# Patient Record
Sex: Male | Born: 1955
Health system: Southern US, Community
[De-identification: ages and names within clinical notes are randomized; demographics above are authoritative.]

## PROBLEM LIST (undated history)

## (undated) DIAGNOSIS — F319 Bipolar disorder, unspecified: Secondary | ICD-10-CM

## (undated) DIAGNOSIS — R0600 Dyspnea, unspecified: Secondary | ICD-10-CM

## (undated) DIAGNOSIS — G20A1 Parkinson's disease without dyskinesia, without mention of fluctuations: Secondary | ICD-10-CM

## (undated) DIAGNOSIS — K759 Inflammatory liver disease, unspecified: Secondary | ICD-10-CM

## (undated) HISTORY — PX: CLAVICLE SURGERY: SHX598

## (undated) HISTORY — DX: Bipolar disorder, unspecified: F31.9

## (undated) HISTORY — PX: NECK SURGERY: SHX720

## (undated) HISTORY — PX: BACK SURGERY: SHX140

---

## 2000-07-14 ENCOUNTER — Encounter: Payer: Self-pay | Admitting: *Deleted

## 2000-07-14 ENCOUNTER — Ambulatory Visit (HOSPITAL_COMMUNITY): Admission: RE | Admit: 2000-07-14 | Discharge: 2000-07-14 | Payer: Self-pay | Admitting: *Deleted

## 2001-04-14 ENCOUNTER — Encounter: Payer: Self-pay | Admitting: Internal Medicine

## 2001-04-14 ENCOUNTER — Ambulatory Visit (HOSPITAL_COMMUNITY): Admission: RE | Admit: 2001-04-14 | Discharge: 2001-04-14 | Payer: Self-pay | Admitting: Internal Medicine

## 2001-05-03 ENCOUNTER — Encounter: Payer: Self-pay | Admitting: Emergency Medicine

## 2001-05-04 ENCOUNTER — Encounter: Payer: Self-pay | Admitting: Internal Medicine

## 2001-05-04 ENCOUNTER — Observation Stay (HOSPITAL_COMMUNITY): Admission: EM | Admit: 2001-05-04 | Discharge: 2001-05-04 | Payer: Self-pay | Admitting: Internal Medicine

## 2002-07-21 ENCOUNTER — Encounter: Payer: Self-pay | Admitting: Emergency Medicine

## 2002-07-21 ENCOUNTER — Emergency Department (HOSPITAL_COMMUNITY): Admission: AC | Admit: 2002-07-21 | Discharge: 2002-07-21 | Payer: Self-pay

## 2002-08-08 ENCOUNTER — Emergency Department (HOSPITAL_COMMUNITY): Admission: EM | Admit: 2002-08-08 | Discharge: 2002-08-08 | Payer: Self-pay | Admitting: Emergency Medicine

## 2002-09-14 ENCOUNTER — Emergency Department (HOSPITAL_COMMUNITY): Admission: EM | Admit: 2002-09-14 | Discharge: 2002-09-15 | Payer: Self-pay | Admitting: Emergency Medicine

## 2003-12-03 ENCOUNTER — Ambulatory Visit (HOSPITAL_COMMUNITY): Admission: RE | Admit: 2003-12-03 | Discharge: 2003-12-03 | Payer: Self-pay | Admitting: Family Medicine

## 2004-01-01 ENCOUNTER — Ambulatory Visit (HOSPITAL_COMMUNITY): Admission: RE | Admit: 2004-01-01 | Discharge: 2004-01-01 | Payer: Self-pay | Admitting: Unknown Physician Specialty

## 2004-01-28 ENCOUNTER — Encounter (HOSPITAL_COMMUNITY): Admission: RE | Admit: 2004-01-28 | Discharge: 2004-02-27 | Payer: Self-pay | Admitting: Unknown Physician Specialty

## 2005-10-14 ENCOUNTER — Ambulatory Visit (HOSPITAL_COMMUNITY): Admission: RE | Admit: 2005-10-14 | Discharge: 2005-10-15 | Payer: Self-pay | Admitting: Neurosurgery

## 2005-11-17 ENCOUNTER — Encounter (HOSPITAL_COMMUNITY): Admission: RE | Admit: 2005-11-17 | Discharge: 2005-11-20 | Payer: Self-pay | Admitting: Neurosurgery

## 2005-11-22 ENCOUNTER — Encounter (HOSPITAL_COMMUNITY): Admission: RE | Admit: 2005-11-22 | Discharge: 2005-12-22 | Payer: Self-pay | Admitting: Neurosurgery

## 2006-06-28 ENCOUNTER — Ambulatory Visit (HOSPITAL_COMMUNITY): Admission: RE | Admit: 2006-06-28 | Discharge: 2006-06-28 | Payer: Self-pay | Admitting: Neurosurgery

## 2009-08-04 ENCOUNTER — Ambulatory Visit (HOSPITAL_COMMUNITY): Admission: RE | Admit: 2009-08-04 | Discharge: 2009-08-04 | Payer: Self-pay | Admitting: Family Medicine

## 2009-08-28 ENCOUNTER — Ambulatory Visit (HOSPITAL_COMMUNITY): Admission: RE | Admit: 2009-08-28 | Discharge: 2009-08-28 | Payer: Self-pay | Admitting: Internal Medicine

## 2010-03-15 ENCOUNTER — Encounter: Payer: Self-pay | Admitting: Unknown Physician Specialty

## 2010-07-10 NOTE — Op Note (Signed)
NAME:  [name], [name]              ACCOUNT NO.:  [id number]   MEDICAL RECORD NO.:  [id number]          PATIENT TYPE:  AMB   LOCATION:  SDS                          FACILITY:  [facility]   PHYSICIAN:  Reinaldo Meeker, M.D. DATE OF BIRTH:  09-Dec-1955   DATE OF PROCEDURE:  06/28/2006  DATE OF DISCHARGE:                               OPERATIVE REPORT   PREOPERATIVE DIAGNOSIS:  Herniated disk L5-S1 right, recurrent.   POSTOPERATIVE DIAGNOSIS:  Herniated disk L5-S1 right, recurrent.   PROCEDURE:  Right L5-S1 redo microdiskectomy.   SURGEON:  Reinaldo Meeker, M.D.   ASSISTANT:  Dr. Julio Sicks.   PROCEDURE IN DETAIL:  After being placed in the prone position, the  patient's back was prepped and draped in the usual sterile fashion.  Previous lumbar incision was reopened, extended slightly and carried  down the spinous processes.  Subperiosteal dissection was then carried  out on the right-side of spinous process and lamina facet joint, self-  retaining tract was placed for exposure.  X-ray showed approach of the  appropriate level.  Edges of the previous laminotomy were identified and  scar tissue scraped away from the lamina with a straight curette.  The  laminotomy was then enlarged by removing additional segments of the L5  lamina, medial facet joint and the superior one half of the S1 lamina.  The S1 nerve root could be identified going around the pedicle at its  foramen.  Additional scar tissue was then removed.  At this time, the  microscope was draped, brought into the field and used for the remainder  of the case.  Using microsection technique, the pedicle of S1 was  identified and dissection was carried out further where the disk space  could be entered.  A thin straight pituitary was used to remove some of  the initial disk material and then hook and larger pituitaries were  used.  A very large fragment of disk material was then removed and gave  excellent decompression of the  thecal sac and S1 nerve root.  Additional  disk space clean-out was carried out as well as inspection inferior for  any residual fragment, none could be identified.  The disk space was now  thoroughly cleaned out and there was no evidence of residual material  and no evidence of residual compression.  The wound was then irrigated  copiously with antibiotic irrigation.  Any bleeding was controlled with  bipolar coagulation and Gelfoam.  The wound was then closed in multiple  layers of Vicryl in the muscle fascia, subcutaneous and subcuticular  tissues and staples were placed on the skin.  Sterile dressing was then  applied.  The patient was extubated and taken to the recovery room in  stable condition.           ______________________________  Reinaldo Meeker, M.D.     ROK/MEDQ  D:  06/28/2006  T:  06/28/2006  Job:  [id number]

## 2010-07-10 NOTE — Op Note (Signed)
NAME:  Bobby York, Bobby York NO.:  [id number]   MEDICAL RECORD NO.:  [id number]          PATIENT TYPE:  OIB   LOCATION:  3030                         FACILITY:  [facility]   PHYSICIAN:  Reinaldo Meeker, M.D. DATE OF BIRTH:  03-Aug-1955   DATE OF PROCEDURE:  DATE OF DISCHARGE:                                 OPERATIVE REPORT   PREOPERATIVE DIAGNOSIS:  Herniated disk, L5-S1, right with inferior  fragment.   POSTOPERATIVE DIAGNOSIS:  Herniated disk, L5-S1, right with inferior  fragment.   PROCEDURE:  Right L5-S1 interlaminar laminotomy for excision of herniated  disk with operative microscope.   SURGEON:  Reinaldo Meeker, M.D.   ASSISTANT:  Dr. Joycelyn Schmid   PROCEDURE IN DETAIL:  After being placed in the prone position, the  patient's back was prepped and draped in usual sterile fashion.  Localizing  x-rays were taken prior to incision to identify the appropriate level.  A  midline incision was made by the spinous process of L5 and S1.  Using the  Bovie cautery and a curette, the incision was carried down to the spinous  processes.  Subperiosteal dissection was then carried out on the right side  of the spinous processes and laminae, and a self-retaining retractor was  placed for exposure.  X-rays showed approach at the appropriate level.  Using the high-speed drill, the inferior one-third of the L5 lamina and  medial third of the facet joint were removed.  The drill was then used to  remove the superior one-half of the S1 lamina.  Residual bone and ligamentum  flavum were removed in a piecemeal fashion.  The microscope was draped,  brought into the field and used for the remainder of the case.  Using  microdissection technique, the lateral aspect of the thecal sac and S1 nerve  were identified.  Prior to incising the disk, however, it was elected to  explore the axilla of the S1 nerve and large fragments of disk material were  identified, freed up and removed in a  piecemeal fashion.  A very thorough  disk space __________ was then carried out by incising the annulus and  thoroughly cleaning out with pituitary rongeurs and curettes.  Dissection  was then carried out in the axilla once more and any residual disk material  was removed to complete the decompression.  By this time the thecal sac and  the S1 nerve root were completely decompressed, there was no evidence of  residual disk material.  Large amounts of irrigation were carried out at  this time and any bleeding controlled with bipolar coagulation.  The wound  was then closed in multiple layers of Vicryl in the muscle, fascia,  subcutaneous and subcuticular tissues, and staples on the skin.  A sterile  dressing was then applied, the patient was extubated, taken to recovery room  in stable condition.           ______________________________  Reinaldo Meeker, M.D.     ROK/MEDQ  D:  10/14/2005  T:  10/15/2005  Job:  [id number]

## 2010-07-10 NOTE — H&P (Signed)
[place]  Patient:    Bobby York, Bobby York Visit Number: [id number] MRN: [id number]          Service Type: EMS Location: ED Attending Physician:  Herbert Seta Dictated by:   Nathen May, M.D., [facility] Admit Date:  05/03/2001 Discharge Date: 05/03/2001   CC:         Elfredia Nevins, M.D.   History and Physical  HISTORY OF PRESENT ILLNESS:  Kengo Sturges is a 55 year old African-American gentleman referred for further evaluation of chest pain.  Mr. Saye has had a URI syndrome for the last 7-10 days manifested by rhinorrhea, intermittent cough.  He saw his primary physician earlier this week for chest discomfort that had been ongoing on and off for the last week, typically characterized by brief three to five second episodes of pain with full resolution between.  It was felt to be an inflammation of the chest wall (? costochondritis) and was given a sinus medication.  However, his symptoms persisted.  He was seen in the office this morning because of this and was started on Levaquin.  It was noted that his blood pressure was up this week and it had been last week, and he was started on a blood pressure pill which is not known, and he went to work.  However, this evening about 8 p.m. a similar type of pain came and persisted.  It was described as relatively sharp, aggravated by breathing, without radiation.  There is some shortness of breath and diaphoresis.  He then, because of persistence, presented himself to the emergency room.  He was noted to be normotensive.  His electrocardiogram showed a T wave inversion inferiorly and he was referred for evaluation.  He was given nitroglycerin and Lovenox and en route received MS which was the most helpful for his symptoms.  His risk factors are notable for his questionable history of hypertension. There is a strong family history involving his maternal aunts and uncles and grandmother -  although his mother has no known coronary disease.  He smokes cigarettes.  His lipid status is not known.  He also notes that he has had exercise intolerance that is some shortness of breath with the stairs at home over the last couple of months.  He is an admitted ______ so does not have significant exertional complaints.  He denies orthopnea, nocturnal dyspnea, or peripheral edema.  He does have a history of occasional tachypalpitations, episodes lasting about a minute, unassociated with shortness of breath, chest pain, or lightheadedness.  He has no syncope.  PAST MEDICAL HISTORY:  Notable for hepatitis C for which he underwent a liver biopsy this fall.  He is under the care of Dr. Kendell Bane for this.  There is anticipation of some therapy, question Interferon, for this.  There are no other surgeries.  REVIEW OF SYSTEMS:  Is otherwise noncontributory.  SOCIAL HISTORY:  He is married.  He has a 33 year old son.  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  He does not use alcohol or recreational drugs.  He does smoke.  PHYSICAL EXAMINATION:  GENERAL:  He is a middle-aged African-American male, appearing somewhat anxious but in no acute distress.  VITAL SIGNS:  Blood pressure 117/53 and pulse 74, respirations were 16 and unlabored.  HEENT:  Demonstrated no scleral icterus or xanthomata.  The neck veins were flat and the carotids were brisk and full bilaterally without bruits.  There is no lymphadenopathy.  BACK:  Without kyphosis or scoliosis.  LUNGS:  Clear.  CHEST:  The heart sounds were regular without murmurs or gallops.  There was tenderness along the costochondral junction, though this was a different discomfort from his presenting complaint.  ABDOMEN:  Soft with active bowel sounds without hepatomegaly or midline pulsation.  EXTREMITIES:  Femoral pulses were 2+.  Distal pulses were intact.  There was no clubbing, cyanosis, or  edema.  NEUROLOGIC:  Grossly normal.  LABORATORY DATA:  Electrocardiograms obtained at [facility] and here are similar, demonstrating sinus rhythm at 69 with intervals of 0.14/0.08/0.3 with an axis of 75 degrees.  There is a negative to flattened T wave in III which was more prominently negative on the first electrocardiogram, associated with some modest ST segment depression.  IMPRESSION: 1. Atypical chest pain. 2. Abnormal electrocardiogram with transient T wave inversion in lead III and    mild ST segment depression of 0.5 mm in III and F, which improved. 3. Cardiac risk factors notable for question of hypertension, positive family    history, positive cigarettes, question lipids. 4. Upper respiratory infection for the last week or two with sinus drainage    and cough and chest wall tenderness. 5. Mild dyspnea on exertion x1-2 months. 6. Hepatitis C status post liver biopsy with impending therapy.  DISCUSSION:  Mr. Huntsberry has atypical chest pain with a modestly abnormal electrocardiogram in the setting of multiple cardiac risk factors.  However, while this occurs in the setting of a URI/sinus infection, this both makes the overall syndrome less likely to be cardiac on the one hand; however, inflammatory state may be provocative for an acute coronary syndrome.  Given the aforementioned differential, I think an ongoing broad-based approach is necessary, both diagnostically and potentially therapeutically.  We will continue him on his antibiotics.  We will continue him on anti-inflammatory drugs with a modest dose aspirin and continue him on his heparin.  We will obtain serial enzymes and make a disposition regarding further evaluation of his coronary disease based on the results of those.  We will also obtain fasting lipids. Dictated by:   Nathen May, M.D., [facility] Attending Physician:  Herbert Seta DD:  05/04/01 TD:  05/04/01 Job: [id number]

## 2010-12-09 ENCOUNTER — Other Ambulatory Visit (HOSPITAL_COMMUNITY): Payer: Self-pay | Admitting: Physician Assistant

## 2010-12-09 DIAGNOSIS — M25519 Pain in unspecified shoulder: Secondary | ICD-10-CM

## 2010-12-09 DIAGNOSIS — M5030 Other cervical disc degeneration, unspecified cervical region: Secondary | ICD-10-CM

## 2010-12-11 ENCOUNTER — Ambulatory Visit (HOSPITAL_COMMUNITY): Payer: BC Managed Care – PPO

## 2010-12-15 ENCOUNTER — Ambulatory Visit (HOSPITAL_COMMUNITY)
Admission: RE | Admit: 2010-12-15 | Discharge: 2010-12-15 | Disposition: A | Discharge status: Home / Self Care | Payer: BC Managed Care – PPO | Source: Ambulatory Visit | Attending: Physician Assistant | Admitting: Physician Assistant

## 2010-12-15 DIAGNOSIS — M5380 Other specified dorsopathies, site unspecified: Secondary | ICD-10-CM | POA: Insufficient documentation

## 2010-12-15 DIAGNOSIS — M25519 Pain in unspecified shoulder: Secondary | ICD-10-CM

## 2010-12-15 DIAGNOSIS — M542 Cervicalgia: Secondary | ICD-10-CM | POA: Insufficient documentation

## 2010-12-15 DIAGNOSIS — M5030 Other cervical disc degeneration, unspecified cervical region: Secondary | ICD-10-CM | POA: Insufficient documentation

## 2014-04-17 ENCOUNTER — Ambulatory Visit (HOSPITAL_COMMUNITY)
Admission: RE | Admit: 2014-04-17 | Discharge: 2014-04-17 | Disposition: A | Discharge status: Home / Self Care | Payer: BLUE CROSS/BLUE SHIELD | Source: Ambulatory Visit | Attending: Family Medicine | Admitting: Family Medicine

## 2014-04-17 ENCOUNTER — Other Ambulatory Visit (HOSPITAL_COMMUNITY): Payer: Self-pay | Admitting: Family Medicine

## 2014-04-17 DIAGNOSIS — R0789 Other chest pain: Secondary | ICD-10-CM | POA: Insufficient documentation

## 2014-04-17 DIAGNOSIS — R634 Abnormal weight loss: Secondary | ICD-10-CM

## 2014-04-17 DIAGNOSIS — F17200 Nicotine dependence, unspecified, uncomplicated: Secondary | ICD-10-CM

## 2014-05-28 ENCOUNTER — Encounter: Payer: Self-pay | Admitting: Cardiovascular Disease

## 2014-05-28 ENCOUNTER — Ambulatory Visit (INDEPENDENT_AMBULATORY_CARE_PROVIDER_SITE_OTHER): Payer: BLUE CROSS/BLUE SHIELD | Admitting: Cardiovascular Disease

## 2014-05-28 VITALS — BP 126/80 | HR 66 | Ht 71.0 in | Wt 171.0 lb

## 2014-05-28 DIAGNOSIS — Z716 Tobacco abuse counseling: Secondary | ICD-10-CM | POA: Diagnosis not present

## 2014-05-28 DIAGNOSIS — R9431 Abnormal electrocardiogram [ECG] [EKG]: Secondary | ICD-10-CM | POA: Diagnosis not present

## 2014-05-28 NOTE — Progress Notes (Signed)
Patient ID: Bobby York, male   DOB: [birth year], 59 y.o.   MRN: [id number]       CARDIOLOGY CONSULT NOTE  Patient ID: Bobby York MRN: [id number] DOB/AGE: 02-15-1956 59 y.o.  Admit date: (Not on file) Primary Physician Glo Herring., MD  Reason for Consultation: abnormal ECG  HPI: The patient is a 59 year old male with no significant cardiac history who is referred for the evaluation of an ECG which was reportedly abnormal. ECG demonstrates sinus tachycardia with a nonspecific T wave abnormality. He smokes about 3 or 4 cigarettes daily and has done so since the age of 70. He drinks 2 beers every day. He denies exertional chest pain and exertional dyspnea. He also denies palpitations, orthopnea, leg swelling, paroxysmal nocturnal dyspnea, and syncope. His mother passed away at the age of 21 and received a pacemaker in her mid to late 27s. He denies abdominal pain. He occasionally has dysphagia for tablets.    No Known Allergies  Current Outpatient Prescriptions  Medication Sig Dispense Refill  . ALPRAZolam (XANAX) 1 MG tablet Take 1 mg by mouth at bedtime.    . ARIPiprazole (ABILIFY) 2 MG tablet Take 2 mg by mouth daily.    Marland Kitchen buPROPion (WELLBUTRIN XL) 300 MG 24 hr tablet Take 300 mg by mouth daily.    . naltrexone (DEPADE) 50 MG tablet Take 50 mg by mouth daily.     No current facility-administered medications for this visit.    No past medical history on file.  No past surgical history on file.  History   Social History  . Marital Status: Married    Spouse Name: N/A  . Number of Children: N/A  . Years of Education: N/A   Occupational History  . Not on file.   Social History Main Topics  . Smoking status: Current Every Day Smoker -- 0.02 packs/day    Types: Cigarettes    Start date: 05/28/1970  . Smokeless tobacco: Never Used  . Alcohol Use: Not on file  . Drug Use: Not on file  . Sexual Activity: Not on file   Other Topics Concern  . Not on file     Social History Narrative  . No narrative on file     No family history of premature CAD in 1st degree relatives.  Prior to Admission medications   Medication Sig Start Date End Date Taking? Authorizing Provider  ALPRAZolam Duanne Moron) 1 MG tablet Take 1 mg by mouth at bedtime.   Yes Historical Provider, MD  ARIPiprazole (ABILIFY) 2 MG tablet Take 2 mg by mouth daily.   Yes Historical Provider, MD  buPROPion (WELLBUTRIN XL) 300 MG 24 hr tablet Take 300 mg by mouth daily.   Yes Historical Provider, MD  naltrexone (DEPADE) 50 MG tablet Take 50 mg by mouth daily.   Yes Historical Provider, MD     Review of systems complete and found to be negative unless listed above in HPI     Physical exam Blood pressure 126/80, pulse 66, height 5\' 11"  (1.803 m), weight 171 lb (77.565 kg), SpO2 96 %. General: NAD Neck: No JVD, no thyromegaly or thyroid nodule.  Lungs: Clear to auscultation bilaterally with normal respiratory effort. CV: Nondisplaced PMI. Regular rate and rhythm, normal S1/S2, no S3/S4, no murmur.  No peripheral edema.  No carotid bruit.  Normal pedal pulses.  Abdomen: Soft, nontender, no hepatosplenomegaly, no distention.  Skin: Intact without lesions or rashes.  Neurologic: Alert and oriented x 3.  Psych: Normal  affect. Extremities: No clubbing or cyanosis.  HEENT: Normal.   ECG: Most recent ECG reviewed.  Labs:  No results found for: WBC, HGB, HCT, MCV, PLT No results for input(s): NA, K, CL, CO2, BUN, CREATININE, CALCIUM, PROT, BILITOT, ALKPHOS, ALT, AST, GLUCOSE in the last 168 hours.  Invalid input(s): LABALBU No results found for: CKTOTAL, CKMB, CKMBINDEX, TROPONINI No results found for: CHOL No results found for: HDL No results found for: LDLCALC No results found for: TRIG No results found for: CHOLHDL No results found for: LDLDIRECT       Studies: No results found.  ASSESSMENT AND PLAN:  1. Abnormal ECG: Today he is in a regular rhythm with a normal heart  rate. As mentioned previously, the ECG demonstrated sinus tachycardia with a nonspecific T wave abnormality. Given the absence of exertional chest pain and exertional dyspnea with no specific cardiovascular symptoms per se, I do not see any indication to proceed with noninvasive cardiac evaluation. Should he begin to experience symptoms which are concerning for ischemic heart disease, this can be reinvestigated.  2. Tobacco abuse: Cessation counseling provided. His wife agrees.   Signed: Kate Sable, M.D., F.A.C.C.  05/28/2014, 1:27 PM

## 2014-05-28 NOTE — Patient Instructions (Signed)
Your physician recommends that you schedule a follow-up appointment in: only if needed    Thank you for choosing [facility] !

## 2014-06-17 ENCOUNTER — Encounter: Payer: Self-pay | Admitting: Internal Medicine

## 2014-06-19 ENCOUNTER — Emergency Department (HOSPITAL_COMMUNITY)
Admission: EM | Admit: 2014-06-19 | Discharge: 2014-06-19 | Disposition: A | Discharge status: Home / Self Care | Payer: BLUE CROSS/BLUE SHIELD | Attending: Emergency Medicine | Admitting: Emergency Medicine

## 2014-06-19 ENCOUNTER — Emergency Department (HOSPITAL_COMMUNITY): Payer: BLUE CROSS/BLUE SHIELD

## 2014-06-19 ENCOUNTER — Encounter (HOSPITAL_COMMUNITY): Payer: Self-pay | Admitting: Emergency Medicine

## 2014-06-19 DIAGNOSIS — F410 Panic disorder [episodic paroxysmal anxiety] without agoraphobia: Secondary | ICD-10-CM | POA: Diagnosis present

## 2014-06-19 DIAGNOSIS — R079 Chest pain, unspecified: Secondary | ICD-10-CM | POA: Insufficient documentation

## 2014-06-19 DIAGNOSIS — R110 Nausea: Secondary | ICD-10-CM | POA: Insufficient documentation

## 2014-06-19 DIAGNOSIS — Z79899 Other long term (current) drug therapy: Secondary | ICD-10-CM | POA: Insufficient documentation

## 2014-06-19 DIAGNOSIS — Z720 Tobacco use: Secondary | ICD-10-CM | POA: Diagnosis not present

## 2014-06-19 DIAGNOSIS — R0602 Shortness of breath: Secondary | ICD-10-CM | POA: Diagnosis not present

## 2014-06-19 DIAGNOSIS — R42 Dizziness and giddiness: Secondary | ICD-10-CM | POA: Insufficient documentation

## 2014-06-19 LAB — I-STAT CHEM 8, ED
BUN: 13 mg/dL (ref 6–23)
CHLORIDE: 105 mmol/L (ref 96–112)
Calcium, Ion: 1.07 mmol/L — ABNORMAL LOW (ref 1.12–1.23)
Creatinine, Ser: 1.3 mg/dL (ref 0.50–1.35)
Glucose, Bld: 103 mg/dL — ABNORMAL HIGH (ref 70–99)
HEMATOCRIT: 47 % (ref 39.0–52.0)
Hemoglobin: 16 g/dL (ref 13.0–17.0)
POTASSIUM: 4.1 mmol/L (ref 3.5–5.1)
SODIUM: 139 mmol/L (ref 135–145)
TCO2: 21 mmol/L (ref 0–100)

## 2014-06-19 LAB — I-STAT TROPONIN, ED
Troponin i, poc: 0 ng/mL (ref 0.00–0.08)
Troponin i, poc: 0 ng/mL (ref 0.00–0.08)

## 2014-06-19 MED ORDER — ASPIRIN 81 MG PO CHEW
324.0000 mg | CHEWABLE_TABLET | Freq: Once | ORAL | Status: AC
  Administered 2014-06-19: 324 mg via ORAL
  Filled 2014-06-19: qty 4

## 2014-06-19 MED ORDER — LORAZEPAM 1 MG PO TABS
2.0000 mg | ORAL_TABLET | Freq: Once | ORAL | Status: AC
Start: 2014-06-19 — End: 2014-06-19
  Administered 2014-06-19: 2 mg via ORAL
  Filled 2014-06-19: qty 2

## 2014-06-19 NOTE — ED Provider Notes (Signed)
CSN: [id number]     Arrival date & time 06/19/14  1118 History  This chart was scribed for Ripley Fraise, MD by Mercy Moore, ED scribe.  This patient was seen in room APA18/APA18 and the patient's care was started at 12:30 PM.   Chief Complaint  Patient presents with  . Panic Attack   Patient is a 59 y.o. male presenting with anxiety. The history is provided by the patient. No language interpreter was used.  Anxiety Associated symptoms include chest pain and shortness of breath. Pertinent negatives include no abdominal pain.   HPI Comments: Bobby York is a 59 y.o. male who presents to the Emergency Department complaining of shortness of breath since yesterday and reports intermittent, fleeting sharp chest pain since yesterday. Patient reports associated lightheadedness, nausea, and increased anxiety. Patient denies history of similar symptoms. Patient reports history of anxiety and depression; wife reports that patient has been without his medication for two days now.    Patient is an admitted smoker. Patient denies history of PE/DVTs, Diabetes. Patient's wife reports fluctuating blood pressure, but the patient has not been prescribed medication.    PMH - none History  Substance Use Topics  . Smoking status: Current Every Day Smoker -- 0.02 packs/day    Types: Cigarettes    Start date: 05/28/1970  . Smokeless tobacco: Never Used  . Alcohol Use: 0.0 oz/week    0 Standard drinks or equivalent per week    Review of Systems  Constitutional: Negative for fever.  Respiratory: Positive for shortness of breath.   Cardiovascular: Positive for chest pain.  Gastrointestinal: Positive for nausea. Negative for vomiting and abdominal pain.  Neurological: Positive for light-headedness. Negative for syncope.  Psychiatric/Behavioral: The patient is nervous/anxious.   All other systems reviewed and are negative.     Allergies  Review of patient's allergies indicates no known  allergies.  Home Medications   Prior to Admission medications   Medication Sig Start Date End Date Taking? Authorizing Provider  ALPRAZolam Duanne Moron) 1 MG tablet Take 1 mg by mouth at bedtime.   Yes Historical Provider, MD  buPROPion (WELLBUTRIN XL) 300 MG 24 hr tablet Take 300 mg by mouth daily.   Yes Historical Provider, MD  naltrexone (DEPADE) 50 MG tablet Take 50 mg by mouth daily.   Yes Historical Provider, MD   Triage Vitals: BP 152/101 mmHg  Pulse 80  Temp(Src) 98 F (36.7 C) (Oral)  Resp 18  Ht 5\' 11"  (1.803 m)  Wt 172 lb (78.019 kg)  BMI 24.00 kg/m2  SpO2 100% Physical Exam  Nursing note and vitals reviewed. CONSTITUTIONAL: Well developed/well nourished HEAD: Normocephalic/atraumatic EYES: EOMI/PERRL ENMT: Mucous membranes moist NECK: supple no meningeal signs SPINE/BACK:entire spine nontender CV: S1/S2 noted, no murmurs/rubs/gallops noted LUNGS: Lungs are clear to auscultation bilaterally, hyperventilating ABDOMEN: soft, nontender, no rebound or guarding, bowel sounds noted throughout abdomen GU:no cva tenderness NEURO: Pt is awake/alert/appropriate, moves all extremitiesx4.  No facial droop.   EXTREMITIES: pulses normal/equal, full ROM; no calf tenderness or edema SKIN: warm, color normal PSYCH: anxious and tearful   ED Course  Procedures  Medications  aspirin chewable tablet 324 mg (324 mg Oral Given 06/19/14 1243)  LORazepam (ATIVAN) tablet 2 mg (2 mg Oral Given 06/19/14 1243)      COORDINATION OF CARE: 12:35 PM- Discussed treatment plan with patient at bedside and patient agreed to plan.  HEART SCORE - 3 HE IS WELL APPEARING  HE IS IMPROVED INITIALLY SEEMED VERY ANXIOUS - HYPERVENTILATING  CP WAS VERY ATYPICAL EKG UNCHANGED FROM PRIOR REPEAT TROPONIN NEGATIVE HE WOULD LIKE TO GO HOME HE HAS ALREADY SEEN CARDIOLOGY EARLIER THIS MONTH WE DISCUSSED STRICT RETURN PRECAUTIONS  Labs Review Labs Reviewed  I-STAT CHEM 8, ED - Abnormal; Notable for the  following:    Glucose, Bld 103 (*)    Calcium, Ion 1.07 (*)    All other components within normal limits  I-STAT TROPOININ, ED  Randolm Idol, ED    Imaging Review Dg Chest 2 View  06/19/2014   CLINICAL DATA:  Anxiety and difficulty breathing for 1 day. Chest pain.  EXAM: CHEST  2 VIEW  COMPARISON:  April 17, 2014  FINDINGS: Lungs are clear. Heart size and pulmonary vascularity are normal. No adenopathy. No pneumothorax. No bone lesions.  IMPRESSION: No edema or consolidation.   Electronically Signed   By: Lowella Grip III M.D.   On: 06/19/2014 12:21     EKG Interpretation None      MDM   Final diagnoses:  Acute chest pain    Nursing notes including past medical history and social history reviewed and considered in documentation xrays/imaging reviewed by myself and considered during evaluation Labs/vital reviewed myself and considered during evaluation    I personally performed the services described in this documentation, which was scribed in my presence. The recorded information has been reviewed and is accurate.      Ripley Fraise, MD 06/19/14 [phone]

## 2014-06-19 NOTE — Discharge Instructions (Signed)

## 2014-06-19 NOTE — ED Notes (Signed)
Pt reports sob,anixtey since yesterday. Pt denies any pain. Moderate hyperventilating noted in triage.

## 2014-06-19 NOTE — ED Notes (Signed)
Patient transported to X-ray 

## 2014-07-09 ENCOUNTER — Other Ambulatory Visit: Payer: Self-pay

## 2014-07-09 ENCOUNTER — Encounter: Payer: Self-pay | Admitting: Gastroenterology

## 2014-07-09 ENCOUNTER — Encounter (INDEPENDENT_AMBULATORY_CARE_PROVIDER_SITE_OTHER): Payer: Self-pay

## 2014-07-09 ENCOUNTER — Ambulatory Visit (INDEPENDENT_AMBULATORY_CARE_PROVIDER_SITE_OTHER): Payer: BLUE CROSS/BLUE SHIELD | Admitting: Gastroenterology

## 2014-07-09 VITALS — BP 127/93 | HR 75 | Temp 96.2°F | Ht 71.0 in | Wt 167.6 lb

## 2014-07-09 DIAGNOSIS — F1010 Alcohol abuse, uncomplicated: Secondary | ICD-10-CM | POA: Diagnosis not present

## 2014-07-09 DIAGNOSIS — F1011 Alcohol abuse, in remission: Secondary | ICD-10-CM

## 2014-07-09 DIAGNOSIS — R634 Abnormal weight loss: Secondary | ICD-10-CM

## 2014-07-09 DIAGNOSIS — R1314 Dysphagia, pharyngoesophageal phase: Secondary | ICD-10-CM

## 2014-07-09 DIAGNOSIS — R1319 Other dysphagia: Secondary | ICD-10-CM

## 2014-07-09 DIAGNOSIS — B182 Chronic viral hepatitis C: Secondary | ICD-10-CM | POA: Diagnosis not present

## 2014-07-09 DIAGNOSIS — R1310 Dysphagia, unspecified: Secondary | ICD-10-CM

## 2014-07-09 MED ORDER — PEG 3350-KCL-NA BICARB-NACL 420 G PO SOLR: 4000.0000 mL | Freq: Once | ORAL | Status: DC

## 2014-07-09 NOTE — Patient Instructions (Signed)
Colonoscopy and upper endoscopy as scheduled. See separate instructions.   Consume soft foods only until your procedure.   I will review your hepatitis labs once available and we will start process for treatment approval. This can take several weeks.  Call with any medication changes whether prescription or over the counter!
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
## 2014-07-12 NOTE — Assessment & Plan Note (Addendum)
Recent onset dysphagia/odynophagia to pills, food. Unable to swallow most of his pills at this time, which her psychiatric medications. He has placed a phone call to his psychiatrist for further recommendations/alternatives so that he can be medicated. Recommend EGD with dilation with deep sedation in the OR given polypharmacy.  I have discussed the risks, alternatives, benefits with regards to but not limited to the risk of reaction to medication, bleeding, infection, perforation and the patient is agreeable to proceed. Written consent to be obtained.

## 2014-07-12 NOTE — Assessment & Plan Note (Signed)
Weight loss, and 15 pounds over the past one year. No prior colonoscopy. Recommend colonoscopy family is a screening maneuver in the near future. He will also be getting an upper endoscopy for dysphagia. Weight loss also to be evaluated at time of ultrasound the liver to rule out cirrhosis/hepatoma. If no explanation for weight loss as determined with the studies, he may need a CT of the abdomen and pelvis to complete workup from a GI standpoint. He is already had a chest x-ray with no indication of explanation for weight loss.    Colonoscopy with deep sedation due to polypharmacy.  I have discussed the risks, alternatives, benefits with regards to but not limited to the risk of reaction to medication, bleeding, infection, perforation and the patient is agreeable to proceed. Written consent to be obtained.

## 2014-07-12 NOTE — Assessment & Plan Note (Signed)
History of chronic hepatitis C, genotype unknown to me at this point, records from previous workup and treatment unavailable. Received treatment over a decade ago, states he was unable to complete therapy due to side effects. Status of the extent of liver disease is unknown at this time. His LFTs are good.  Discussed with patient. We first need to see his HCV RNA level to confirm that it is positive. If so he will need to have further evaluation prior to getting him approved for HCV treatment. This will include ultrasound with elastography, genotype, PT/INR. Discussed at length with patient and his wife today. Insurance company will not approve people who are actively using illicit drugs and to cannot demonstrate compliance. Recommend he continue to avoid cocaine, he has been abstinent for 2 months now. Continue to encourage cutting back on alcohol consumption, he has a history of previous abuse but has been able to cut back to 2-3 drinks a day. Further recommendations to follow.

## 2014-07-18 NOTE — Progress Notes (Signed)
CC'ED TO PCP 

## 2014-07-19 ENCOUNTER — Encounter (HOSPITAL_COMMUNITY): Payer: Self-pay

## 2014-07-19 ENCOUNTER — Encounter (HOSPITAL_COMMUNITY)
Admission: RE | Admit: 2014-07-19 | Discharge: 2014-07-19 | Disposition: A | Discharge status: Home / Self Care | Payer: BLUE CROSS/BLUE SHIELD | Source: Ambulatory Visit | Attending: Internal Medicine | Admitting: Internal Medicine

## 2014-07-19 DIAGNOSIS — Z01818 Encounter for other preprocedural examination: Secondary | ICD-10-CM | POA: Insufficient documentation

## 2014-07-19 DIAGNOSIS — R1310 Dysphagia, unspecified: Secondary | ICD-10-CM | POA: Diagnosis not present

## 2014-07-19 DIAGNOSIS — R634 Abnormal weight loss: Secondary | ICD-10-CM | POA: Diagnosis not present

## 2014-07-19 HISTORY — DX: Inflammatory liver disease, unspecified: K75.9

## 2014-07-19 LAB — CBC WITH DIFFERENTIAL/PLATELET
BASOS ABS: 0 10*3/uL (ref 0.0–0.1)
Basophils Relative: 0 % (ref 0–1)
EOS PCT: 3 % (ref 0–5)
Eosinophils Absolute: 0.2 10*3/uL (ref 0.0–0.7)
HEMATOCRIT: 44.2 % (ref 39.0–52.0)
Hemoglobin: 14.8 g/dL (ref 13.0–17.0)
Lymphocytes Relative: 41 % (ref 12–46)
Lymphs Abs: 2.3 10*3/uL (ref 0.7–4.0)
MCH: 32 pg (ref 26.0–34.0)
MCHC: 33.5 g/dL (ref 30.0–36.0)
MCV: 95.5 fL (ref 78.0–100.0)
Monocytes Absolute: 0.4 10*3/uL (ref 0.1–1.0)
Monocytes Relative: 7 % (ref 3–12)
NEUTROS PCT: 49 % (ref 43–77)
Neutro Abs: 2.7 10*3/uL (ref 1.7–7.7)
Platelets: 297 10*3/uL (ref 150–400)
RBC: 4.63 MIL/uL (ref 4.22–5.81)
RDW: 12.6 % (ref 11.5–15.5)
WBC: 5.6 10*3/uL (ref 4.0–10.5)

## 2014-07-19 LAB — BASIC METABOLIC PANEL
Anion gap: 10 (ref 5–15)
BUN: 15 mg/dL (ref 6–20)
CALCIUM: 10 mg/dL (ref 8.9–10.3)
CHLORIDE: 100 mmol/L — AB (ref 101–111)
CO2: 28 mmol/L (ref 22–32)
CREATININE: 1.34 mg/dL — AB (ref 0.61–1.24)
GFR calc Af Amer: >60 mL/min (ref >60)
GFR calc non Af Amer: 56 mL/min — ABNORMAL LOW (ref >60)
Glucose, Bld: 93 mg/dL (ref 65–99)
Potassium: 4.6 mmol/L (ref 3.5–5.1)
Sodium: 138 mmol/L (ref 135–145)

## 2014-07-19 NOTE — Pre-Procedure Instructions (Signed)
Patient given information to sign up for my chart at home. 

## 2014-07-19 NOTE — Patient Instructions (Signed)
Bobby York  07/19/2014     @PREFPERIOPPHARMACY @   Your procedure is scheduled on  07/25/2014  Report to [facility] at  1215  A.M.  Call this number if you have problems the morning of surgery:  [phone]   Remember:  Do not eat food or drink liquids after midnight.  Take these medicines the morning of surgery with A SIP OF WATER  Wellbutrin,vistaril, zofran, naltrexone   Do not wear jewelry, make-up or nail polish.  Do not wear lotions, powders, or perfumes.   Do not shave 48 hours prior to surgery.  Men may shave face and neck.  Do not bring valuables to the hospital.  [facility] is not responsible for any belongings or valuables.  Contacts, dentures or bridgework may not be worn into surgery.  Leave your suitcase in the car.  After surgery it may be brought to your room.  For patients admitted to the hospital, discharge time will be determined by your treatment team.  Patients discharged the day of surgery will not be allowed to drive home.   Name and phone number of your driver:   family Special instructions:  Follow instructions from Dr Roseanne Kaufman office.  Please read over the following fact sheets that you were given. Pain Booklet, Coughing and Deep Breathing, Surgical Site Infection Prevention, Anesthesia Post-op Instructions and Care and Recovery After Surgery      Esophageal Dilatation The esophagus is the long, narrow tube which carries food and liquid from the mouth to the stomach. Esophageal dilatation is the technique used to stretch a blocked or narrowed portion of the esophagus. This procedure is used when a part of the esophagus has become so narrow that it becomes difficult, painful or even impossible to swallow. This is generally an uncomplicated form of treatment. When this is not successful, chest surgery may be required. This is a much more extensive form of treatment with a longer recovery time. CAUSES  Some of the more common causes  of blockage or strictures of the esophagus are:  Narrowing from longstanding inflammation (soreness and redness) of the lower esophagus. This comes from the constant exposure of the lower esophagus to the acid which bubbles up from the stomach. Over time this causes scarring and narrowing of the lower esophagus.  Hiatal hernia in which a small part of the stomach bulges (herniates) up through the diaphragm. This can cause a gradual narrowing of the end of the esophagus.  Schatzki ring is a narrow ring of benign (non-cancerous) fibrous tissue which constricts the lower esophagus. The reason for this is not known.  Scleroderma is a connective tissue disorder that affects the esophagus and makes swallowing difficult.  Achalasia is an absence of nerves to the lower esophagus and to the esophageal sphincter. This is the circular muscle between the stomach and esophagus that relaxes to allow food into the stomach. After swallowing, it contracts to keep food in the stomach. This absence of nerves may be congenital (present since birth). This can cause irregular spasms of the lower esophageal muscle. This spasm does not open up to allow food and fluid through. The result is a persistent blockage with subsequent slow trickling of the esophageal contents into the stomach.  Strictures may develop from swallowing materials which damage the esophagus. Some examples are strong acids or alkalis such as lye.  Growths such as benign (non-cancerous) and malignant (cancerous) tumors can block the esophagus.  Hereditary (present since birth) causes. DIAGNOSIS  Your caregiver often suspects this problem by taking a medical history. They will also do a physical exam. They can then prove their suspicions using X-rays and endoscopy. Endoscopy is an exam in which a tube like a small, flexible telescope is used to look at your esophagus.  TREATMENT There are different stretching (dilating) techniques that can be used.  Simple bougie dilatation may be done in the office. This usually takes only a couple minutes. A numbing (anesthetic) spray of the throat is used. Endoscopy, when done, is done in an endoscopy suite under mild sedation. When fluoroscopy is used, the procedure is performed in X-ray. Other techniques require a little longer time. Recovery is usually quick. There is no waiting time to begin eating and drinking to test success of the treatment. Following are some of the methods used. Narrowing of the esophagus is treated by making it bigger. Commonly this is a mechanical problem which can be treated with stretching. This can be done in different ways. Your caregiver will discuss these with you. Some of the means used are:  A series of graduated (increasing thickness) flexible dilators can be used. These are weighted tubes passed through the esophagus into the stomach. The tubes used become progressively larger until the desired stretched size is reached. Graduated dilators are a simple and quick way of opening the esophagus. No visualization is required.  Another method is the use of endoscopy to place a flexible wire across the stricture. The endoscope is removed and the wire left in place. A dilator with a hole through it from end to end is guided down the esophagus and across the stricture. One or more of these dilators are passed over the wire. At the end of the exam, the wire is removed. This type of treatment may be performed in the X-ray department under fluoroscopy. An advantage of this procedure is the examiner is visualizing the end opening in the esophagus.  Stretching of the esophagus may be done using balloons. Deflated balloons are placed through the endoscope and across the stricture. This type of balloon dilatation is often done at the time of endoscopy or fluoroscopy. Flexible endoscopy allows the examiner to directly view the stricture. A balloon is inserted in the deflated form into the area of  narrowing. It is then inflated with air to a certain pressure that is preset for a given circumference. When inflated, it becomes sausage shaped, stretched, and makes the stricture larger.  Achalasia requires a longer, larger balloon-type dilator. This is frequently done under X-ray control. In this situation, the spastic muscle fibers in the lower esophagus are stretched. All of the above procedures make the passage of food and water into the stomach easier. They also make it easier for stomach contents to reflux back into the esophagus. Special medications may be used following the procedure to help prevent further stricturing. Proton-pump inhibitor medications are good at decreasing the amount of acid in the stomach juice. When stomach juice refluxes into the esophagus, the juice is no longer as acidic and is less likely to burn or scar the esophagus. RISKS AND COMPLICATIONS Esophageal dilatation is usually performed effectively and without problems. Some complications that can occur are:  A small amount of bleeding almost always happens where the stretching takes place. If this is too excessive it may require more aggressive treatment.  An uncommon complication is perforation (making a hole) of the esophagus. The esophagus is thin. It is easy  to make a hole in it. If this happens, an operation may be necessary to repair this.  A small, undetected perforation could lead to an infection in the chest. This can be very serious. HOME CARE INSTRUCTIONS   If you received sedation for your procedure, do not drive, make important decisions, or perform any activities requiring your full coordination. Do not drink alcohol, take sedatives, or use any mind altering chemicals unless instructed by your caregiver.  You may use throat lozenges or warm salt water gargles if you have throat discomfort.  You can begin eating and drinking normally on return home unless instructed otherwise. Do not purposely try to  force large chunks of food down to test the benefits of your procedure.  Mild discomfort can be eased with sips of ice water.  Medications for discomfort may or may not be needed. SEEK IMMEDIATE MEDICAL CARE IF:   You begin vomiting up blood.  You develop black, tarry stools.  You develop chills or an unexplained temperature of over 101F (38.3C)  You develop chest or abdominal pain.  You develop shortness of breath, or feel light-headed or faint.  Your swallowing is becoming more painful, difficult, or you are unable to swallow. MAKE SURE YOU:   Understand these instructions.  Will watch your condition.  Will get help right away if you are not doing well or get worse. Document Released: 04/01/2005 Document Revised: 06/25/2013 Document Reviewed: 05/19/2005 [facility] Patient Information 2015 [place], [place]. This information is not intended to replace advice given to you by your health care provider. Make sure you discuss any questions you have with your health care provider. Esophagogastroduodenoscopy Esophagogastroduodenoscopy (EGD) is a procedure to examine the lining of the esophagus, stomach, and first part of the small intestine (duodenum). A long, flexible, lighted tube with a camera attached (endoscope) is inserted down the throat to view these organs. This procedure is done to detect problems or abnormalities, such as inflammation, bleeding, ulcers, or growths, in order to treat them. The procedure lasts about 5-20 minutes. It is usually an outpatient procedure, but it may need to be performed in emergency cases in the hospital. LET YOUR CAREGIVER KNOW ABOUT:   Allergies to food or medicine.  All medicines you are taking, including vitamins, herbs, eyedrops, and over-the-counter medicines and creams.  Use of steroids (by mouth or creams).  Previous problems you or members of your family have had with the use of anesthetics.  Any blood disorders you have.  Previous  surgeries you have had.  Other health problems you have.  Possibility of pregnancy, if this applies. RISKS AND COMPLICATIONS  Generally, EGD is a safe procedure. However, as with any procedure, complications can occur. Possible complications include:  Infection.  Bleeding.  Tearing (perforation) of the esophagus, stomach, or duodenum.  Difficulty breathing or not being able to breath.  Excessive sweating.  Spasms of the larynx.  Slowed heartbeat.  Low blood pressure. BEFORE THE PROCEDURE  Do not eat or drink anything for 6-8 hours before the procedure or as directed by your caregiver.  Ask your caregiver about changing or stopping your regular medicines.  If you wear dentures, be prepared to remove them before the procedure.  Arrange for someone to drive you home after the procedure. PROCEDURE   A vein will be accessed to give medicines and fluids. A medicine to relax you (sedative) and a pain reliever will be given through that access into the vein.  A numbing medicine (local anesthetic) may  be sprayed on your throat for comfort and to stop you from gagging or coughing.  A mouth guard may be placed in your mouth to protect your teeth and to keep you from biting on the endoscope.  You will be asked to lie on your left side.  The endoscope is inserted down your throat and into the esophagus, stomach, and duodenum.  Air is put through the endoscope to allow your caregiver to view the lining of your esophagus clearly.  The esophagus, stomach, and duodenum is then examined. During the exam, your caregiver may:  Remove tissue to be examined under a microscope (biopsy) for inflammation, infection, or other medical problems.  Remove growths.  Remove objects (foreign bodies) that are stuck.  Treat any bleeding with medicines or other devices that stop tissues from bleeding (hot cautery, clipping devices).  Widen (dilate) or stretch narrowed areas of the esophagus and  stomach.  The endoscope will then be withdrawn. AFTER THE PROCEDURE  You will be taken to a recovery area to be monitored. You will be able to go home once you are stable and alert.  Do not eat or drink anything until the local anesthetic and numbing medicines have worn off. You may choke.  It is normal to feel bloated, have pain with swallowing, or have a sore throat for a short time. This will wear off.  Your caregiver should be able to discuss his or her findings with you. It will take longer to discuss the test results if any biopsies were taken. Document Released: 06/11/2004 Document Revised: 06/25/2013 Document Reviewed: 01/12/2012 [facility] Patient Information 2015 [place], [place]. This information is not intended to replace advice given to you by your health care provider. Make sure you discuss any questions you have with your health care provider. Colonoscopy A colonoscopy is an exam to look at the entire large intestine (colon). This exam can help find problems such as tumors, polyps, inflammation, and areas of bleeding. The exam takes about 1 hour.  LET [facility] CARE PROVIDER KNOW ABOUT:   Any allergies you have.  All medicines you are taking, including vitamins, herbs, eye drops, creams, and over-the-counter medicines.  Previous problems you or members of your family have had with the use of anesthetics.  Any blood disorders you have.  Previous surgeries you have had.  Medical conditions you have. RISKS AND COMPLICATIONS  Generally, this is a safe procedure. However, as with any procedure, complications can occur. Possible complications include:  Bleeding.  Tearing or rupture of the colon wall.  Reaction to medicines given during the exam.  Infection (rare). BEFORE THE PROCEDURE   Ask your health care provider about changing or stopping your regular medicines.  You may be prescribed an oral bowel prep. This involves drinking a large amount of medicated liquid,  starting the day before your procedure. The liquid will cause you to have multiple loose stools until your stool is almost clear or light green. This cleans out your colon in preparation for the procedure.  Do not eat or drink anything else once you have started the bowel prep, unless your health care provider tells you it is safe to do so.  Arrange for someone to drive you home after the procedure. PROCEDURE   You will be given medicine to help you relax (sedative).  You will lie on your side with your knees bent.  A long, flexible tube with a light and camera on the end (colonoscope) will be inserted through the  rectum and into the colon. The camera sends video back to a computer screen as it moves through the colon. The colonoscope also releases carbon dioxide gas to inflate the colon. This helps your health care provider see the area better.  During the exam, your health care provider may take a small tissue sample (biopsy) to be examined under a microscope if any abnormalities are found.  The exam is finished when the entire colon has been viewed. AFTER THE PROCEDURE   Do not drive for 24 hours after the exam.  You may have a small amount of blood in your stool.  You may pass moderate amounts of gas and have mild abdominal cramping or bloating. This is caused by the gas used to inflate your colon during the exam.  Ask when your test results will be ready and how you will get your results. Make sure you get your test results. Document Released: 02/06/2000 Document Revised: 11/29/2012 Document Reviewed: 10/16/2012 [facility] Patient Information 2015 [place], [place]. This information is not intended to replace advice given to you by your health care provider. Make sure you discuss any questions you have with your health care provider. PATIENT INSTRUCTIONS POST-ANESTHESIA  IMMEDIATELY FOLLOWING SURGERY:  Do not drive or operate machinery for the first twenty four hours after surgery.  Do  not make any important decisions for twenty four hours after surgery or while taking narcotic pain medications or sedatives.  If you develop intractable nausea and vomiting or a severe headache please notify your doctor immediately.  FOLLOW-UP:  Please make an appointment with your surgeon as instructed. You do not need to follow up with anesthesia unless specifically instructed to do so.  WOUND CARE INSTRUCTIONS (if applicable):  Keep a dry clean dressing on the anesthesia/puncture wound site if there is drainage.  Once the wound has quit draining you may leave it open to air.  Generally you should leave the bandage intact for twenty four hours unless there is drainage.  If the epidural site drains for more than 36-48 hours please call the anesthesia department.  QUESTIONS?:  Please feel free to call your physician or the hospital operator if you have any questions, and they will be happy to assist you.

## 2014-07-25 ENCOUNTER — Ambulatory Visit (HOSPITAL_COMMUNITY)
Admission: RE | Admit: 2014-07-25 | Discharge: 2014-07-25 | Disposition: A | Discharge status: Home / Self Care | Payer: BLUE CROSS/BLUE SHIELD | Source: Ambulatory Visit | Attending: Internal Medicine | Admitting: Internal Medicine

## 2014-07-25 ENCOUNTER — Encounter (HOSPITAL_COMMUNITY): Payer: Self-pay | Admitting: *Deleted

## 2014-07-25 ENCOUNTER — Ambulatory Visit (HOSPITAL_COMMUNITY): Payer: BLUE CROSS/BLUE SHIELD | Admitting: Anesthesiology

## 2014-07-25 ENCOUNTER — Other Ambulatory Visit: Payer: Self-pay

## 2014-07-25 ENCOUNTER — Telehealth: Payer: Self-pay | Admitting: Internal Medicine

## 2014-07-25 ENCOUNTER — Encounter (HOSPITAL_COMMUNITY)
Admission: RE | Disposition: A | Discharge status: Home / Self Care | Payer: Self-pay | Source: Ambulatory Visit | Attending: Internal Medicine

## 2014-07-25 DIAGNOSIS — F319 Bipolar disorder, unspecified: Secondary | ICD-10-CM | POA: Insufficient documentation

## 2014-07-25 DIAGNOSIS — K449 Diaphragmatic hernia without obstruction or gangrene: Secondary | ICD-10-CM | POA: Diagnosis not present

## 2014-07-25 DIAGNOSIS — K620 Anal polyp: Secondary | ICD-10-CM | POA: Diagnosis not present

## 2014-07-25 DIAGNOSIS — K2100 Gastro-esophageal reflux disease with esophagitis, without bleeding: Secondary | ICD-10-CM | POA: Insufficient documentation

## 2014-07-25 DIAGNOSIS — K3189 Other diseases of stomach and duodenum: Secondary | ICD-10-CM | POA: Diagnosis not present

## 2014-07-25 DIAGNOSIS — Z1211 Encounter for screening for malignant neoplasm of colon: Secondary | ICD-10-CM | POA: Insufficient documentation

## 2014-07-25 DIAGNOSIS — R634 Abnormal weight loss: Secondary | ICD-10-CM | POA: Diagnosis present

## 2014-07-25 DIAGNOSIS — K2950 Unspecified chronic gastritis without bleeding: Secondary | ICD-10-CM | POA: Diagnosis not present

## 2014-07-25 DIAGNOSIS — Z86010 Personal history of colonic polyps: Secondary | ICD-10-CM | POA: Insufficient documentation

## 2014-07-25 DIAGNOSIS — K621 Rectal polyp: Secondary | ICD-10-CM | POA: Insufficient documentation

## 2014-07-25 DIAGNOSIS — F1490 Cocaine use, unspecified, uncomplicated: Secondary | ICD-10-CM | POA: Diagnosis not present

## 2014-07-25 DIAGNOSIS — F17210 Nicotine dependence, cigarettes, uncomplicated: Secondary | ICD-10-CM | POA: Insufficient documentation

## 2014-07-25 DIAGNOSIS — R1314 Dysphagia, pharyngoesophageal phase: Secondary | ICD-10-CM

## 2014-07-25 DIAGNOSIS — K7460 Unspecified cirrhosis of liver: Secondary | ICD-10-CM

## 2014-07-25 DIAGNOSIS — R1310 Dysphagia, unspecified: Secondary | ICD-10-CM | POA: Diagnosis present

## 2014-07-25 HISTORY — PX: COLONOSCOPY WITH PROPOFOL: SHX5780

## 2014-07-25 HISTORY — PX: ESOPHAGOGASTRODUODENOSCOPY (EGD) WITH PROPOFOL: SHX5813

## 2014-07-25 HISTORY — PX: ESOPHAGEAL DILATION: SHX303

## 2014-07-25 LAB — RAPID URINE DRUG SCREEN, HOSP PERFORMED
Amphetamines: NOT DETECTED
BARBITURATES: NOT DETECTED
Benzodiazepines: POSITIVE — AB
Cocaine: NOT DETECTED
Opiates: NOT DETECTED
Tetrahydrocannabinol: POSITIVE — AB

## 2014-07-25 SURGERY — COLONOSCOPY WITH PROPOFOL
Anesthesia: Monitor Anesthesia Care | Site: Esophagus

## 2014-07-25 MED ORDER — MIDAZOLAM HCL 2 MG/2ML IJ SOLN
INTRAMUSCULAR | Status: AC
  Filled 2014-07-25: qty 2

## 2014-07-25 MED ORDER — PROPOFOL 10 MG/ML IV BOLUS
INTRAVENOUS | Status: DC | PRN
  Administered 2014-07-25: 20 mg via INTRAVENOUS

## 2014-07-25 MED ORDER — LIDOCAINE VISCOUS 2 % MT SOLN
3.0000 mL | Freq: Once | OROMUCOSAL | Status: AC
  Administered 2014-07-25: 3 mL via OROMUCOSAL

## 2014-07-25 MED ORDER — LACTATED RINGERS IV SOLN
INTRAVENOUS | Status: DC
  Administered 2014-07-25: 12:00:00 via INTRAVENOUS

## 2014-07-25 MED ORDER — LIDOCAINE VISCOUS 2 % MT SOLN
OROMUCOSAL | Status: AC
  Filled 2014-07-25: qty 15

## 2014-07-25 MED ORDER — FENTANYL CITRATE (PF) 100 MCG/2ML IJ SOLN
25.0000 ug | INTRAMUSCULAR | Status: AC
  Administered 2014-07-25 (×2): 25 ug via INTRAVENOUS

## 2014-07-25 MED ORDER — ONDANSETRON HCL 4 MG/2ML IJ SOLN
4.0000 mg | Freq: Once | INTRAMUSCULAR | Status: AC
  Administered 2014-07-25: 4 mg via INTRAVENOUS

## 2014-07-25 MED ORDER — FENTANYL CITRATE (PF) 100 MCG/2ML IJ SOLN: 25.0000 ug | INTRAMUSCULAR | Status: DC | PRN

## 2014-07-25 MED ORDER — MIDAZOLAM HCL 5 MG/5ML IJ SOLN
INTRAMUSCULAR | Status: DC | PRN
  Administered 2014-07-25 (×2): 1 mg via INTRAVENOUS

## 2014-07-25 MED ORDER — PROPOFOL INFUSION 10 MG/ML OPTIME
INTRAVENOUS | Status: DC | PRN
  Administered 2014-07-25: 125 ug/kg/min via INTRAVENOUS
  Administered 2014-07-25: 75 ug/kg/min via INTRAVENOUS

## 2014-07-25 MED ORDER — ONDANSETRON HCL 4 MG/2ML IJ SOLN: 4.0000 mg | Freq: Once | INTRAMUSCULAR | Status: DC | PRN

## 2014-07-25 MED ORDER — FENTANYL CITRATE (PF) 100 MCG/2ML IJ SOLN
INTRAMUSCULAR | Status: AC
  Filled 2014-07-25: qty 2

## 2014-07-25 MED ORDER — ONDANSETRON HCL 4 MG/2ML IJ SOLN
INTRAMUSCULAR | Status: AC
  Filled 2014-07-25: qty 2

## 2014-07-25 MED ORDER — STERILE WATER FOR IRRIGATION IR SOLN
Status: DC | PRN
  Administered 2014-07-25: 1000 mL

## 2014-07-25 MED ORDER — WATER FOR IRRIGATION, STERILE IR SOLN
Status: DC | PRN
  Administered 2014-07-25: 1000 mL via SURGICAL_CAVITY

## 2014-07-25 MED ORDER — MIDAZOLAM HCL 2 MG/2ML IJ SOLN
1.0000 mg | INTRAMUSCULAR | Status: DC | PRN
Start: 2014-07-25 — End: 2014-07-25
  Administered 2014-07-25 (×2): 2 mg via INTRAVENOUS

## 2014-07-25 SURGICAL SUPPLY — 34 items
BALLN CRE LF 10-12 240X5.5 (BALLOONS)
BALLN DILATOR CRE 12-15 240 (BALLOONS)
BALLN DILATOR CRE 15-18 240 (BALLOONS) IMPLANT
BALLN DILATOR CRE 18-20 240 (BALLOONS) IMPLANT
BALLN DILATOR CRE WIREGUIDE (BALLOONS)
BALLOON CRE LF 10-12 240X5.5 (BALLOONS) IMPLANT
BALLOON DILATOR CRE 12-15 240 (BALLOONS) IMPLANT
BALLOON DILATOR CRE WIREGUIDE (BALLOONS) IMPLANT
BLOCK BITE 60FR ADLT L/F BLUE (MISCELLANEOUS) ×1 IMPLANT
DEVICE CLIP HEMOSTAT 235CM (CLIP) IMPLANT
ELECT REM PT RETURN 9FT ADLT (ELECTROSURGICAL) ×3
ELECTRODE REM PT RTRN 9FT ADLT (ELECTROSURGICAL) IMPLANT
FCP BXJMBJMB 240X2.8X (CUTTING FORCEPS)
FLOOR PAD 36X40 (MISCELLANEOUS) ×3
FORCEPS BIOP RAD 4 LRG CAP 4 (CUTTING FORCEPS) ×1 IMPLANT
FORCEPS BIOP RJ4 240 W/NDL (CUTTING FORCEPS)
FORCEPS BXJMBJMB 240X2.8X (CUTTING FORCEPS) IMPLANT
FORMALIN 10 PREFIL 20ML (MISCELLANEOUS) ×2 IMPLANT
INJECTOR/SNARE I SNARE (MISCELLANEOUS) IMPLANT
KIT ENDO PROCEDURE PEN (KITS) ×3 IMPLANT
MANIFOLD NEPTUNE II (INSTRUMENTS) ×1 IMPLANT
NDL SCLEROTHERAPY 25GX240 (NEEDLE) IMPLANT
NEEDLE SCLEROTHERAPY 25GX240 (NEEDLE) IMPLANT
PAD FLOOR 36X40 (MISCELLANEOUS) IMPLANT
PROBE APC STR FIRE (PROBE) IMPLANT
PROBE INJECTION GOLD (MISCELLANEOUS)
PROBE INJECTION GOLD 7FR (MISCELLANEOUS) IMPLANT
SNARE ROTATE MED OVAL 20MM (MISCELLANEOUS) IMPLANT
SNARE SHORT THROW 13M SML OVAL (MISCELLANEOUS) ×3 IMPLANT
SYR INFLATE BILIARY GAUGE (MISCELLANEOUS) IMPLANT
SYR INFLATION 60ML (SYRINGE) ×2 IMPLANT
TRAP SPECIMEN MUCOUS 40CC (MISCELLANEOUS) IMPLANT
TUBING IRRIGATION ENDOGATOR (MISCELLANEOUS) ×1 IMPLANT
WATER STERILE IRR 1000ML POUR (IV SOLUTION) ×1 IMPLANT

## 2014-07-25 NOTE — OR Nursing (Signed)
Dr. Patsey Berthold notified of DBP being 104-108 consistently after sedation. No orders received and said to proceed with procedure.

## 2014-07-25 NOTE — Anesthesia Postprocedure Evaluation (Signed)
  Anesthesia Post-op Note  Patient: Bobby York  Procedure(s) Performed: Procedure(s) with comments: COLONOSCOPY WITH PROPOFOL (N/A) - In cecum @ 1300, out @ 1309 ESOPHAGOGASTRODUODENOSCOPY (EGD) WITH PROPOFOL (N/A) ESOPHAGEAL DILATION (N/A) - Malony dilators # 56,  Patient Location: PACU  Anesthesia Type:MAC  Level of Consciousness: awake, alert  and oriented  Airway and Oxygen Therapy: Patient Spontanous Breathing  Post-op Pain: none  Post-op Assessment: Post-op Vital signs reviewed, Patient's Cardiovascular Status Stable, Respiratory Function Stable, Patent Airway and No signs of Nausea or vomiting  Post-op Vital Signs: Reviewed and stable  Last Vitals:  Filed Vitals:   07/25/14 1342  BP: 138/85  Pulse: 73  Temp:   Resp: 22    Complications: No apparent anesthesia complications
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
## 2014-07-25 NOTE — Telephone Encounter (Signed)
Called pt insurance for approval for CT abd/pelvis per RMR recommendation.   Case was sent to review and they are requesting a peer to peer.   The number to call is [phone]  Case # is [id number]  Routing to Neil Crouch PA since she seen patient at last office visit.

## 2014-07-25 NOTE — Transfer of Care (Signed)
Immediate Anesthesia Transfer of Care Note  Patient: Bobby York  Procedure(s) Performed: Procedure(s) with comments: COLONOSCOPY WITH PROPOFOL (N/A) - In cecum @ 1300, out @ 1309 ESOPHAGOGASTRODUODENOSCOPY (EGD) WITH PROPOFOL (N/A) ESOPHAGEAL DILATION (N/A) - Malony dilators # 56,  Patient Location: PACU  Anesthesia Type:MAC  Level of Consciousness: awake, alert  and oriented  Airway & Oxygen Therapy: Patient Spontanous Breathing  Post-op Assessment: Report given to RN  Post vital signs: Reviewed and stable  Last Vitals:  Filed Vitals:   07/25/14 1215  BP: 149/107  Pulse:   Resp: 26    Complications: No apparent anesthesia complications

## 2014-07-25 NOTE — Anesthesia Preprocedure Evaluation (Signed)
Anesthesia Evaluation  Patient identified by MRN, date of birth, ID band Patient awake    Reviewed: Allergy & Precautions, NPO status , Patient's Chart, lab work & pertinent test results  Airway Mallampati: I  TM Distance: >3 FB     Dental  (+) Edentulous Lower   Pulmonary Current Smoker,  breath sounds clear to auscultation        Cardiovascular negative cardio ROS  Rhythm:Regular Rate:Normal     Neuro/Psych PSYCHIATRIC DISORDERS Bipolar Disorder    GI/Hepatic (+)     substance abuse  alcohol use and cocaine use, Hepatitis -, C  Endo/Other    Renal/GU      Musculoskeletal   Abdominal   Peds  Hematology   Anesthesia Other Findings   Reproductive/Obstetrics                             Anesthesia Physical Anesthesia Plan  ASA: III  Anesthesia Plan: MAC   Post-op Pain Management:    Induction: Intravenous  Airway Management Planned: Simple Face Mask  Additional Equipment:   Intra-op Plan:   Post-operative Plan:   Informed Consent: I have reviewed the patients History and Physical, chart, labs and discussed the procedure including the risks, benefits and alternatives for the proposed anesthesia with the patient or authorized representative who has indicated his/her understanding and acceptance.     Plan Discussed with:   Anesthesia Plan Comments:         Anesthesia Quick Evaluation

## 2014-07-25 NOTE — Telephone Encounter (Signed)
Hospital called to schedule FU OV in 4-6 weeks and is aware of OV on 6/30 at 930 with EG and also said patient needed to be scheduled for a CT abd/pelvis to further evaluate wt loss with chronic Hep C

## 2014-07-25 NOTE — Op Note (Signed)
[facility] [address] [place], [address]   COLONOSCOPY PROCEDURE REPORT  PATIENT: Bobby York, Bobby York  MR#: [id number] BIRTHDATE: 11-16-1955 , 35  yrs. old GENDER: male ENDOSCOPIST: R.  Garfield Cornea, MD FACP [facility] REFERRED YB:[facility] Gerarda Fraction, M.D. PROCEDURE DATE:  2014/08/13 PROCEDURE:   Colonoscopy with biopsy INDICATIONS:First ever average risk colorectal cancer screening examination. MEDICATIONS: Deep sedation per Dr.  Patsey Berthold and associates. ASA CLASS:       Class III  CONSENT: The risks, benefits, alternatives and imponderables including but not limited to bleeding, perforation as well as the possibility of a missed lesion have been reviewed.  The potential for biopsy, lesion removal, etc. have also been discussed. Questions have been answered.  All parties agreeable.  Please see the history and physical in the medical record for more information.  DESCRIPTION OF PROCEDURE:   After the risks benefits and alternatives of the procedure were thoroughly explained, informed consent was obtained.  The digital rectal exam revealed no abnormalities of the rectum.   The     endoscope was introduced through the anus and advanced to the cecum, which was identified by both the appendix and ileocecal valve. No adverse events experienced.   The quality of the prep was adequate  The instrument was then slowly withdrawn as the colon was fully examined.      COLON FINDINGS: (1) diminutive polyp at 10 cm from the anal verge; otherwise, the remainder of the colonic mucosa appeared normal. The colonic mucosa appeared normal.  The above polyp was cold biopsy/removed.  Retroflexion was performed. .  Withdrawal time=10 minutes 0 seconds.  The scope was withdrawn and the procedure completed. COMPLICATIONS: There were no immediate complications.  ENDOSCOPIC IMPRESSION: Single rectal polyp?"removed as described above. Otherwise  negative colonoscopy  RECOMMENDATIONS: Follow-up on pathology. See EGD report. Proceed with abdominal pelvic CT with contrast to further evaluate weight loss in a patient with chronic hepatitis C infection.  eSigned:  R. Garfield Cornea, MD Rosalita Chessman [facility] August 13, 2014 1:25 PM   cc:  CPT CODES: ICD CODES:  The ICD and CPT codes recommended by this software are interpretations from the data that the clinical staff has captured with the software.  The verification of the translation of this report to the ICD and CPT codes and modifiers is the sole responsibility of the health care institution and practicing physician where this report was generated.  [employer] will not be held responsible for the validity of the ICD and CPT codes included on this report.  AMA assumes no liability for data contained or not contained herein. CPT is a Designer, television/film set of the [employer].

## 2014-07-25 NOTE — Telephone Encounter (Signed)
What diagnosis did you use for reason for CT?

## 2014-07-25 NOTE — Discharge Instructions (Signed)
Gastroesophageal Reflux Disease, Adult Gastroesophageal reflux disease (GERD) happens when acid from your stomach goes into your food pipe (esophagus). The acid can cause a burning feeling in your chest. Over time, the acid can make small holes (ulcers) in your food pipe.  HOME CARE  Ask your doctor for advice about:  Losing weight.  Quitting smoking.  Alcohol use.  Avoid foods and drinks that make your problems worse. You may want to avoid:  Caffeine and alcohol.  Chocolate.  Mints.  Garlic and onions.  Spicy foods.  Citrus fruits, such as oranges, lemons, or limes.  Foods that contain tomato, such as sauce, chili, salsa, and pizza.  Fried and fatty foods.  Avoid lying down for 3 hours before you go to bed or before you take a nap.  Eat small meals often, instead of large meals.  Wear loose-fitting clothing. Do not wear anything tight around your waist.  Raise (elevate) the head of your bed 6 to 8 inches with wood blocks. Using extra pillows does not help.  Only take medicines as told by your doctor.  Do not take aspirin or ibuprofen. GET HELP RIGHT AWAY IF:   You have pain in your arms, neck, jaw, teeth, or back.  Your pain gets worse or changes.  You feel sick to your stomach (nauseous), throw up (vomit), or sweat (diaphoresis).  You feel short of breath, or you pass out (faint).  Your throw up is green, yellow, black, or looks like coffee grounds or blood.  Your poop (stool) is red, bloody, or black. MAKE SURE YOU:   Understand these instructions.  Will watch your condition.  Will get help right away if you are not doing well or get worse. Document Released: 07/28/2007 Document Revised: 05/03/2011 Document Reviewed: 08/28/2010 [facility] Patient Information 2015 [place], [place]. This information is not intended to replace advice given to you by your health care provider. Make sure you discuss any questions you have with your health care provider. Food  Choices for Gastroesophageal Reflux Disease When you have gastroesophageal reflux disease (GERD), the foods you eat and your eating habits are very important. Choosing the right foods can help ease the discomfort of GERD. WHAT GENERAL GUIDELINES DO I NEED TO FOLLOW?  Choose fruits, vegetables, whole grains, low-fat dairy products, and low-fat meat, fish, and poultry.  Limit fats such as oils, salad dressings, butter, nuts, and avocado.  Keep a food diary to identify foods that cause symptoms.  Avoid foods that cause reflux. These may be different for different people.  Eat frequent small meals instead of three large meals each day.  Eat your meals slowly, in a relaxed setting.  Limit fried foods.  Cook foods using methods other than frying.  Avoid drinking alcohol.  Avoid drinking large amounts of liquids with your meals.  Avoid bending over or lying down until 2-3 hours after eating. WHAT FOODS ARE NOT RECOMMENDED? The following are some foods and drinks that may worsen your symptoms: Vegetables Tomatoes. Tomato juice. Tomato and spaghetti sauce. Chili peppers. Onion and garlic. Horseradish. Fruits Oranges, grapefruit, and lemon (fruit and juice). Meats High-fat meats, fish, and poultry. This includes hot dogs, ribs, ham, sausage, salami, and bacon. Dairy Whole milk and chocolate milk. Sour cream. Cream. Butter. Ice cream. Cream cheese.  Beverages Coffee and tea, with or without caffeine. Carbonated beverages or energy drinks. Condiments Hot sauce. Barbecue sauce.  Sweets/Desserts Chocolate and cocoa. Donuts. Peppermint and spearmint. Fats and Oils High-fat foods, including [place] fries and  potato chips. Other Vinegar. Strong spices, such as black pepper, white pepper, red pepper, cayenne, curry powder, cloves, ginger, and chili powder. The items listed above may not be a complete list of foods and beverages to avoid. Contact your dietitian for more  information. Document Released: 02/08/2005 Document Revised: 02/13/2013 Document Reviewed: 12/13/2012 [facility] Patient Information 2015 [place], [place]. This information is not intended to replace advice given to you by your health care provider. Make sure you discuss any questions you have with your health care provider. Colon Polyps Polyps are lumps of extra tissue growing inside the body. Polyps can grow in the large intestine (colon). Most colon polyps are noncancerous (benign). However, some colon polyps can become cancerous over time. Polyps that are larger than a pea may be harmful. To be safe, caregivers remove and test all polyps. CAUSES  Polyps form when mutations in the genes cause your cells to grow and divide even though no more tissue is needed. RISK FACTORS There are a number of risk factors that can increase your chances of getting colon polyps. They include:  Being older than 50 years.  Family history of colon polyps or colon cancer.  Long-term colon diseases, such as colitis or Crohn disease.  Being overweight.  Smoking.  Being inactive.  Drinking too much alcohol. SYMPTOMS  Most small polyps do not cause symptoms. If symptoms are present, they may include:  Blood in the stool. The stool may look dark red or black.  Constipation or diarrhea that lasts longer than 1 week. DIAGNOSIS People often do not know they have polyps until their caregiver finds them during a regular checkup. Your caregiver can use 4 tests to check for polyps:  Digital rectal exam. The caregiver wears gloves and feels inside the rectum. This test would find polyps only in the rectum.  Barium enema. The caregiver puts a liquid called barium into your rectum before taking X-rays of your colon. Barium makes your colon look white. Polyps are dark, so they are easy to see in the X-ray pictures.  Sigmoidoscopy. A thin, flexible tube (sigmoidoscope) is placed into your rectum. The sigmoidoscope has  a light and tiny camera in it. The caregiver uses the sigmoidoscope to look at the last third of your colon.  Colonoscopy. This test is like sigmoidoscopy, but the caregiver looks at the entire colon. This is the most common method for finding and removing polyps. TREATMENT  Any polyps will be removed during a sigmoidoscopy or colonoscopy. The polyps are then tested for cancer. PREVENTION  To help lower your risk of getting more colon polyps:  Eat plenty of fruits and vegetables. Avoid eating fatty foods.  Do not smoke.  Avoid drinking alcohol.  Exercise every day.  Lose weight if recommended by your caregiver.  Eat plenty of calcium and folate. Foods that are rich in calcium include milk, cheese, and broccoli. Foods that are rich in folate include chickpeas, kidney beans, and spinach. HOME CARE INSTRUCTIONS Keep all follow-up appointments as directed by your caregiver. You may need periodic exams to check for polyps. SEEK MEDICAL CARE IF: You notice bleeding during a bowel movement. Document Released: 11/05/2003 Document Revised: 05/03/2011 Document Reviewed: 04/20/2011 [facility] Patient Information 2015 [place], [place]. This information is not intended to replace advice given to you by your health care provider. Make sure you discuss any questions you have with your health care provider.  Colonoscopy Discharge Instructions  Read the instructions outlined below and refer to this sheet in the next  few weeks. These discharge instructions provide you with general information on caring for yourself after you leave the hospital. Your doctor may also give you specific instructions. While your treatment has been planned according to the most current medical practices available, unavoidable complications occasionally occur. If you have any problems or questions after discharge, call Dr. Gala Romney at [phone]. ACTIVITY  You may resume your regular activity, but move at a slower pace for the next  24 hours.   Take frequent rest periods for the next 24 hours.   Walking will help get rid of the air and reduce the bloated feeling in your belly (abdomen).   No driving for 24 hours (because of the medicine (anesthesia) used during the test).    Do not sign any important legal documents or operate any machinery for 24 hours (because of the anesthesia used during the test).  NUTRITION  Drink plenty of fluids.   You may resume your normal diet as instructed by your doctor.   Begin with a light meal and progress to your normal diet. Heavy or fried foods are harder to digest and may make you feel sick to your stomach (nauseated).   Avoid alcoholic beverages for 24 hours or as instructed.  MEDICATIONS  You may resume your normal medications unless your doctor tells you otherwise.  WHAT YOU CAN EXPECT TODAY  Some feelings of bloating in the abdomen.   Passage of more gas than usual.   Spotting of blood in your stool or on the toilet paper.  IF YOU HAD POLYPS REMOVED DURING THE COLONOSCOPY:  No aspirin products for 7 days or as instructed.   No alcohol for 7 days or as instructed.   Eat a soft diet for the next 24 hours.  FINDING OUT THE RESULTS OF YOUR TEST Not all test results are available during your visit. If your test results are not back during the visit, make an appointment with your caregiver to find out the results. Do not assume everything is normal if you have not heard from your caregiver or the medical facility. It is important for you to follow up on all of your test results.  SEEK IMMEDIATE MEDICAL ATTENTION IF:  You have more than a spotting of blood in your stool.   Your belly is swollen (abdominal distention).   You are nauseated or vomiting.   You have a temperature over 101.  You have abdominal pain or discomfort that is severe or gets worse throughout the day. EGD Discharge instructions Please read the instructions outlined below and refer to this sheet  in the next few weeks. These discharge instructions provide you with general information on caring for yourself after you leave the hospital. Your doctor may also give you specific instructions. While your treatment has been planned according to the most current medical practices available, unavoidable complications occasionally occur. If you have any problems or questions after discharge, please call your doctor. ACTIVITY You may resume your regular activity but move at a slower pace for the next 24 hours.  Take frequent rest periods for the next 24 hours.  Walking will help expel (get rid of) the air and reduce the bloated feeling in your abdomen.  No driving for 24 hours (because of the anesthesia (medicine) used during the test).  You may shower.  Do not sign any important legal documents or operate any machinery for 24 hours (because of the anesthesia used during the test).  NUTRITION Drink plenty of fluids.  You may resume your normal diet.  Begin with a light meal and progress to your normal diet.  Avoid alcoholic beverages for 24 hours or as instructed by your caregiver.  MEDICATIONS You may resume your normal medications unless your caregiver tells you otherwise.  WHAT YOU CAN EXPECT TODAY You may experience abdominal discomfort such as a feeling of fullness or gas pains.  FOLLOW-UP Your doctor will discuss the results of your test with you.  SEEK IMMEDIATE MEDICAL ATTENTION IF ANY OF THE FOLLOWING OCCUR: Excessive nausea (feeling sick to your stomach) and/or vomiting.  Severe abdominal pain and distention (swelling).  Trouble swallowing.  Temperature over 101 F (37.8 C).  Rectal bleeding or vomiting of blood.     GERD information provided  Begin Protonix 40 mg daily  Polyp information provided  Contrast CT of the abdomen and pelvis to further evaluate weight loss in a patient with chronic hepatitis C infection  Further recommendations to follow pending review of  pathology report  Office visit with [place] in 4-6 weeks

## 2014-07-25 NOTE — Op Note (Signed)
[facility] [address] [place], [address]   ENDOSCOPY PROCEDURE REPORT  PATIENT: Bobby York, Bobby York  MR#: [id number] BIRTHDATE: [birth year] , 48  yrs. old GENDER: male ENDOSCOPIST: R.  Garfield Cornea, MD FACP [facility] REFERRED BY:  Kerin Perna, M.D. PROCEDURE DATE:  August 07, 2014 PROCEDURE:  EGD w/ biopsy and Maloney dilation INDICATIONS:  Esophageal dysphagia.; Weight loss MEDICATIONS: Deep sedation per Dr.  [facility]sey [facility] ASA CLASS:      Class III  CONSENT: The risks, benefits, limitations, alternatives and imponderables have been discussed.  The potential for biopsy, esophogeal dilation, etc. have also been reviewed.  Questions have been answered.  All parties agreeable.  Please see the history and physical in the medical record for more information.  DESCRIPTION OF PROCEDURE: After the risks benefits and alternatives of the procedure were thoroughly explained, informed consent was obtained.  The    endoscope was introduced through the mouth and advanced to the second portion of the duodenum , limited by Without limitations. The instrument was slowly withdrawn as the mucosa was fully examined.    Circumferential distal esophageal erosions within 3 mm of the GE junction.  The tubular esophagus appeared patent throughout its course.  No tumor.  No Barrett's esophagus seen.  Stomach empty. Small hiatal hernia.  Multiple areas of erosion on the greater curvature and antral erosions.  No ulcer or infiltrating process. Pylorus patent.  Examination the first and second portion of the duodenum revealed no abnormalities.  A 56 [place] Venia Minks was passed to full insertion easily.  A look back revealed no apparent complication related to this maneuver. Subsequently, biopsies of the abnormal gastric mucosa taken for histologic study.  There was no evidence of esophageal varices or portal gastropathy. Retroflexed views revealed a hiatal hernia.     The scope was  then withdrawn from the patient and the procedure completed.  COMPLICATIONS: There were no immediate complications.  ENDOSCOPIC IMPRESSION: Mild erosive reflux esophagitis. Status post passage of a Maloney dilator. Hiatal hernia. Gastric erosions?"status post biopsy  RECOMMENDATIONS: Begin Protonix 40 mg daily. Follow-up pathology. See colonoscopy report.  REPEAT EXAM:  eSigned:  R. Garfield Cornea, MD Rosalita Chessman [facility] Aug 07, 2014 1:21 PM    CC:  CPT CODES: ICD CODES:  The ICD and CPT codes recommended by this software are interpretations from the data that the clinical staff has captured with the software.  The verification of the translation of this report to the ICD and CPT codes and modifiers is the sole responsibility of the health care institution and practicing physician where this report was generated.  [employer] will not be held responsible for the validity of the ICD and CPT codes included on this report.  AMA assumes no liability for data contained or not contained herein. CPT is a Designer, television/film set of the [employer].  PATIENT NAME:  Bobby York, Bobby York MR#: [id number]

## 2014-07-25 NOTE — Interval H&P Note (Signed)
History and Physical Interval Note:  07/25/2014 12:15 PM  Bobby York  has presented today for surgery, with the diagnosis of weight loss, dysphagia, HCV  The various methods of treatment have been discussed with the patient and family. After consideration of risks, benefits and other options for treatment, the patient has consented to  Procedure(s) with comments: COLONOSCOPY WITH PROPOFOL (N/A) - 1345 ESOPHAGOGASTRODUODENOSCOPY (EGD) WITH PROPOFOL (N/A) ESOPHAGEAL DILATION (N/A) as a surgical intervention .  The patient's history has been reviewed, patient examined, no change in status, stable for surgery.  I have reviewed the patient's chart and labs.  Questions were answered to the patient's satisfaction.     Karim Aiello  No change. EGD with esophageal dilation as feasible/appropriate and screening colonoscopy today per plan.  The risks, benefits, limitations, imponderables and alternatives regarding both EGD and colonoscopy have been reviewed with the patient. Questions have been answered. All parties agreeable.

## 2014-07-25 NOTE — Progress Notes (Signed)
Bobby York unable to void for urine drug screen.  Dr Patsey Berthold notified.  Ordered in and cath for urine collection. In and out cath at 11:30 am.  7ml dark colored urine.

## 2014-07-26 ENCOUNTER — Encounter (HOSPITAL_COMMUNITY): Payer: Self-pay | Admitting: Internal Medicine

## 2014-07-26 NOTE — Telephone Encounter (Signed)
Pt is aware of appointment 

## 2014-07-26 NOTE — Telephone Encounter (Signed)
Called Pt and LMOM regarding CT appt. CT is set for 07/31/2014.  Pt needs to be there at 0745am.   LMOM to pick up contrast

## 2014-07-26 NOTE — Telephone Encounter (Signed)
Weight loss and hep C

## 2014-07-26 NOTE — Telephone Encounter (Signed)
noted 

## 2014-07-26 NOTE — Telephone Encounter (Signed)
CT A/P with contrast Authorization code [id number]. Good for 30 days from 07/25/14.

## 2014-07-29 ENCOUNTER — Encounter: Payer: Self-pay | Admitting: Internal Medicine

## 2014-07-30 ENCOUNTER — Telehealth: Payer: Self-pay

## 2014-07-30 NOTE — Telephone Encounter (Signed)
Per RMR- Needs Prevpac 14 days; have patient decrease dose of Xanax to 1/2 mg at bedtime during therapy because Prevpac can increase the effect of Xanax. Likewise, decrease dose of Wellbutrin to 150 mg XR daily times the 2 weeks on Prevpac therapy. Okay to call in a prescription for Wellbutrin 150 mg daily for 2 weeks.of therapy. No refills. Resume usual medication dosings after Prevpac completed.

## 2014-07-31 ENCOUNTER — Ambulatory Visit (HOSPITAL_COMMUNITY)
Admission: RE | Admit: 2014-07-31 | Discharge: 2014-07-31 | Disposition: A | Discharge status: Home / Self Care | Payer: BLUE CROSS/BLUE SHIELD | Source: Ambulatory Visit | Attending: Internal Medicine | Admitting: Internal Medicine

## 2014-07-31 DIAGNOSIS — K5900 Constipation, unspecified: Secondary | ICD-10-CM | POA: Diagnosis present

## 2014-07-31 DIAGNOSIS — K7460 Unspecified cirrhosis of liver: Secondary | ICD-10-CM

## 2014-07-31 DIAGNOSIS — R634 Abnormal weight loss: Secondary | ICD-10-CM | POA: Diagnosis not present

## 2014-07-31 MED ORDER — IOHEXOL 300 MG/ML  SOLN
100.0000 mL | Freq: Once | INTRAMUSCULAR | Status: AC | PRN
  Administered 2014-07-31: 100 mL via INTRAVENOUS

## 2014-08-01 MED ORDER — AMOXICILL-CLARITHRO-LANSOPRAZ PO MISC: Freq: Two times a day (BID) | ORAL | Status: DC

## 2014-08-01 NOTE — Progress Notes (Signed)
Quick Note:  Save for further RMR review and recommendations.  Small right benign adrenal adenoma. Nothing to explain weight loss and constipation. ______

## 2014-08-01 NOTE — Telephone Encounter (Signed)
Pt is aware. We went over all his medications. He knows how to adjust his xanax and wellbutrin, he said he is able to cut his Wellbutrin 300mg  in half and will not need a new rx sent in. He will also stop his protonix while on the prevpac. I answered all his questions. prevpac rx has been sent to the pharmacy.

## 2014-08-08 ENCOUNTER — Telehealth: Payer: Self-pay | Admitting: Gastroenterology

## 2014-08-08 DIAGNOSIS — R7989 Other specified abnormal findings of blood chemistry: Secondary | ICD-10-CM

## 2014-08-08 DIAGNOSIS — R945 Abnormal results of liver function studies: Secondary | ICD-10-CM

## 2014-08-08 NOTE — Telephone Encounter (Signed)
Bobby York, I'm still waiting for the HCV RNA lab result from PCP. Can you request again? Thanks!

## 2014-08-09 NOTE — Telephone Encounter (Signed)
Labs received from PCP again say that the HCV was not done due to insufficient specimen. We have requested couple of times.  At this point will request patient to go for updated labs, orders placed.  HCV RNA with genotype CMET, PT/INR

## 2014-08-09 NOTE — Telephone Encounter (Signed)
I requested records again this morning.

## 2014-08-12 NOTE — Telephone Encounter (Signed)
Lab orders and letter mailed to the pt.

## 2014-08-15 LAB — PROTIME-INR
INR: 0.99 (ref <1.50)
Prothrombin Time: 13.1 seconds (ref 11.6–15.2)

## 2014-08-15 LAB — COMPREHENSIVE METABOLIC PANEL
ALT: 25 U/L (ref 0–53)
AST: 28 U/L (ref 0–37)
Albumin: 4.3 g/dL (ref 3.5–5.2)
Alkaline Phosphatase: 59 U/L (ref 39–117)
BILIRUBIN TOTAL: 0.7 mg/dL (ref 0.2–1.2)
BUN: 15 mg/dL (ref 6–23)
CO2: 26 mEq/L (ref 19–32)
CREATININE: 1.13 mg/dL (ref 0.50–1.35)
Calcium: 9.6 mg/dL (ref 8.4–10.5)
Chloride: 105 mEq/L (ref 96–112)
GLUCOSE: 93 mg/dL (ref 70–99)
Potassium: 4.2 mEq/L (ref 3.5–5.3)
Sodium: 141 mEq/L (ref 135–145)
Total Protein: 6.9 g/dL (ref 6.0–8.3)

## 2014-08-15 LAB — HCV RNA QUANT RFLX ULTRA OR GENOTYP
HCV QUANT: 626871 [IU]/mL — AB (ref <15)
HCV Quantitative Log: 5.8 {Log} — ABNORMAL HIGH (ref <1.18)

## 2014-08-21 LAB — HEPATITIS C GENOTYPE

## 2014-08-21 NOTE — Telephone Encounter (Signed)
Quick Note:  Please let patient know.  Patient's HCV RNA is positive and he is genotype 1a. Previously failed treatment in remote past. No evidence of cirrhosis on recent CT.  Keep appointment with Randall Hiss tomorrow. I am somewhat apprehensive in starting HCV treatment on him until he is able to demonstrate abstinence from illicit drugs and continue to decrease etoh use. He was having issues with swallowing pills and weight loss and this needs to be stable prior to initiating treatment. Consider possibility of referral to [facility] clinic for HCV treatment??? ______

## 2014-08-22 ENCOUNTER — Encounter: Payer: Self-pay | Admitting: Nurse Practitioner

## 2014-08-22 ENCOUNTER — Ambulatory Visit (INDEPENDENT_AMBULATORY_CARE_PROVIDER_SITE_OTHER): Payer: BLUE CROSS/BLUE SHIELD | Admitting: Nurse Practitioner

## 2014-08-22 VITALS — BP 125/88 | HR 87 | Temp 97.0°F | Ht 71.0 in | Wt 165.2 lb

## 2014-08-22 DIAGNOSIS — K2100 Gastro-esophageal reflux disease with esophagitis, without bleeding: Secondary | ICD-10-CM

## 2014-08-22 DIAGNOSIS — B9681 Helicobacter pylori [H. pylori] as the cause of diseases classified elsewhere: Secondary | ICD-10-CM | POA: Diagnosis not present

## 2014-08-22 DIAGNOSIS — R1319 Other dysphagia: Secondary | ICD-10-CM

## 2014-08-22 DIAGNOSIS — R1314 Dysphagia, pharyngoesophageal phase: Secondary | ICD-10-CM

## 2014-08-22 DIAGNOSIS — B182 Chronic viral hepatitis C: Secondary | ICD-10-CM

## 2014-08-22 DIAGNOSIS — A048 Other specified bacterial intestinal infections: Secondary | ICD-10-CM | POA: Insufficient documentation

## 2014-08-22 DIAGNOSIS — R1310 Dysphagia, unspecified: Secondary | ICD-10-CM

## 2014-08-22 NOTE — Patient Instructions (Addendum)
1. Start taking align probiotic. We are giving you samples to last for 1 week. You can obtain additional doses over-the-counter at the pharmacy. 2. Restart your normal doses of your medications now that you've completed your Prevpac. 3. Start taking Protonix as previously recommended now that you've completed Prevpac. 4. We will contact you regarding recommendations for hep C treatment once we've been able to review all of your follow-up labs.

## 2014-08-22 NOTE — Progress Notes (Signed)
Referring Provider: Redmond School, MD Primary Care Physician:  Glo Herring., MD Primary GI:  Dr. Gala Romney  Chief Complaint  Patient presents with  . Follow-up    HPI:   59 year old male presents for post procedure follow-up after colonoscopy and EGD which is completed for esophageal dysphagia and weight loss. Endoscopy found mild erosive reflux esophagitis, status post passage of a 56 French Maloney dilator, small hiatal hernia, gastric erosions status post biopsy. Biopsy showed active H. pylori. Recommendations continue Protonix 40 mg daily. He was treated with Prevpac 14 days. Colonoscopy found one diminutive polyp 10 cm in the anal verge which was removed and shown to be benign on pathology. CT was completed to further evaluate the weight loss in a patient with chronic Hep C infection. CT impression included small right benign adrenal adenoma, otherwise unremarkable study specifically no evidence to explain patient's weight loss and constipation.  Today he states he's doing well. His dysphagia symptoms have resolved. No GERD symptoms. States he still doesn't feel good. Is having some increased nausea, denies vomiting. Denies abdominal pain, hematochezia, and melena. Denies fever, chills, chest pain, dyspnea, syncope, near syncope. He states he's still interested in Hep C treatment. He completed requested additional labs for Hep C evaluation. Denies any other upper or lower GI symptoms.   Past Medical History  Diagnosis Date  . Bipolar 1 disorder     diagnosed 2004  . Hepatitis     Hep C    Past Surgical History  Procedure Laterality Date  . Back surgery      two-lumbar disc x2  . Colonoscopy with propofol N/A 07/25/2014    MOQ:HUTMLY rectal polyp otherwise normal   . Esophagogastroduodenoscopy (egd) with propofol N/A 07/25/2014    YTK:PTWS erosive reflux/s/p dilator/HH  . Esophageal dilation N/A 07/25/2014    Procedure: ESOPHAGEAL DILATION;  Surgeon: Daneil Dolin, MD;   Location: [facility];  Service: Endoscopy;  Laterality: N/A;  Malony dilators # 56,    Current Outpatient Prescriptions  Medication Sig Dispense Refill  . ALPRAZolam (XANAX) 1 MG tablet Take 1 mg by mouth at bedtime. 1/2 tablet    . buPROPion (WELLBUTRIN XL) 300 MG 24 hr tablet Take 300 mg by mouth daily. 1/2 tablet    . amoxicillin-clarithromycin-lansoprazole (PREVPAC) combo pack Take by mouth 2 (two) times daily. Follow package directions. (Patient not taking: Reported on 08/22/2014) 1 kit 0  . hydrOXYzine (VISTARIL) 50 MG capsule Take 50 mg by mouth 3 (three) times daily as needed for itching.    . naltrexone (DEPADE) 50 MG tablet Take 50 mg by mouth daily.     . ondansetron (ZOFRAN) 4 MG tablet Take 4 mg by mouth 3 (three) times daily as needed for nausea or vomiting.    . polyethylene glycol-electrolytes (NULYTELY/GOLYTELY) 420 G solution Take 4,000 mLs by mouth once. (Patient not taking: Reported on 08/22/2014) 4000 mL 0   No current facility-administered medications for this visit.    Allergies as of 08/22/2014  . (No Known Allergies)    Family History  Problem Relation Age of Onset  . Liver disease Neg Hx   . Colon cancer Neg Hx   . Heart disease Mother   . Prostate cancer Father   . Diabetes Mother     History   Social History  . Marital Status: Married    Spouse Name: N/A  . Number of Children: N/A  . Years of Education: N/A   Social History Main Topics  .  Smoking status: Current Every Day Smoker -- 0.02 packs/day for 40 years    Types: Cigarettes    Start date: 05/28/1970  . Smokeless tobacco: Never Used     Comment: 3 cigarettes daily  . Alcohol Use: 0.0 oz/week    0 Standard drinks or equivalent per week     Comment: 2-3 12 ounce beers daily. heavier in the past. DUI, 2010  . Drug Use: No     Comment: cocaine 2 months ago (04/2014).   Marland Kitchen Sexual Activity: Yes    Birth Control/ Protection: None   Other Topics Concern  . None   Social History Narrative     Review of Systems: General: Negative for anorexia, weight loss, fever, chills. Eyes: Negative for vision changes.  CV: Negative for chest pain, angina, palpitations, dyspnea on exertion, peripheral edema.  Respiratory: Negative for dyspnea at rest, dyspnea on exertion, cough, sputum, wheezing.  GI: See history of present illness. Heme: Negative for bruising or bleeding. Allergy: Negative for rash or hives.   Physical Exam: BP 125/88 mmHg  Pulse 87  Temp(Src) 97 F (36.1 C) (Oral)  Ht 5' 11"  (1.803 m)  Wt 165 lb 3.2 oz (74.934 kg)  BMI 23.05 kg/m2 General:   Alert and oriented. Pleasant and cooperative. Well-nourished and well-developed.  Head:  Normocephalic and atraumatic. Eyes:  Without icterus, sclera clear and conjunctiva pink.  Ears:  Normal auditory acuity. Cardiovascular:  S1, S2 present without murmurs appreciated. Normal pulses noted. Extremities without clubbing or edema. Respiratory:  Clear to auscultation bilaterally. No wheezes, rales, or rhonchi. No distress.  Gastrointestinal:  +BS, soft, non-tender and non-distended. No HSM noted. No guarding or rebound. No masses appreciated.  Rectal:  Deferred  Neurologic:  Alert and oriented x4;  grossly normal neurologically. Psych:  Alert and cooperative. Normal mood and affect. Heme/Lymph/Immune: No excessive bruising noted.    08/22/2014 10:11 AM

## 2014-08-30 ENCOUNTER — Telehealth: Payer: Self-pay | Admitting: Nurse Practitioner

## 2014-08-30 NOTE — Assessment & Plan Note (Signed)
Stated symptoms have since resolved since his EGD with 31 French Maloney dilation and treatment of H. pylori. Return as needed for GI symptoms.

## 2014-08-30 NOTE — Telephone Encounter (Signed)
It appears the patient's Hep C labs including genotype are back. Please schedule him a follow-up visit with Neil Crouch who previously began his evaluation of hep C treatment options to allow further recommendations now that his acute dysphagia and h. Pylori are resolved. Please notify the patient of the appointment day/time.

## 2014-08-30 NOTE — Assessment & Plan Note (Signed)
59 year old male with chronic hepatitis C and has been evaluated by another provider in this office for treatment. We'll refer the patient back for consideration of hep C treatment due to his desire to receive treatment.

## 2014-08-30 NOTE — Assessment & Plan Note (Signed)
GERD symptoms have resolved with Pylera treatment for H. pylori. It is okay to restart his normal medications including his Protonix as previously ordered. Return for follow-up as needed for GI symptoms. Return for follow-up on hepatitis C treatment options

## 2014-08-30 NOTE — Assessment & Plan Note (Signed)
Patient H pylori which was successfully treated with Pylera. GERD symptoms have since resolved. Still is not "feeling well." Does have some increased nausea. Can start a probiotic as he asked about this for healthy GI tract maintenance. Recommend align and one sample pack was given to last him a week to trial. Continue current meds including Protonix as previously ordered. Follow-up as needed for recurrent symptoms.

## 2014-09-02 NOTE — Telephone Encounter (Signed)
Called and spoke with pt. He is aware of recommendations. He is aware of his appointment with [facility]SL on 09/17/2014 @ 11:00am

## 2014-09-03 NOTE — Progress Notes (Signed)
CC'ED TO PCP 

## 2014-09-17 ENCOUNTER — Ambulatory Visit (INDEPENDENT_AMBULATORY_CARE_PROVIDER_SITE_OTHER): Payer: BLUE CROSS/BLUE SHIELD | Admitting: Gastroenterology

## 2014-09-17 ENCOUNTER — Encounter: Payer: Self-pay | Admitting: Gastroenterology

## 2014-09-17 VITALS — BP 138/95 | HR 88 | Temp 97.4°F | Ht 71.0 in | Wt 173.2 lb

## 2014-09-17 DIAGNOSIS — B182 Chronic viral hepatitis C: Secondary | ICD-10-CM

## 2014-09-17 DIAGNOSIS — K2100 Gastro-esophageal reflux disease with esophagitis, without bleeding: Secondary | ICD-10-CM

## 2014-09-17 NOTE — Patient Instructions (Signed)
1. Please have your ultrasound test to check how much fibrosis is in your liver.  2. We will start referral for Harvoni.

## 2014-09-17 NOTE — Assessment & Plan Note (Signed)
Improved. Recommended to patient that he sit upright at least 30 minutes after taking medications, at least 2-3 hours after eating. Patient has habit of taking morning medications, eating breakfast and going straight back to bed. According to his wife he typically complains more in the mornings with any type of GI symptoms but does find throughout the day when he sitting upright.

## 2014-09-17 NOTE — Progress Notes (Signed)
Primary Care Physician: Glo Herring., MD  Primary Gastroenterologist:  Garfield Cornea, MD   Chief Complaint  Patient presents with  . Follow-up    HPI: Bobby York is a 59 y.o. male here for follow-up. He was last seen June 2016 post procedure follow-up after colonoscopy and EGD which was done for dysphagia and weight loss, average risk colon cancer screening. Noted to have mild erosive reflux esophagitis, 56 French Maloney dilator used for dilation, gastric erosions, pathology showed active H. pylori. Patient completed Prevpac 14 days. Single benign rectal polyp. Next colonoscopy 10 years. Patient also had a CT scan to evaluate weight loss that showed small right benign adrenal adenoma but otherwise unremarkable.  History of chronic hepatitis C, remotely treated with interferon regimen but completed only 4 months of therapy. Recent HCV RNA was positive, genotype 1A. So far no evidence of cirrhosis based on recent imaging. Patient is interested in undergoing repeat treatment. He has really been working hard at cleaning up his act, per wife and patient. He used cocaine the last time was 5 months ago. History of prior alcohol abuse with DUIs, last one 2010. When I last saw him in May he was consuming 24-36 ounces of beer daily but was able to cut back with recommendations. Currently consumes 12-24 ounces of beer twice per week.  Patient reports he is 85-90% better. Intermittent nausea which improves if he eats. Notices if he delays eating lunch or supper he feels a little nauseated. Wife states he's eating everything in sight. He's gained 8 pounds since his last office visit. Denies heartburn. Bowel movements regular, at least every other day. No melena or rectal bleeding.   UDS 07/25/14: positive for benzos, tetrahydrocannabinol.  Current Outpatient Prescriptions  Medication Sig Dispense Refill  . ALPRAZolam (XANAX) 1 MG tablet Take 1 mg by mouth at bedtime. 1/2 tablet    .  buPROPion (WELLBUTRIN XL) 300 MG 24 hr tablet Take 300 mg by mouth daily. 1/2 tablet    . pantoprazole (PROTONIX) 40 MG tablet Take 40 mg by mouth daily.    . Probiotic Product (ALIGN) 4 MG CAPS Take 4 mg by mouth daily.     No current facility-administered medications for this visit.    Allergies as of 09/17/2014  . (No Known Allergies)    ROS:  General: Negative for anorexia, weight loss, fever, chills, fatigue, weakness. ENT: Negative for hoarseness, difficulty swallowing , nasal congestion. CV: Negative for chest pain, angina, palpitations, dyspnea on exertion, peripheral edema.  Respiratory: Negative for dyspnea at rest, dyspnea on exertion, cough, sputum, wheezing.  GI: See history of present illness. GU:  Negative for dysuria, hematuria, urinary incontinence, urinary frequency, nocturnal urination.  Endo: Negative for unusual weight change.    Physical Examination:   BP 138/95 mmHg  Pulse 88  Temp(Src) 97.4 F (36.3 C) (Oral)  Ht 5\' 11"  (1.803 m)  Wt 173 lb 3.2 oz (78.563 kg)  BMI 24.17 kg/m2  General: Well-nourished, well-developed in no acute distress.  Eyes: No icterus. Mouth: Oropharyngeal mucosa moist and pink , no lesions erythema or exudate. Lungs: Clear to auscultation bilaterally.  Heart: Regular rate and rhythm, no murmurs rubs or gallops.  Abdomen: Bowel sounds are normal, nontender, nondistended, no hepatosplenomegaly or masses, no abdominal bruits or hernia , no rebound or guarding.   Extremities: No lower extremity edema. No clubbing or deformities. Neuro: Alert and oriented x 4   Skin: Warm and dry, no jaundice.  Psych: Alert and cooperative, normal mood and affect.  Labs:  Lab Results  Component Value Date   INR 0.99 08/14/2014   Lab Results  Component Value Date   CREATININE 1.13 08/14/2014   BUN 15 08/14/2014   NA 141 08/14/2014   K 4.2 08/14/2014   CL 105 08/14/2014   CO2 26 08/14/2014   Lab Results  Component Value Date   ALT 25  08/14/2014   AST 28 08/14/2014   ALKPHOS 59 08/14/2014   BILITOT 0.7 08/14/2014   Lab Results  Component Value Date   WBC 5.6 07/19/2014   HGB 14.8 07/19/2014   HCT 44.2 07/19/2014   MCV 95.5 07/19/2014   PLT 297 07/19/2014   HCV RNA: 626,871 IU/mL, genotype 1a.  Imaging Studies: No results found.

## 2014-09-17 NOTE — Assessment & Plan Note (Signed)
Chronic hepatitis C, genotype Ia, recent RNA level 626,871 international units per mL. No evidence of cirrhosis on recent CT. Patient is 85-90% better. Appetite has returned. No problems swallowing. Bipolar disorder doing well at this time with adjustment of medications. Patient is ready to pursue hepatitis C treatment. We discussed need for compliance to medication regimen. We also discussed the need to notify our office if he has any changes in medications as some will interfere with Harvoni and decrease efficacy of the drug. We will start process of approval. Once treatment is started however we will have to make adjustments to PPI therapy in accordance to recommendations in the product information.   Will obtain elastography.

## 2014-09-19 ENCOUNTER — Other Ambulatory Visit: Payer: Self-pay | Admitting: Gastroenterology

## 2014-09-19 ENCOUNTER — Ambulatory Visit (HOSPITAL_COMMUNITY)
Admission: RE | Admit: 2014-09-19 | Discharge: 2014-09-19 | Disposition: A | Discharge status: Home / Self Care | Payer: BLUE CROSS/BLUE SHIELD | Source: Ambulatory Visit | Attending: Gastroenterology | Admitting: Gastroenterology

## 2014-09-19 DIAGNOSIS — B182 Chronic viral hepatitis C: Secondary | ICD-10-CM | POA: Diagnosis present

## 2014-09-19 NOTE — Progress Notes (Signed)
Quick Note:  Bobby York, let's start process for Harvoni approval.  Please let patient know he is moderate risk for fibrosis based on findings. ______

## 2014-09-24 NOTE — Progress Notes (Signed)
CC'ED TO PCP 

## 2014-09-30 ENCOUNTER — Other Ambulatory Visit: Payer: Self-pay | Admitting: Gastroenterology

## 2014-09-30 ENCOUNTER — Other Ambulatory Visit: Payer: Self-pay

## 2014-09-30 DIAGNOSIS — B182 Chronic viral hepatitis C: Secondary | ICD-10-CM

## 2014-09-30 NOTE — Addendum Note (Signed)
Addended by: Claudina Lick on: 09/30/2014 02:47 PM   Modules accepted: Orders

## 2014-09-30 NOTE — Progress Notes (Signed)
Quick Note:  Bobby York, based on recent information available, we also need to get a Fibrosure test. I want to make sure this guy doesn't have any indication of early cirrhosis since he was a previous treatment failure. ______

## 2014-10-04 ENCOUNTER — Other Ambulatory Visit: Payer: Self-pay | Admitting: Gastroenterology

## 2014-10-04 DIAGNOSIS — B182 Chronic viral hepatitis C: Secondary | ICD-10-CM | POA: Diagnosis not present

## 2014-10-07 LAB — PROMETHEUS-MAIL

## 2014-10-29 ENCOUNTER — Other Ambulatory Visit (HOSPITAL_COMMUNITY): Payer: Self-pay | Admitting: Family Medicine

## 2014-10-29 ENCOUNTER — Telehealth: Payer: Self-pay | Admitting: Internal Medicine

## 2014-10-29 ENCOUNTER — Ambulatory Visit (HOSPITAL_COMMUNITY)
Admission: RE | Admit: 2014-10-29 | Discharge: 2014-10-29 | Disposition: A | Discharge status: Home / Self Care | Payer: BLUE CROSS/BLUE SHIELD | Source: Ambulatory Visit | Attending: Family Medicine | Admitting: Family Medicine

## 2014-10-29 DIAGNOSIS — M25512 Pain in left shoulder: Secondary | ICD-10-CM

## 2014-10-29 DIAGNOSIS — Z6823 Body mass index (BMI) 23.0-23.9, adult: Secondary | ICD-10-CM | POA: Diagnosis not present

## 2014-10-29 DIAGNOSIS — Z1389 Encounter for screening for other disorder: Secondary | ICD-10-CM | POA: Diagnosis not present

## 2014-10-29 NOTE — Telephone Encounter (Signed)
[phone] OR [phone] PATIENT STATES HE IS STILL SICK AFTER TAKING ANTIBIOTICS.   PLEASE ADVISE ON HIS CELL

## 2014-10-29 NOTE — Telephone Encounter (Signed)
I spoke with the Bobby York- he said he has been feeling sick for a couple of months. Every time he eats it feels like the food gets stuck in this chest. He also said that he has to eat at certain times or he feels like he is going to pass out. He said he feels better when he doesn't take his wellbutrin and protonix. He is not sure if the medications are interacting with each other or not. He has not started any new medications and he is not eating anything out of the ordinary for him. This morning he had an egg and a piece of sausage, then the bottom of his chest started hurting and he had to go lay down. He eats when he gets up and at noon and at 7pm. He said if he doesn't eat at these times he feels like he will pass out. He said he didn't feel like it was a cardiac issue because it only happens when he eats. He is aware that it will be tomorrow before I will get back with him but should he get worse or pass out he should go to the ED.

## 2014-10-29 NOTE — Telephone Encounter (Signed)
Tried to call pt- NA- LMOM 

## 2014-10-30 NOTE — Telephone Encounter (Signed)
Reviewed chart including labs, CMET, CBC, TSH done within past couple of months. Nothing to explain his symptoms.   No noted drug interactions with Xanax, Wellbutrin, Protonix.   1. Would recommend BPE for dysphagia.  2. He needs a PPI (erosive esophagitis on recent EGD). If he wants to try a different one we can. 3. Recommend eating small snack in between meals. Do not go prolonged amount of time without eating. Let us know if symptoms don't improve.

## 2014-10-31 NOTE — Telephone Encounter (Signed)
Pt is aware. He said it was ok to set up BPE. Please schedule.  He doesn't want to change PPI right now. He will call me back and let me know if he does want to change.  Advised him to eat a small snack in between meals. He said he would try it.

## 2014-11-01 ENCOUNTER — Other Ambulatory Visit: Payer: Self-pay

## 2014-11-01 DIAGNOSIS — R1314 Dysphagia, pharyngoesophageal phase: Secondary | ICD-10-CM

## 2014-11-01 NOTE — Telephone Encounter (Signed)
Called pt and LMOM regarding BPE appt

## 2014-11-07 ENCOUNTER — Ambulatory Visit (HOSPITAL_COMMUNITY)
Admission: RE | Admit: 2014-11-07 | Discharge: 2014-11-07 | Disposition: A | Discharge status: Home / Self Care | Payer: BLUE CROSS/BLUE SHIELD | Source: Ambulatory Visit | Attending: Gastroenterology | Admitting: Gastroenterology

## 2014-11-07 DIAGNOSIS — R1310 Dysphagia, unspecified: Secondary | ICD-10-CM | POA: Diagnosis not present

## 2014-11-07 DIAGNOSIS — R1314 Dysphagia, pharyngoesophageal phase: Secondary | ICD-10-CM | POA: Insufficient documentation

## 2014-11-18 NOTE — Progress Notes (Signed)
Quick Note:  Please let patient know that his BPE was normal. No evidence of esophageal stricture.  Please have him make OV follow up with RMR only if he is still having problems. ______

## 2014-11-22 ENCOUNTER — Telehealth: Payer: Self-pay | Admitting: Gastroenterology

## 2014-11-22 NOTE — Telephone Encounter (Signed)
Bobby York, please process [employer] referral for Harvoni. Paper work on [employer], [employer]. I want the patient to come into the office before he starts medication but let's wait until we get approval before we make the appointment.

## 2014-11-25 NOTE — Telephone Encounter (Signed)
Paperwork has been faxed to [employer]

## 2014-11-26 DIAGNOSIS — S42032A Displaced fracture of lateral end of left clavicle, initial encounter for closed fracture: Secondary | ICD-10-CM | POA: Diagnosis not present

## 2014-12-06 DIAGNOSIS — K219 Gastro-esophageal reflux disease without esophagitis: Secondary | ICD-10-CM | POA: Diagnosis not present

## 2014-12-06 DIAGNOSIS — F17200 Nicotine dependence, unspecified, uncomplicated: Secondary | ICD-10-CM | POA: Diagnosis not present

## 2014-12-06 DIAGNOSIS — N529 Male erectile dysfunction, unspecified: Secondary | ICD-10-CM | POA: Diagnosis not present

## 2014-12-06 DIAGNOSIS — Z8249 Family history of ischemic heart disease and other diseases of the circulatory system: Secondary | ICD-10-CM | POA: Diagnosis not present

## 2014-12-06 DIAGNOSIS — S42032A Displaced fracture of lateral end of left clavicle, initial encounter for closed fracture: Secondary | ICD-10-CM | POA: Diagnosis not present

## 2014-12-06 DIAGNOSIS — Z833 Family history of diabetes mellitus: Secondary | ICD-10-CM | POA: Diagnosis not present

## 2014-12-06 DIAGNOSIS — Z79899 Other long term (current) drug therapy: Secondary | ICD-10-CM | POA: Diagnosis not present

## 2014-12-24 ENCOUNTER — Other Ambulatory Visit: Payer: Self-pay

## 2014-12-26 ENCOUNTER — Telehealth: Payer: Self-pay | Admitting: Internal Medicine

## 2014-12-26 MED ORDER — PANTOPRAZOLE SODIUM 40 MG PO TBEC: 40.0000 mg | DELAYED_RELEASE_TABLET | Freq: Every day | ORAL | Status: DC

## 2014-12-26 NOTE — Telephone Encounter (Signed)
Still waiting to hear from his insurance.

## 2014-12-26 NOTE — Telephone Encounter (Signed)
PATIENT CALLED CHECKING THE STATUS OF HIS HEP C TREATMENT

## 2014-12-30 ENCOUNTER — Other Ambulatory Visit: Payer: Self-pay

## 2014-12-30 MED ORDER — PANTOPRAZOLE SODIUM 40 MG PO TBEC: 40.0000 mg | DELAYED_RELEASE_TABLET | Freq: Every day | ORAL | Status: DC

## 2014-12-31 ENCOUNTER — Encounter: Payer: Self-pay | Admitting: Internal Medicine

## 2014-12-31 NOTE — Telephone Encounter (Signed)
Received approval today from [facility] . Harvoni has been approved. I have faxed this to [facility], which is the specialty pharmacy that the pt has to use. Waiting to hear from them about delivery to our office.

## 2014-12-31 NOTE — Telephone Encounter (Signed)
Yes. I think that would be good idea since we have not seen him since 08/2014.

## 2014-12-31 NOTE — Telephone Encounter (Signed)
Please schedule ov.  

## 2014-12-31 NOTE — Telephone Encounter (Signed)
Bobby Paganini, do you want pt to have an office visit now since this has been approved?

## 2014-12-31 NOTE — Telephone Encounter (Signed)
APPT MADE AND LETTER SENT  °

## 2015-01-13 ENCOUNTER — Telehealth (HOSPITAL_COMMUNITY): Payer: Self-pay

## 2015-01-13 NOTE — Telephone Encounter (Signed)
Opened in error

## 2015-01-20 ENCOUNTER — Ambulatory Visit: Payer: BLUE CROSS/BLUE SHIELD | Admitting: Gastroenterology

## 2015-01-22 NOTE — Telephone Encounter (Signed)
Open in error

## 2015-02-03 ENCOUNTER — Ambulatory Visit (INDEPENDENT_AMBULATORY_CARE_PROVIDER_SITE_OTHER): Payer: BLUE CROSS/BLUE SHIELD | Admitting: Gastroenterology

## 2015-02-03 ENCOUNTER — Encounter: Payer: Self-pay | Admitting: Gastroenterology

## 2015-02-03 VITALS — BP 119/78 | HR 92 | Temp 98.3°F | Ht 71.0 in | Wt 180.0 lb

## 2015-02-03 DIAGNOSIS — K2100 Gastro-esophageal reflux disease with esophagitis, without bleeding: Secondary | ICD-10-CM

## 2015-02-03 DIAGNOSIS — B182 Chronic viral hepatitis C: Secondary | ICD-10-CM

## 2015-02-03 MED ORDER — OMEPRAZOLE 20 MG PO CPDR: DELAYED_RELEASE_CAPSULE | ORAL | Status: DC

## 2015-02-03 NOTE — Progress Notes (Signed)
CC'D TO PCP °

## 2015-02-03 NOTE — Assessment & Plan Note (Signed)
Chronic HCV, genotype 1a, most recent RNA level 626,871 IU/mL. No evidence of cirrhosis. Plans for Harvoni 12 weeks of therapy. We have had multiple discussions with both patient and his wife about compliance with medication regimen, need to notify our office if there is any medication changes. These were reiterated today. We will go ahead and switch him from pantoprazole to omeprazole 20 mg daily to carry him at least through his [place] treatment. He is aware to take both Harvoni and omeprazole at the same time on empty stomach once daily. He was encouraged to stop all etoh use.   He will call when he gets his shipment. He will get updated labs now. Once he starts his medication we will see him back in the office in 4 weeks. He will call in interim with any questions.

## 2015-02-03 NOTE — Progress Notes (Signed)
Primary Care Physician: Glo Herring., MD  Primary Gastroenterologist:  Garfield Cornea, MD   Chief Complaint  Patient presents with  . Follow-up    HPI: Bobby York is a 59 y.o. male here to start HCV treatment. He has been approved for Harvoni.   History of chronic hepatitis C, genotype 1A, remotely treated with interferon regimen but completed only 4 months of therapy.  So far no evidence of cirrhosis based on recent imaging. He has not used cocaine since 02/2014. Still consuming etoh daily, 2-4 beers daily. Mostly 2 per day per wife. Willing to cut back further. Had surgery for collarbone fracture since we last saw him. Doing well.   Patient states he does well from a GI standpoint as long as he stays on his pantoprazole. No issues with appetite or abdominal pain. BM regular. No melena, brbpr. Started back on Abilify recently under direction of Dr. Wonda Amis.   Encouraged wife to have testing for Hep C updated. She agreed and will discuss with PCP.  Current Outpatient Prescriptions  Medication Sig Dispense Refill  . ALPRAZolam (XANAX) 1 MG tablet Take 1 mg by mouth at bedtime. 1/2 tablet    . ARIPiprazole (ABILIFY) 5 MG tablet   0  . pantoprazole (PROTONIX) 40 MG tablet Take 1 tablet (40 mg total) by mouth daily. 30 tablet 11  . Probiotic Product (ALIGN) 4 MG CAPS Take 4 mg by mouth daily.     No current facility-administered medications for this visit.    Allergies as of 02/03/2015  . (No Known Allergies)   Past Medical History  Diagnosis Date  . Bipolar 1 disorder ([place])     diagnosed 2004  . Hepatitis     Hep C   Past Surgical History  Procedure Laterality Date  . Back surgery      two-lumbar disc x2  . Colonoscopy with propofol N/A 07/25/2014    [id number] rectal polyp otherwise normal . hyperplastic polyp. TCS in 07/2024  . Esophagogastroduodenoscopy (egd) with propofol N/A 07/25/2014    [id number] erosive reflux/s/p dilator/HH. +hpylor   . Esophageal  dilation N/A 07/25/2014    Procedure: ESOPHAGEAL DILATION;  Surgeon: Daneil Dolin, MD;  Location: [facility];  Service: Endoscopy;  Laterality: N/A;  Malony dilators # 56,    ROS:  General: Negative for anorexia, weight loss, fever, chills, fatigue, weakness. ENT: Negative for hoarseness, difficulty swallowing , nasal congestion. CV: Negative for chest pain, angina, palpitations, dyspnea on exertion, peripheral edema.  Respiratory: Negative for dyspnea at rest, dyspnea on exertion, cough, sputum, wheezing.  GI: See history of present illness. GU:  Negative for dysuria, hematuria, urinary incontinence, urinary frequency, nocturnal urination.  Endo: Negative for unusual weight change.    Physical Examination:   BP 119/78 mmHg  Pulse 92  Temp(Src) 98.3 F (36.8 C) (Oral)  Ht 5\' 11"  (1.803 m)  Wt 180 lb (81.647 kg)  BMI 25.12 kg/m2  General: Well-nourished, well-developed in no acute distress.  Eyes: No icterus. Mouth: Oropharyngeal mucosa moist and pink , no lesions erythema or exudate. Lungs: Clear to auscultation bilaterally.  Heart: Regular rate and rhythm, no murmurs rubs or gallops.  Abdomen: Bowel sounds are normal, nontender, nondistended, no hepatosplenomegaly or masses, no abdominal bruits or hernia , no rebound or guarding.   Extremities: No lower extremity edema. No clubbing or deformities. Neuro: Alert and oriented x 4   Skin: Warm and dry, no jaundice.   Psych: Alert and cooperative, normal mood  and affect.

## 2015-02-03 NOTE — Patient Instructions (Signed)
1. We will stop pantoprazole and began omeprazole 20 mg daily. You will need to take omeprazole 30 minutes before your last meal of  the day. Please take WITH Harvoni at the same exact time on an empty stomach. 2. I will have our nurse contact pharmacy about [place] shipment. Once you receive your medication call our office before starting it. You will need to be seen in the office four weeks after starting medication.  3. It is VERY important to notify us of ALL medication changes while on Harvoni. You must take Harvoni at the same time every single day. Do not skip doses. Call us if you have any questions or concerns.  4. You need to avoid all alcohol.

## 2015-02-04 LAB — CBC WITH DIFFERENTIAL/PLATELET
BASOS ABS: 0 10*3/uL (ref 0.0–0.1)
Basophils Relative: 0 % (ref 0–1)
EOS PCT: 2 % (ref 0–5)
Eosinophils Absolute: 0.1 10*3/uL (ref 0.0–0.7)
HCT: 41.5 % (ref 39.0–52.0)
HEMOGLOBIN: 14.1 g/dL (ref 13.0–17.0)
LYMPHS ABS: 1.8 10*3/uL (ref 0.7–4.0)
LYMPHS PCT: 36 % (ref 12–46)
MCH: 32 pg (ref 26.0–34.0)
MCHC: 34 g/dL (ref 30.0–36.0)
MCV: 94.3 fL (ref 78.0–100.0)
MPV: 9.6 fL (ref 8.6–12.4)
Monocytes Absolute: 0.4 10*3/uL (ref 0.1–1.0)
Monocytes Relative: 7 % (ref 3–12)
NEUTROS ABS: 2.8 10*3/uL (ref 1.7–7.7)
Neutrophils Relative %: 55 % (ref 43–77)
Platelets: 266 10*3/uL (ref 150–400)
RBC: 4.4 MIL/uL (ref 4.22–5.81)
RDW: 12.9 % (ref 11.5–15.5)
WBC: 5.1 10*3/uL (ref 4.0–10.5)

## 2015-02-05 LAB — HIV ANTIBODY (ROUTINE TESTING W REFLEX): HIV: NONREACTIVE

## 2015-02-05 LAB — COMPREHENSIVE METABOLIC PANEL
ALT: 23 U/L (ref 9–46)
AST: 33 U/L (ref 10–35)
Albumin: 4.2 g/dL (ref 3.6–5.1)
Alkaline Phosphatase: 60 U/L (ref 40–115)
BUN: 13 mg/dL (ref 7–25)
CO2: 25 mmol/L (ref 20–31)
CREATININE: 0.93 mg/dL (ref 0.70–1.25)
Calcium: 9.6 mg/dL (ref 8.6–10.3)
Chloride: 101 mmol/L (ref 98–110)
Glucose, Bld: 82 mg/dL (ref 65–99)
Potassium: 4.1 mmol/L (ref 3.5–5.3)
SODIUM: 138 mmol/L (ref 135–146)
Total Bilirubin: 0.5 mg/dL (ref 0.2–1.2)
Total Protein: 7.1 g/dL (ref 6.1–8.1)

## 2015-02-05 LAB — PROTIME-INR
INR: 0.92 (ref <1.50)
Prothrombin Time: 12.5 seconds (ref 11.6–15.2)

## 2015-02-05 LAB — HEPATITIS B SURFACE ANTIBODY,QUALITATIVE: HEP B S AB: NEGATIVE

## 2015-02-05 LAB — HEPATITIS A ANTIBODY, TOTAL: HEP A TOTAL AB: NONREACTIVE

## 2015-02-05 LAB — HEPATITIS B SURFACE ANTIGEN: HEP B S AG: NEGATIVE

## 2015-02-12 NOTE — Progress Notes (Signed)
Quick Note:  His labs are good.  What is status of his Harvoni shipment? ______

## 2015-03-01 NOTE — Progress Notes (Signed)
Quick Note:  Let's follow up to see if he is ready to start [place]. We don't recommend he postponing for much longer. ______

## 2015-03-03 ENCOUNTER — Telehealth: Payer: Self-pay | Admitting: Internal Medicine

## 2015-03-03 ENCOUNTER — Telehealth: Payer: Self-pay | Admitting: Gastroenterology

## 2015-03-03 NOTE — Telephone Encounter (Signed)
Pt called to say he needs his Harvoni called in. [phone]

## 2015-03-03 NOTE — Telephone Encounter (Signed)
To Stacy to schedule the OV.

## 2015-03-03 NOTE — Telephone Encounter (Signed)
I spoke to pt. He has his Harvoni and was calling to let us know that he waited til the holidays were over to start. I spoke to Neil Crouch, PA and she said to just remind him that he is to take at the same time everyday. I spoke back to pt and he is aware of that and said he will start it at 6:00 pm today.

## 2015-03-03 NOTE — Telephone Encounter (Signed)
Patient came by to update his medication list before starting Harvoni today. All new medications added to list. No interactions with Harvoni. May start medication as planned.

## 2015-03-03 NOTE — Telephone Encounter (Signed)
Pt is aware Ok to take the medications and his Harvoni.

## 2015-03-03 NOTE — Progress Notes (Signed)
Quick Note:  See phone note of 03/03/2015. ______

## 2015-03-03 NOTE — Telephone Encounter (Signed)
Noted. Needs OV in 4 weeks with me.

## 2015-03-04 ENCOUNTER — Encounter: Payer: Self-pay | Admitting: Internal Medicine

## 2015-03-04 NOTE — Telephone Encounter (Signed)
APPT MADE AND LETTER SENT  °

## 2015-03-07 ENCOUNTER — Emergency Department (HOSPITAL_COMMUNITY)
Admission: EM | Admit: 2015-03-07 | Discharge: 2015-03-08 | Disposition: A | Discharge status: Home / Self Care | Payer: BLUE CROSS/BLUE SHIELD | Attending: Emergency Medicine | Admitting: Emergency Medicine

## 2015-03-07 ENCOUNTER — Encounter (HOSPITAL_COMMUNITY): Payer: Self-pay | Admitting: *Deleted

## 2015-03-07 DIAGNOSIS — F17210 Nicotine dependence, cigarettes, uncomplicated: Secondary | ICD-10-CM | POA: Insufficient documentation

## 2015-03-07 DIAGNOSIS — F319 Bipolar disorder, unspecified: Secondary | ICD-10-CM | POA: Insufficient documentation

## 2015-03-07 DIAGNOSIS — R0981 Nasal congestion: Secondary | ICD-10-CM | POA: Diagnosis not present

## 2015-03-07 DIAGNOSIS — R040 Epistaxis: Secondary | ICD-10-CM | POA: Diagnosis not present

## 2015-03-07 DIAGNOSIS — Z7982 Long term (current) use of aspirin: Secondary | ICD-10-CM | POA: Diagnosis not present

## 2015-03-07 DIAGNOSIS — Z79899 Other long term (current) drug therapy: Secondary | ICD-10-CM | POA: Diagnosis not present

## 2015-03-07 DIAGNOSIS — R0982 Postnasal drip: Secondary | ICD-10-CM | POA: Insufficient documentation

## 2015-03-07 DIAGNOSIS — Z8719 Personal history of other diseases of the digestive system: Secondary | ICD-10-CM | POA: Diagnosis not present

## 2015-03-07 LAB — COMPREHENSIVE METABOLIC PANEL
ALT: 23 U/L (ref 17–63)
ANION GAP: 9 (ref 5–15)
AST: 35 U/L (ref 15–41)
Albumin: 4.1 g/dL (ref 3.5–5.0)
Alkaline Phosphatase: 60 U/L (ref 38–126)
BUN: 11 mg/dL (ref 6–20)
CHLORIDE: 104 mmol/L (ref 101–111)
CO2: 27 mmol/L (ref 22–32)
CREATININE: 1.02 mg/dL (ref 0.61–1.24)
Calcium: 9.6 mg/dL (ref 8.9–10.3)
Glucose, Bld: 84 mg/dL (ref 65–99)
POTASSIUM: 4.2 mmol/L (ref 3.5–5.1)
SODIUM: 140 mmol/L (ref 135–145)
Total Bilirubin: 0.7 mg/dL (ref 0.3–1.2)
Total Protein: 7.6 g/dL (ref 6.5–8.1)

## 2015-03-07 LAB — CBC WITH DIFFERENTIAL/PLATELET
Basophils Absolute: 0 10*3/uL (ref 0.0–0.1)
Basophils Relative: 0 %
EOS ABS: 0.2 10*3/uL (ref 0.0–0.7)
EOS PCT: 3 %
HCT: 41.3 % (ref 39.0–52.0)
Hemoglobin: 13.8 g/dL (ref 13.0–17.0)
LYMPHS ABS: 2.5 10*3/uL (ref 0.7–4.0)
LYMPHS PCT: 34 %
MCH: 32.5 pg (ref 26.0–34.0)
MCHC: 33.4 g/dL (ref 30.0–36.0)
MCV: 97.2 fL (ref 78.0–100.0)
MONO ABS: 0.6 10*3/uL (ref 0.1–1.0)
Monocytes Relative: 9 %
Neutro Abs: 3.9 10*3/uL (ref 1.7–7.7)
Neutrophils Relative %: 54 %
PLATELETS: 295 10*3/uL (ref 150–400)
RBC: 4.25 MIL/uL (ref 4.22–5.81)
RDW: 12.6 % (ref 11.5–15.5)
WBC: 7.3 10*3/uL (ref 4.0–10.5)

## 2015-03-07 LAB — PROTIME-INR
INR: 1.04 (ref 0.00–1.49)
Prothrombin Time: 13.8 seconds (ref 11.6–15.2)

## 2015-03-07 MED ORDER — OXYMETAZOLINE HCL 0.05 % NA SOLN
NASAL | Status: AC
  Administered 2015-03-08: 1 via NASAL
  Filled 2015-03-07: qty 15

## 2015-03-07 MED ORDER — SILVER NITRATE-POT NITRATE 75-25 % EX MISC
1.0000 | Freq: Once | CUTANEOUS | Status: AC
  Administered 2015-03-08: 1 via TOPICAL

## 2015-03-07 MED ORDER — OXYMETAZOLINE HCL 0.05 % NA SOLN
1.0000 | Freq: Once | NASAL | Status: AC
  Administered 2015-03-08: 1 via NASAL

## 2015-03-07 MED ORDER — SILVER NITRATE-POT NITRATE 75-25 % EX MISC
CUTANEOUS | Status: AC
  Administered 2015-03-08: 1 via TOPICAL
  Filled 2015-03-07: qty 1

## 2015-03-07 NOTE — ED Notes (Addendum)
Pt states 3rd nose bleed since ~0100 mostly to right nares. States bleeding lasted 10-15 min each time. Pts wife states he has had cold symptoms this week and he has been blowing his nose a lot. Pt also states headache across forehead. Bleeding has stopped at this time. Pt also states he began new med for Hep C on Monday.

## 2015-03-07 NOTE — ED Notes (Signed)
Called lab to remind them of orders pending for over an hour. Was told they would they would tell phlebotomist.

## 2015-03-07 NOTE — ED Provider Notes (Signed)
CSN: [id number]     Arrival date & time 03/07/15  1700 History  By signing my name below, I, Emmanuella Mensah, attest that this documentation has been prepared under the direction and in the presence of Tanna Furry, MD. Electronically Signed: Judithann Sauger, ED Scribe. 03/07/2015. 9:54 PM.      Chief Complaint  Patient presents with  . Epistaxis   The history is provided by the patient. No language interpreter was used.   HPI Comments: Bobby York is a 60 y.o. male who presents to the Emergency Department complaining of ongoing intermittent right nosebleeds that lasts for approx 10-15 minutes onset last night. He reports associated post nasal drip. Pt is not actively bleeding. He explains that he had nasal congestion earlier this week and has been blowing his nose a lot lately. He denies any hx of hematuria, blood in stool, or any blood disorders. Pt was started on a new medication for Hep C 4 days ago.   Past Medical History  Diagnosis Date  . Bipolar 1 disorder ([place])     diagnosed 2004  . Hepatitis     Hep C   Past Surgical History  Procedure Laterality Date  . Back surgery      two-lumbar disc x2  . Colonoscopy with propofol N/A 07/25/2014    [id number] rectal polyp otherwise normal . hyperplastic polyp. TCS in 07/2024  . Esophagogastroduodenoscopy (egd) with propofol N/A 07/25/2014    [id number] erosive reflux/s/p dilator/HH. +hpylor   . Esophageal dilation N/A 07/25/2014    Procedure: ESOPHAGEAL DILATION;  Surgeon: Daneil Dolin, MD;  Location: [facility];  Service: Endoscopy;  Laterality: N/A;  Malony dilators # 25,   Family History  Problem Relation Age of Onset  . Liver disease Neg Hx   . Colon cancer Neg Hx   . Heart disease Mother   . Prostate cancer Father   . Diabetes Mother    Social History  Substance Use Topics  . Smoking status: Current Every Day Smoker -- 0.02 packs/day for 40 years    Types: Cigarettes    Start date: 05/28/1970  . Smokeless tobacco: Never Used      Comment: 3 cigarettes daily  . Alcohol Use: 0.0 oz/week    0 Standard drinks or equivalent per week     Comment: Occ    Review of Systems  Constitutional: Negative for fever, chills, diaphoresis, appetite change and fatigue.  HENT: Positive for nosebleeds and postnasal drip. Negative for mouth sores, sore throat and trouble swallowing.   Eyes: Negative for visual disturbance.  Respiratory: Negative for cough, chest tightness, shortness of breath and wheezing.   Cardiovascular: Negative for chest pain.  Gastrointestinal: Negative for nausea, vomiting, abdominal pain, diarrhea and abdominal distention.  Endocrine: Negative for polydipsia, polyphagia and polyuria.  Genitourinary: Negative for dysuria, frequency and hematuria.  Musculoskeletal: Negative for gait problem.  Skin: Negative for color change, pallor and rash.  Neurological: Negative for dizziness, syncope, light-headedness and headaches.  Hematological: Does not bruise/bleed easily.  Psychiatric/Behavioral: Negative for behavioral problems and confusion.      Allergies  Review of patient's allergies indicates no known allergies.  Home Medications   Prior to Admission medications   Medication Sig Start Date End Date Taking? Authorizing Provider  ALPRAZolam Duanne Moron) 1 MG tablet Take 1 mg by mouth at bedtime. May take twice daily as needed for anxiety   Yes Historical Provider, MD  ARIPiprazole (ABILIFY) 5 MG tablet Take 5 mg by mouth daily.  01/31/15  Yes Historical Provider, MD  aspirin 325 MG tablet Take 325 mg by mouth once as needed for mild pain or moderate pain.   Yes Historical Provider, MD  guaiFENesin-dextromethorphan (ROBITUSSIN DM) 100-10 MG/5ML syrup Take 5 mLs by mouth every 4 (four) hours as needed for cough.   Yes Historical Provider, MD  Ledipasvir-Sofosbuvir (HARVONI) 90-400 MG TABS Take 1 tablet by mouth every evening.   Yes Historical Provider, MD  mirtazapine (REMERON) 15 MG tablet Take 15 mg by mouth  at bedtime.   Yes Historical Provider, MD  naproxen sodium (ALEVE) 220 MG tablet Take 440 mg by mouth daily as needed (FOR PAIN).   Yes Historical Provider, MD  omeprazole (PRILOSEC) 20 MG capsule Take one capsule 30 minutes before evening meal AT SAME TIME AS HARVONI. 02/03/15  Yes Mahala Menghini, PA-C  Phenyleph-CPM-DM-Aspirin (ALKA-SELTZER PLUS COLD & COUGH) 7.09-23-08-325 MG TBEF Take 1 tablet by mouth daily as needed (FOR COUGH AND COLD).   Yes Historical Provider, MD  amoxicillin (AMOXIL) 500 MG capsule Take 1 capsule (500 mg total) by mouth 3 (three) times daily. 03/08/15   Tanna Furry, MD  VIAGRA 100 MG tablet Take 100 mg by mouth as needed for erectile dysfunction.  03/03/15   Historical Provider, MD   BP 158/108 mmHg  Pulse 76  Temp(Src) 98.9 F (37.2 C) (Oral)  Resp 18  Ht 5\' 11"  (1.803 m)  Wt 192 lb (87.091 kg)  BMI 26.79 kg/m2  SpO2 98% Physical Exam  Constitutional: He is oriented to person, place, and time. He appears well-developed and well-nourished. No distress.  HENT:  Head: Normocephalic.  Right medial septum with excoriation and single dominate vessel.    Eyes: Conjunctivae are normal. Pupils are equal, round, and reactive to light. No scleral icterus.  Neck: Normal range of motion. Neck supple. No thyromegaly present.  Cardiovascular: Normal rate and regular rhythm.  Exam reveals no gallop and no friction rub.   No murmur heard. Pulmonary/Chest: Effort normal and breath sounds normal. No respiratory distress. He has no wheezes. He has no rales.  Abdominal: Soft. Bowel sounds are normal. He exhibits no distension. There is no tenderness. There is no rebound.  Musculoskeletal: Normal range of motion.  Neurological: He is alert and oriented to person, place, and time.  Skin: Skin is warm and dry. No rash noted.  Psychiatric: He has a normal mood and affect. His behavior is normal.    ED Course  Procedures (including critical care time) DIAGNOSTIC STUDIES: Oxygen  Saturation is 98% on RA, normal by my interpretation.    COORDINATION OF CARE: 9:42 PM- Pt advised of plan for treatment and pt agrees. Pt will receive blood work for further evaluation.    Labs Review Labs Reviewed  PROTIME-INR  CBC WITH DIFFERENTIAL/PLATELET  COMPREHENSIVE METABOLIC PANEL    Tanna Furry, MD has personally reviewed and evaluated these lb results as part of his medical decision-making.   MDM   Final diagnoses:  Epistaxis    Cotton instilled with Neo-Synephrine placed in the anterior nares. On reinspection there is a single dominant vessel and some excoriation as well. With simple application of silver nitrate there is immediate and marked bleeding noted. Cotton replaced in the nose with additional Neo-Synephrine. Then reinspected. Merocel sponge placed in the nose. Continued bleeding of 45 minutes. Sponge is removed and anterior small balloon placed. Excellent hemostasis and sues. Recheck in 30 minutes was no anterior posterior left-sided bleeding. Plan is discharge home. Amoxicillin for  sinusitis prophylaxis. ENT follow-up.  I personally performed the services described in this documentation, which was scribed in my presence. The recorded information has been reviewed and is accurate.    Tanna Furry, MD 03/08/15 [phone]

## 2015-03-08 DIAGNOSIS — R040 Epistaxis: Secondary | ICD-10-CM | POA: Diagnosis not present

## 2015-03-08 MED ORDER — AMOXICILLIN 500 MG PO CAPS: 500.0000 mg | ORAL_CAPSULE | Freq: Three times a day (TID) | ORAL | Status: DC

## 2015-03-08 NOTE — Discharge Instructions (Signed)

## 2015-03-11 ENCOUNTER — Telehealth: Payer: Self-pay | Admitting: General Practice

## 2015-03-11 NOTE — Telephone Encounter (Signed)
Patient called in and wanted to let Magda Paganini know he is taking Amoxicillin and Sildenafil both prescribed by other doctors and he wanted to make sure it's ok to take this.  Routing to [place].

## 2015-03-11 NOTE — Telephone Encounter (Signed)
Both of those medications are okay with Harvoni. I appreciate his letting us know of medication changes!

## 2015-03-12 NOTE — Telephone Encounter (Signed)
Patient made aware of LL's response

## 2015-04-08 ENCOUNTER — Ambulatory Visit (INDEPENDENT_AMBULATORY_CARE_PROVIDER_SITE_OTHER): Payer: BLUE CROSS/BLUE SHIELD | Admitting: Gastroenterology

## 2015-04-08 ENCOUNTER — Encounter: Payer: Self-pay | Admitting: Gastroenterology

## 2015-04-08 VITALS — BP 164/105 | HR 71 | Temp 97.8°F | Ht 71.0 in | Wt 191.0 lb

## 2015-04-08 DIAGNOSIS — B182 Chronic viral hepatitis C: Secondary | ICD-10-CM

## 2015-04-08 LAB — CBC WITH DIFFERENTIAL/PLATELET
Basophils Absolute: 0 10*3/uL (ref 0.0–0.1)
Basophils Relative: 0 % (ref 0–1)
EOS PCT: 3 % (ref 0–5)
Eosinophils Absolute: 0.2 10*3/uL (ref 0.0–0.7)
HEMATOCRIT: 42.3 % (ref 39.0–52.0)
Hemoglobin: 13.9 g/dL (ref 13.0–17.0)
LYMPHS ABS: 2 10*3/uL (ref 0.7–4.0)
LYMPHS PCT: 33 % (ref 12–46)
MCH: 31.1 pg (ref 26.0–34.0)
MCHC: 32.9 g/dL (ref 30.0–36.0)
MCV: 94.6 fL (ref 78.0–100.0)
MONO ABS: 0.5 10*3/uL (ref 0.1–1.0)
MPV: 9.1 fL (ref 8.6–12.4)
Monocytes Relative: 8 % (ref 3–12)
Neutro Abs: 3.5 10*3/uL (ref 1.7–7.7)
Neutrophils Relative %: 56 % (ref 43–77)
PLATELETS: 260 10*3/uL (ref 150–400)
RBC: 4.47 MIL/uL (ref 4.22–5.81)
RDW: 12.5 % (ref 11.5–15.5)
WBC: 6.2 10*3/uL (ref 4.0–10.5)

## 2015-04-08 LAB — COMPREHENSIVE METABOLIC PANEL
ALT: 23 U/L (ref 9–46)
AST: 26 U/L (ref 10–35)
Albumin: 3.8 g/dL (ref 3.6–5.1)
Alkaline Phosphatase: 61 U/L (ref 40–115)
BUN: 12 mg/dL (ref 7–25)
CALCIUM: 9.6 mg/dL (ref 8.6–10.3)
CHLORIDE: 101 mmol/L (ref 98–110)
CO2: 28 mmol/L (ref 20–31)
Creat: 1.1 mg/dL (ref 0.70–1.25)
GLUCOSE: 95 mg/dL (ref 65–99)
POTASSIUM: 3.9 mmol/L (ref 3.5–5.3)
Sodium: 136 mmol/L (ref 135–146)
Total Bilirubin: 0.5 mg/dL (ref 0.2–1.2)
Total Protein: 6.8 g/dL (ref 6.1–8.1)

## 2015-04-08 NOTE — Patient Instructions (Signed)
1. Please have your labs done today. We will call you with results within the next 5-7 business days.  2. Remember to call us if your medications change to make sure there are no interactions with the Harvoni. 3. Return to the office in 8 weeks.

## 2015-04-08 NOTE — Progress Notes (Signed)
cc'ed to pcp °

## 2015-04-08 NOTE — Progress Notes (Signed)
      Primary Care Physician: Glo Herring., MD  Primary Gastroenterologist:  Garfield Cornea, MD   Chief Complaint  Patient presents with  . Follow-up    HPI: Bobby York is a 60 y.o. male here for follow-up of hepatitis C, genotype 1A, remotely treated with interferon regimen but only completed 4 months of therapy. No evidence of cirrhosis. He has completed approximately 5 weeks of Harvoni. Reports he feels very well. Better than he's felt a long time. He reports not missing any doses of his medication. He has had no alcohol in 2017. Denies abdominal pain, heartburn, melena, rectal bleeding, anorexia, vomiting.  Current Outpatient Prescriptions  Medication Sig Dispense Refill  . ALPRAZolam (XANAX) 1 MG tablet Take 1 mg by mouth at bedtime. May take twice daily as needed for anxiety    . ARIPiprazole (ABILIFY) 5 MG tablet Take 5 mg by mouth daily.   0  . Ledipasvir-Sofosbuvir (HARVONI) 90-400 MG TABS Take 1 tablet by mouth every evening.    . mirtazapine (REMERON) 15 MG tablet Take 15 mg by mouth at bedtime.    Marland Kitchen omeprazole (PRILOSEC) 20 MG capsule Take one capsule 30 minutes before evening meal AT SAME TIME AS HARVONI. 90 capsule 1  . VIAGRA 100 MG tablet Take 100 mg by mouth as needed for erectile dysfunction.   10   No current facility-administered medications for this visit.    Allergies as of 04/08/2015  . (No Known Allergies)    ROS:  General: Negative for anorexia, weight loss, fever, chills, fatigue, weakness. ENT: Negative for hoarseness, difficulty swallowing , nasal congestion. CV: Negative for chest pain, angina, palpitations, dyspnea on exertion, peripheral edema.  Respiratory: Negative for dyspnea at rest, dyspnea on exertion, cough, sputum, wheezing.  GI: See history of present illness. GU:  Negative for dysuria, hematuria, urinary incontinence, urinary frequency, nocturnal urination.  Endo: Negative for unusual weight change.    Physical  Examination:   BP 164/105 mmHg  Pulse 71  Temp(Src) 97.8 F (36.6 C)  Ht 5\' 11"  (1.803 m)  Wt 191 lb (86.637 kg)  BMI 26.65 kg/m2  General: Well-nourished, well-developed in no acute distress.  Eyes: No icterus. Mouth: Oropharyngeal mucosa moist and pink , no lesions erythema or exudate. Lungs: Clear to auscultation bilaterally.  Heart: Regular rate and rhythm, no murmurs rubs or gallops.  Abdomen: Bowel sounds are normal, nontender, nondistended, no hepatosplenomegaly or masses, no abdominal bruits or hernia , no rebound or guarding.   Extremities: No lower extremity edema. No clubbing or deformities. Neuro: Alert and oriented x 4   Skin: Warm and dry, no jaundice.   Psych: Alert and cooperative, normal mood and affect.

## 2015-04-08 NOTE — Assessment & Plan Note (Addendum)
Doing very well on treatment. Has completed a proximally 5 weeks of Harvoni. We will obtain labs today including CBC, CMET, HCV RNA. Reiterated the need to inform us of any medication changes to ensure there is no interaction with Harvoni. Continue to abstain from alcohol use. He will return to the office in 8 weeks or call sooner if needed.  As an aside, patient reports his BP was 130/90 this morning. Up in the office. He checks regularly at home. Advised to keep log and touch base with PCP if runs over 140/90 consistently.

## 2015-04-09 LAB — HEPATITIS C RNA QUANTITATIVE: HCV Quantitative: NOT DETECTED IU/mL (ref <15)

## 2015-04-10 ENCOUNTER — Ambulatory Visit (INDEPENDENT_AMBULATORY_CARE_PROVIDER_SITE_OTHER): Payer: BLUE CROSS/BLUE SHIELD | Admitting: Otolaryngology

## 2015-04-10 NOTE — Progress Notes (Signed)
Quick Note:  Please let patient know that his LFTs are normal. CBC normal. His HCV is UNDETECTABLE!  Continue Harvoni.  Repeat HCV RNA quantitative in 8 weeks. Repeat CBC/CMET in 8 weeks.  OV in 8 weeks. ______

## 2015-04-15 ENCOUNTER — Other Ambulatory Visit: Payer: Self-pay | Admitting: Gastroenterology

## 2015-04-15 DIAGNOSIS — B182 Chronic viral hepatitis C: Secondary | ICD-10-CM

## 2015-05-23 ENCOUNTER — Other Ambulatory Visit: Payer: Self-pay

## 2015-05-23 DIAGNOSIS — B182 Chronic viral hepatitis C: Secondary | ICD-10-CM

## 2015-06-02 LAB — COMPREHENSIVE METABOLIC PANEL
ALBUMIN: 3.9 g/dL (ref 3.6–5.1)
ALK PHOS: 68 U/L (ref 40–115)
ALT: 16 U/L (ref 9–46)
AST: 19 U/L (ref 10–35)
BILIRUBIN TOTAL: 0.6 mg/dL (ref 0.2–1.2)
BUN: 10 mg/dL (ref 7–25)
CALCIUM: 9.4 mg/dL (ref 8.6–10.3)
CO2: 28 mmol/L (ref 20–31)
Chloride: 104 mmol/L (ref 98–110)
Creat: 1.06 mg/dL (ref 0.70–1.25)
Glucose, Bld: 108 mg/dL — ABNORMAL HIGH (ref 65–99)
Potassium: 4.4 mmol/L (ref 3.5–5.3)
Sodium: 139 mmol/L (ref 135–146)
Total Protein: 6.7 g/dL (ref 6.1–8.1)

## 2015-06-02 LAB — CBC WITH DIFFERENTIAL/PLATELET
BASOS ABS: 0 {cells}/uL (ref 0–200)
Basophils Relative: 0 %
EOS ABS: 522 {cells}/uL — AB (ref 15–500)
Eosinophils Relative: 9 %
HEMATOCRIT: 44.6 % (ref 38.5–50.0)
HEMOGLOBIN: 14.8 g/dL (ref 13.2–17.1)
LYMPHS ABS: 2262 {cells}/uL (ref 850–3900)
Lymphocytes Relative: 39 %
MCH: 30.5 pg (ref 27.0–33.0)
MCHC: 33.2 g/dL (ref 32.0–36.0)
MCV: 91.8 fL (ref 80.0–100.0)
MONO ABS: 406 {cells}/uL (ref 200–950)
MPV: 9.7 fL (ref 7.5–12.5)
Monocytes Relative: 7 %
NEUTROS PCT: 45 %
Neutro Abs: 2610 cells/uL (ref 1500–7800)
Platelets: 318 10*3/uL (ref 140–400)
RBC: 4.86 MIL/uL (ref 4.20–5.80)
RDW: 13 % (ref 11.0–15.0)
WBC: 5.8 10*3/uL (ref 3.8–10.8)

## 2015-06-03 LAB — HEPATITIS C RNA QUANTITATIVE: HCV Quantitative: NOT DETECTED IU/mL (ref <15)

## 2015-06-04 NOTE — Progress Notes (Signed)
Quick Note:  HCV RNA remains undetectable. Recommend recheck in 3 months post treatment (I believe this is right at end of treatment, currently). Then again at 1 year. Further recommendations per Magda Paganini. ______

## 2015-06-09 ENCOUNTER — Ambulatory Visit (INDEPENDENT_AMBULATORY_CARE_PROVIDER_SITE_OTHER): Payer: BLUE CROSS/BLUE SHIELD | Admitting: Gastroenterology

## 2015-06-09 ENCOUNTER — Encounter: Payer: Self-pay | Admitting: Gastroenterology

## 2015-06-09 VITALS — BP 140/98 | HR 79 | Ht 71.0 in | Wt 193.2 lb

## 2015-06-09 DIAGNOSIS — B182 Chronic viral hepatitis C: Secondary | ICD-10-CM

## 2015-06-09 DIAGNOSIS — K2100 Gastro-esophageal reflux disease with esophagitis, without bleeding: Secondary | ICD-10-CM

## 2015-06-09 NOTE — Progress Notes (Signed)
Primary Care Physician: Glo Herring., MD  Primary Gastroenterologist:  Garfield Cornea, MD   Chief Complaint  Patient presents with  . Follow-up    Hep C    HPI: Bobby York is a 60 y.o. male here for f/u HCV, genotype 1A, remotely treated with interferon regimen. Only completed 4 months of therapy. No evidence of cirrhosis. Patient started Harvoni around the second week of January.Recently completed 12 weeks of therapy and posttreatment HCV RNA was negative. He did very well with treatment with no side effects. He continues to feel well. Feels best to stop a long time. Has no GI complaints. Heartburn is still well controlled on omeprazole. He continues to take it prior to his evening meal because he was taken at the same time of the [place]. He can switch this to before breakfast as it may be more effective. Denies any bowel complaints. He questions whether or not his wife should be checked for hepatitis C whether or not he could be infected from her if she had it. Answered all questions. We previously had recommended she be checked but she did not have this done. She is not an established patient with [place] and we have advised her to see her PCP.  Patient has avoided alcohol for 4 months but he tells me he had one beer a day completed his Harvoni therapy. History of prior alcohol abuse. F0-F2 score on previous [facility] labs. F tube with some S3 on previous elastography (08/2014). Patient received 12 weeks of therapy based on guidelines for previous treatment failures to ribavirin/interferon WITHOUT cirrhosis. Patient did not complete ribavirin/interferon due to side effects (completed only 4 months).  H/o previous H.pylori. Need to check for eradication.   Current Outpatient Prescriptions  Medication Sig Dispense Refill  . ALPRAZolam (XANAX) 1 MG tablet Take 1 mg by mouth at bedtime. May take twice daily as needed for anxiety    . ARIPiprazole (ABILIFY) 5 MG tablet Take 5 mg by  mouth daily.   0  . mirtazapine (REMERON) 15 MG tablet Take 15 mg by mouth at bedtime.    Marland Kitchen omeprazole (PRILOSEC) 20 MG capsule Take one capsule 30 minutes before evening meal AT SAME TIME AS HARVONI. 90 capsule 1  . VIAGRA 100 MG tablet Take 100 mg by mouth as needed for erectile dysfunction.   10   No current facility-administered medications for this visit.    Allergies as of 06/09/2015  . (No Known Allergies)    ROS:  General: Negative for anorexia, weight loss, fever, chills, fatigue, weakness. ENT: Negative for hoarseness, difficulty swallowing , nasal congestion. CV: Negative for chest pain, angina, palpitations, dyspnea on exertion, peripheral edema.  Respiratory: Negative for dyspnea at rest, dyspnea on exertion, cough, sputum, wheezing.  GI: See history of present illness. GU:  Negative for dysuria, hematuria, urinary incontinence, urinary frequency, nocturnal urination.  Endo: Negative for unusual weight change.    Physical Examination:   BP 140/98 mmHg  Pulse 79  Ht 5\' 11"  (1.803 m)  Wt 193 lb 3.2 oz (87.635 kg)  BMI 26.96 kg/m2  General: Well-nourished, well-developed in no acute distress.  Eyes: No icterus. Mouth: Oropharyngeal mucosa moist and pink , no lesions erythema or exudate. Lungs: Clear to auscultation bilaterally.  Heart: Regular rate and rhythm, no murmurs rubs or gallops.  Abdomen: Bowel sounds are normal, nontender, nondistended, no hepatosplenomegaly or masses, no abdominal bruits or hernia , no rebound or guarding.   Extremities: No  lower extremity edema. No clubbing or deformities. Neuro: Alert and oriented x 4   Skin: Warm and dry, no jaundice.   Psych: Alert and cooperative, normal mood and affect.  Labs:  Lab Results  Component Value Date   WBC 5.8 06/02/2015   HGB 14.8 06/02/2015   HCT 44.6 06/02/2015   MCV 91.8 06/02/2015   PLT 318 06/02/2015   Lab Results  Component Value Date   ALT 16 06/02/2015   AST 19 06/02/2015   ALKPHOS  68 06/02/2015   BILITOT 0.6 06/02/2015   Lab Results  Component Value Date   CREATININE 1.06 06/02/2015   BUN 10 06/02/2015   NA 139 06/02/2015   K 4.4 06/02/2015   CL 104 06/02/2015   CO2 28 06/02/2015   HCV RNA not detected Imaging Studies: No results found.

## 2015-06-09 NOTE — Assessment & Plan Note (Signed)
Completed Harvoni one week ago for disease 12 week course). HCV RNA is not detected. Previous fibrosis testing via lab and ultrasound without evidence of cirrhosis. [facility] testing with F0 to F2. Based on guidelines he would not require Max screening from this point now. However given his prior alcohol abuse, would offer him ultrasound with elastography in 6 months to see if fibrosis scores have improved status post treatment. We have encouraged no alcohol use. Encouraged his wife to be checked for hep C. Discussed the patient could be reinfected with hepatitis C if he goes back to previous behaviors. Discussed likelihood of being approved for repeat treatment are low. Patient voiced understanding. We will have him come back in one year for follow-up of GERD and his liver.  Plan to check HCV RNA in six months.

## 2015-06-09 NOTE — Assessment & Plan Note (Signed)
Doing very well on omeprazole. Discussed that he could take omeprazole now before breakfast as opposed to before dinner time (he had switch timing due to the [facility]). Continue antireflux measures.  History of H. pylori. We need to determine eradication. He will need to be off PPI for at least 2 weeks.

## 2015-06-09 NOTE — Progress Notes (Signed)
cc'ed to pcp °

## 2015-06-09 NOTE — Progress Notes (Signed)
Patient needs to have abdominal ultrasound with elastography in six months. Dx: prior HCV treatment, etoh abuse.  He needs HCV RNA quantitative in six months.  He needs H pylori stool antigen or H pylori breath test done to make sure H. pylori was eradicated last year. He needs to be off PPI at least 2 weeks per indications.

## 2015-06-09 NOTE — Progress Notes (Signed)
Quick Note:  Patient aware. ______ 

## 2015-06-09 NOTE — Patient Instructions (Signed)
1. We will send a reminder when you are due for your next labs and/or ultrasound as discussed today. 2. Please let your wife know we recommend she be checked for hepatitis C. 3. See you in one year. Call with questions or concerns.

## 2015-06-10 NOTE — Progress Notes (Signed)
Do you want the hpylori now or in 6 months?

## 2015-06-30 NOTE — Progress Notes (Signed)
h pylori now off PPI 2 weeks.

## 2015-07-18 ENCOUNTER — Other Ambulatory Visit: Payer: Self-pay | Admitting: Gastroenterology

## 2015-07-18 ENCOUNTER — Other Ambulatory Visit: Payer: Self-pay

## 2015-07-18 DIAGNOSIS — A048 Other specified bacterial intestinal infections: Secondary | ICD-10-CM

## 2015-07-18 DIAGNOSIS — B182 Chronic viral hepatitis C: Secondary | ICD-10-CM

## 2015-07-18 NOTE — Progress Notes (Signed)
Lab order on file for HCV RNA, hpylori orders and letter mailed to the pt.

## 2015-08-21 DIAGNOSIS — E663 Overweight: Secondary | ICD-10-CM | POA: Diagnosis not present

## 2015-08-21 DIAGNOSIS — Z6825 Body mass index (BMI) 25.0-25.9, adult: Secondary | ICD-10-CM | POA: Diagnosis not present

## 2015-08-21 DIAGNOSIS — R030 Elevated blood-pressure reading, without diagnosis of hypertension: Secondary | ICD-10-CM | POA: Diagnosis not present

## 2015-08-21 DIAGNOSIS — E782 Mixed hyperlipidemia: Secondary | ICD-10-CM | POA: Diagnosis not present

## 2015-08-21 DIAGNOSIS — R7309 Other abnormal glucose: Secondary | ICD-10-CM | POA: Diagnosis not present

## 2015-08-21 DIAGNOSIS — Z1389 Encounter for screening for other disorder: Secondary | ICD-10-CM | POA: Diagnosis not present

## 2015-08-28 ENCOUNTER — Encounter: Payer: Self-pay | Admitting: Gastroenterology

## 2015-11-12 ENCOUNTER — Other Ambulatory Visit: Payer: Self-pay

## 2015-11-12 DIAGNOSIS — B182 Chronic viral hepatitis C: Secondary | ICD-10-CM

## 2016-01-30 DIAGNOSIS — S43102A Unspecified dislocation of left acromioclavicular joint, initial encounter: Secondary | ICD-10-CM | POA: Diagnosis not present

## 2016-01-30 DIAGNOSIS — G8918 Other acute postprocedural pain: Secondary | ICD-10-CM | POA: Diagnosis not present

## 2016-01-30 DIAGNOSIS — F418 Other specified anxiety disorders: Secondary | ICD-10-CM | POA: Diagnosis not present

## 2016-04-14 ENCOUNTER — Emergency Department (HOSPITAL_COMMUNITY): Payer: BLUE CROSS/BLUE SHIELD

## 2016-04-14 ENCOUNTER — Encounter (HOSPITAL_COMMUNITY): Payer: Self-pay | Admitting: Emergency Medicine

## 2016-04-14 ENCOUNTER — Inpatient Hospital Stay (HOSPITAL_COMMUNITY)
Admission: EM | Admit: 2016-04-14 | Discharge: 2016-04-18 | DRG: 200 | Disposition: A | Discharge status: Home / Self Care | Payer: BLUE CROSS/BLUE SHIELD | Attending: Surgery | Admitting: Surgery

## 2016-04-14 DIAGNOSIS — F1010 Alcohol abuse, uncomplicated: Secondary | ICD-10-CM | POA: Diagnosis not present

## 2016-04-14 DIAGNOSIS — S0990XA Unspecified injury of head, initial encounter: Secondary | ICD-10-CM | POA: Diagnosis not present

## 2016-04-14 DIAGNOSIS — M25562 Pain in left knee: Secondary | ICD-10-CM | POA: Diagnosis not present

## 2016-04-14 DIAGNOSIS — S3991XA Unspecified injury of abdomen, initial encounter: Secondary | ICD-10-CM | POA: Diagnosis not present

## 2016-04-14 DIAGNOSIS — F10920 Alcohol use, unspecified with intoxication, uncomplicated: Secondary | ICD-10-CM

## 2016-04-14 DIAGNOSIS — B182 Chronic viral hepatitis C: Secondary | ICD-10-CM | POA: Diagnosis present

## 2016-04-14 DIAGNOSIS — J9811 Atelectasis: Secondary | ICD-10-CM | POA: Diagnosis present

## 2016-04-14 DIAGNOSIS — S270XXA Traumatic pneumothorax, initial encounter: Secondary | ICD-10-CM | POA: Diagnosis present

## 2016-04-14 DIAGNOSIS — B1920 Unspecified viral hepatitis C without hepatic coma: Secondary | ICD-10-CM | POA: Diagnosis not present

## 2016-04-14 DIAGNOSIS — T797XXA Traumatic subcutaneous emphysema, initial encounter: Secondary | ICD-10-CM | POA: Diagnosis present

## 2016-04-14 DIAGNOSIS — S8992XA Unspecified injury of left lower leg, initial encounter: Secondary | ICD-10-CM | POA: Diagnosis not present

## 2016-04-14 DIAGNOSIS — F17210 Nicotine dependence, cigarettes, uncomplicated: Secondary | ICD-10-CM | POA: Diagnosis not present

## 2016-04-14 DIAGNOSIS — F319 Bipolar disorder, unspecified: Secondary | ICD-10-CM | POA: Diagnosis present

## 2016-04-14 DIAGNOSIS — S199XXA Unspecified injury of neck, initial encounter: Secondary | ICD-10-CM | POA: Diagnosis not present

## 2016-04-14 DIAGNOSIS — Z79899 Other long term (current) drug therapy: Secondary | ICD-10-CM | POA: Diagnosis not present

## 2016-04-14 DIAGNOSIS — Z4682 Encounter for fitting and adjustment of non-vascular catheter: Secondary | ICD-10-CM

## 2016-04-14 DIAGNOSIS — S2241XA Multiple fractures of ribs, right side, initial encounter for closed fracture: Secondary | ICD-10-CM | POA: Diagnosis not present

## 2016-04-14 DIAGNOSIS — Z86010 Personal history of colonic polyps: Secondary | ICD-10-CM | POA: Diagnosis not present

## 2016-04-14 DIAGNOSIS — J939 Pneumothorax, unspecified: Secondary | ICD-10-CM | POA: Diagnosis not present

## 2016-04-14 DIAGNOSIS — R918 Other nonspecific abnormal finding of lung field: Secondary | ICD-10-CM | POA: Diagnosis not present

## 2016-04-14 LAB — CBC
HEMATOCRIT: 43.2 % (ref 39.0–52.0)
Hemoglobin: 14.8 g/dL (ref 13.0–17.0)
MCH: 32.8 pg (ref 26.0–34.0)
MCHC: 34.3 g/dL (ref 30.0–36.0)
MCV: 95.8 fL (ref 78.0–100.0)
Platelets: 268 10*3/uL (ref 150–400)
RBC: 4.51 MIL/uL (ref 4.22–5.81)
RDW: 12.5 % (ref 11.5–15.5)
WBC: 12.6 10*3/uL — AB (ref 4.0–10.5)

## 2016-04-14 LAB — COMPREHENSIVE METABOLIC PANEL
ALT: 55 U/L (ref 17–63)
ANION GAP: 12 (ref 5–15)
AST: 138 U/L — AB (ref 15–41)
Albumin: 4.3 g/dL (ref 3.5–5.0)
Alkaline Phosphatase: 52 U/L (ref 38–126)
BUN: 13 mg/dL (ref 6–20)
CHLORIDE: 102 mmol/L (ref 101–111)
CO2: 24 mmol/L (ref 22–32)
Calcium: 8.9 mg/dL (ref 8.9–10.3)
Creatinine, Ser: 1.29 mg/dL — ABNORMAL HIGH (ref 0.61–1.24)
GFR calc Af Amer: >60 mL/min (ref >60)
GFR, EST NON AFRICAN AMERICAN: 58 mL/min — AB (ref >60)
Glucose, Bld: 101 mg/dL — ABNORMAL HIGH (ref 65–99)
POTASSIUM: 3.8 mmol/L (ref 3.5–5.1)
Sodium: 138 mmol/L (ref 135–145)
TOTAL PROTEIN: 7.8 g/dL (ref 6.5–8.1)
Total Bilirubin: 0.8 mg/dL (ref 0.3–1.2)

## 2016-04-14 LAB — I-STAT CG4 LACTIC ACID, ED: LACTIC ACID, VENOUS: 3.82 mmol/L — AB (ref 0.5–1.9)

## 2016-04-14 LAB — ETHANOL: Alcohol, Ethyl (B): 176 mg/dL — ABNORMAL HIGH (ref <5)

## 2016-04-14 LAB — SAMPLE TO BLOOD BANK

## 2016-04-14 LAB — PROTIME-INR
INR: 0.94
PROTHROMBIN TIME: 12.6 s (ref 11.4–15.2)

## 2016-04-14 MED ORDER — SODIUM CHLORIDE 0.9 % IV BOLUS (SEPSIS)
125.0000 mL | Freq: Once | INTRAVENOUS | Status: AC
  Administered 2016-04-14: 23:00:00 via INTRAVENOUS

## 2016-04-14 MED ORDER — LIDOCAINE HCL (PF) 2 % IJ SOLN
INTRAMUSCULAR | Status: AC
  Filled 2016-04-14: qty 10

## 2016-04-14 MED ORDER — IOPAMIDOL (ISOVUE-300) INJECTION 61%
100.0000 mL | Freq: Once | INTRAVENOUS | Status: AC | PRN
  Administered 2016-04-14: 100 mL via INTRAVENOUS

## 2016-04-14 MED ORDER — LIDOCAINE HCL (PF) 2 % IJ SOLN
10.0000 mL | Freq: Once | INTRAMUSCULAR | Status: DC
  Filled 2016-04-14: qty 10

## 2016-04-14 MED ORDER — HYDROMORPHONE HCL 1 MG/ML IJ SOLN
1.0000 mg | Freq: Once | INTRAMUSCULAR | Status: AC
  Administered 2016-04-14: 1 mg via INTRAVENOUS
  Filled 2016-04-14: qty 1

## 2016-04-14 MED ORDER — SODIUM CHLORIDE 0.9 % IV BOLUS (SEPSIS)
500.0000 mL | Freq: Once | INTRAVENOUS | Status: AC
  Administered 2016-04-14: 500 mL via INTRAVENOUS

## 2016-04-14 MED ORDER — ONDANSETRON HCL 4 MG/2ML IJ SOLN
4.0000 mg | Freq: Once | INTRAMUSCULAR | Status: AC
  Administered 2016-04-14: 4 mg via INTRAVENOUS
  Filled 2016-04-14: qty 2

## 2016-04-14 MED ORDER — HYDROMORPHONE HCL 1 MG/ML IJ SOLN
1.0000 mg | Freq: Once | INTRAMUSCULAR | Status: AC
  Administered 2016-04-15: 1 mg via INTRAVENOUS
  Filled 2016-04-14: qty 1

## 2016-04-14 MED ORDER — POVIDONE-IODINE 10 % EX SOLN
CUTANEOUS | Status: AC
  Filled 2016-04-14: qty 118

## 2016-04-14 NOTE — ED Notes (Signed)
ED Provider at bedside. 

## 2016-04-14 NOTE — ED Notes (Signed)
Pt oxygen 87%. Pt placed on 2 L with no increase in oxygen. Pt oxygen increased to 4 L. Oxygen now 95%.

## 2016-04-14 NOTE — ED Notes (Signed)
Chest tube repositioned by EDP

## 2016-04-14 NOTE — ED Notes (Signed)
Pt placed on 2L 02 [place] for 88% 02 sats.
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
## 2016-04-14 NOTE — ED Notes (Signed)
Chest tube inserted by Dr Eulis Foster. Tolerated well
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
## 2016-04-14 NOTE — ED Notes (Signed)
vaseline gauze applied to chest tube site, draining bloody fluid.

## 2016-04-15 ENCOUNTER — Inpatient Hospital Stay (HOSPITAL_COMMUNITY): Payer: BLUE CROSS/BLUE SHIELD

## 2016-04-15 LAB — CBC
HCT: 38.9 % — ABNORMAL LOW (ref 39.0–52.0)
Hemoglobin: 13.1 g/dL (ref 13.0–17.0)
MCH: 32.3 pg (ref 26.0–34.0)
MCHC: 33.7 g/dL (ref 30.0–36.0)
MCV: 95.8 fL (ref 78.0–100.0)
PLATELETS: 217 10*3/uL (ref 150–400)
RBC: 4.06 MIL/uL — ABNORMAL LOW (ref 4.22–5.81)
RDW: 12.6 % (ref 11.5–15.5)
WBC: 10.8 10*3/uL — AB (ref 4.0–10.5)

## 2016-04-15 LAB — BASIC METABOLIC PANEL
ANION GAP: 13 (ref 5–15)
BUN: 9 mg/dL (ref 6–20)
CALCIUM: 8.5 mg/dL — AB (ref 8.9–10.3)
CO2: 21 mmol/L — ABNORMAL LOW (ref 22–32)
Chloride: 105 mmol/L (ref 101–111)
Creatinine, Ser: 1.02 mg/dL (ref 0.61–1.24)
GFR calc Af Amer: >60 mL/min (ref >60)
GLUCOSE: 96 mg/dL (ref 65–99)
Potassium: 4.1 mmol/L (ref 3.5–5.1)
SODIUM: 139 mmol/L (ref 135–145)

## 2016-04-15 LAB — URINALYSIS, ROUTINE W REFLEX MICROSCOPIC
BILIRUBIN URINE: NEGATIVE
Glucose, UA: NEGATIVE mg/dL
KETONES UR: NEGATIVE mg/dL
LEUKOCYTES UA: NEGATIVE
Nitrite: NEGATIVE
PROTEIN: NEGATIVE mg/dL
Specific Gravity, Urine: 1.025 (ref 1.005–1.030)
pH: 5.5 (ref 5.0–8.0)

## 2016-04-15 LAB — HIV ANTIBODY (ROUTINE TESTING W REFLEX): HIV Screen 4th Generation wRfx: NONREACTIVE

## 2016-04-15 LAB — URINALYSIS, MICROSCOPIC (REFLEX)

## 2016-04-15 MED ORDER — OXYCODONE HCL 5 MG PO TABS
5.0000 mg | ORAL_TABLET | ORAL | Status: DC | PRN
  Administered 2016-04-15 – 2016-04-18 (×13): 10 mg via ORAL
  Filled 2016-04-15 (×13): qty 2

## 2016-04-15 MED ORDER — PNEUMOCOCCAL VAC POLYVALENT 25 MCG/0.5ML IJ INJ
0.5000 mL | INJECTION | INTRAMUSCULAR | Status: AC
  Administered 2016-04-18: 0.5 mL via INTRAMUSCULAR
  Filled 2016-04-15: qty 0.5

## 2016-04-15 MED ORDER — THIAMINE HCL 100 MG/ML IJ SOLN
100.0000 mg | Freq: Every day | INTRAMUSCULAR | Status: DC
  Filled 2016-04-15: qty 2

## 2016-04-15 MED ORDER — FOLIC ACID 1 MG PO TABS
1.0000 mg | ORAL_TABLET | Freq: Every day | ORAL | Status: DC
  Administered 2016-04-15 – 2016-04-18 (×4): 1 mg via ORAL
  Filled 2016-04-15 (×4): qty 1

## 2016-04-15 MED ORDER — OXYCODONE HCL 5 MG PO TABS
10.0000 mg | ORAL_TABLET | ORAL | Status: DC | PRN
  Administered 2016-04-15 (×2): 10 mg via ORAL
  Filled 2016-04-15 (×2): qty 2

## 2016-04-15 MED ORDER — ADULT MULTIVITAMIN W/MINERALS CH
1.0000 | ORAL_TABLET | Freq: Every day | ORAL | Status: DC
Start: 2016-04-15 — End: 2016-04-18
  Administered 2016-04-15 – 2016-04-18 (×4): 1 via ORAL
  Filled 2016-04-15 (×4): qty 1

## 2016-04-15 MED ORDER — LORAZEPAM 1 MG PO TABS
1.0000 mg | ORAL_TABLET | Freq: Four times a day (QID) | ORAL | Status: DC | PRN
  Filled 2016-04-15: qty 1

## 2016-04-15 MED ORDER — ALPRAZOLAM 0.5 MG PO TABS
1.0000 mg | ORAL_TABLET | Freq: Two times a day (BID) | ORAL | Status: DC | PRN
  Administered 2016-04-16 – 2016-04-17 (×3): 1 mg via ORAL
  Filled 2016-04-15 (×3): qty 2

## 2016-04-15 MED ORDER — LORAZEPAM 2 MG/ML IJ SOLN: 1.0000 mg | Freq: Four times a day (QID) | INTRAMUSCULAR | Status: DC | PRN

## 2016-04-15 MED ORDER — HYDROMORPHONE HCL 2 MG/ML IJ SOLN: 1.0000 mg | INTRAMUSCULAR | Status: DC | PRN

## 2016-04-15 MED ORDER — ENOXAPARIN SODIUM 40 MG/0.4ML ~~LOC~~ SOLN
40.0000 mg | SUBCUTANEOUS | Status: DC
  Administered 2016-04-15 – 2016-04-18 (×4): 40 mg via SUBCUTANEOUS
  Filled 2016-04-15 (×4): qty 0.4

## 2016-04-15 MED ORDER — OXYCODONE HCL 5 MG PO TABS: 5.0000 mg | ORAL_TABLET | ORAL | Status: DC | PRN

## 2016-04-15 MED ORDER — SODIUM CHLORIDE 0.9 % IV SOLN
INTRAVENOUS | Status: DC
  Administered 2016-04-15 (×2): via INTRAVENOUS

## 2016-04-15 MED ORDER — INFLUENZA VAC SPLIT QUAD 0.5 ML IM SUSY
0.5000 mL | PREFILLED_SYRINGE | INTRAMUSCULAR | Status: AC
  Administered 2016-04-18: 0.5 mL via INTRAMUSCULAR
  Filled 2016-04-15: qty 0.5

## 2016-04-15 MED ORDER — MIRTAZAPINE 15 MG PO TABS
15.0000 mg | ORAL_TABLET | Freq: Every day | ORAL | Status: DC
  Administered 2016-04-15 – 2016-04-17 (×4): 15 mg via ORAL
  Filled 2016-04-15 (×4): qty 1

## 2016-04-15 MED ORDER — VITAMIN B-1 100 MG PO TABS
100.0000 mg | ORAL_TABLET | Freq: Every day | ORAL | Status: DC
  Administered 2016-04-15 – 2016-04-18 (×4): 100 mg via ORAL
  Filled 2016-04-15 (×4): qty 1

## 2016-04-15 MED ORDER — ONDANSETRON HCL 4 MG PO TABS: 4.0000 mg | ORAL_TABLET | Freq: Four times a day (QID) | ORAL | Status: DC | PRN

## 2016-04-15 MED ORDER — ONDANSETRON HCL 4 MG/2ML IJ SOLN: 4.0000 mg | Freq: Four times a day (QID) | INTRAMUSCULAR | Status: DC | PRN

## 2016-04-15 NOTE — ED Notes (Signed)
Attempted to call report x 4 times. Rollene Fare put me on hold one all other times no answer.
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
## 2016-04-16 ENCOUNTER — Inpatient Hospital Stay (HOSPITAL_COMMUNITY): Payer: BLUE CROSS/BLUE SHIELD
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
## 2016-04-17 ENCOUNTER — Inpatient Hospital Stay (HOSPITAL_COMMUNITY): Payer: BLUE CROSS/BLUE SHIELD
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
## 2016-04-17 NOTE — Progress Notes (Signed)
Trauma Service Note  Subjective: Patient doing well.  CXR today.  Objective: Vital signs in last 24 hours: Temp:  [98.1 F (36.7 C)-99.5 F (37.5 C)] 98.1 F (36.7 C) (02/24 0628) Pulse Rate:  [82-93] 89 (02/24 0628) Resp:  [17-20] 17 (02/24 0628) BP: (135-155)/(81-96) 135/90 (02/24 0628) SpO2:  [91 %-96 %] 91 % (02/24 0628) Last BM Date: 04/14/16  Intake/Output from previous day: 02/23 0701 - 02/24 0700 In: 660 [P.O.:660] Out: 800 [Urine:700; Chest Tube:100] Intake/Output this shift: No intake/output data recorded.  General: No distress.  Lungs: Clear. Still has some subcutaneous air on the right side.  CXR without PTX.  100cc output last 245 hours.  Abd: Soft, benign  Extremities: No changes  Neuro: Intact  Lab Results: CBC   Recent Labs  04/14/16 1850 04/15/16 0222  WBC 12.6* 10.8*  HGB 14.8 13.1  HCT 43.2 38.9*  PLT 268 217   BMET  Recent Labs  04/14/16 1850 04/15/16 0222  NA 138 139  K 3.8 4.1  CL 102 105  CO2 24 21*  GLUCOSE 101* 96  BUN 13 9  CREATININE 1.29* 1.02  CALCIUM 8.9 8.5*   PT/INR  Recent Labs  04/14/16 1850  LABPROT 12.6  INR 0.94   ABG No results for input(s): PHART, HCO3 in the last 72 hours.  Invalid input(s): PCO2, PO2  Studies/Results: Dg Chest Port 1 View  Result Date: 04/17/2016 CLINICAL DATA:  Follow-up right pneumothorax following trauma. EXAM: PORTABLE CHEST 1 VIEW COMPARISON:  04/17/2011 and prior studies FINDINGS: A right thoracostomy tube is again identified without definite pneumothorax. Right subcutaneous emphysema is again noted. Fractures of the right fourth through seventh ribs are identified on this study. Mild right basilar atelectasis is unchanged. No other significant abnormalities are noted. IMPRESSION: Right thoracostomy tube with right subcutaneous emphysema and mild right basilar atelectasis again noted. No definite pneumothorax. Right rib fractures again identified. Electronically Signed   By:  Margarette Canada M.D.   On: 04/17/2016 09:30   Dg Chest Port 1 View  Result Date: 04/16/2016 CLINICAL DATA:  Pneumothorax on the right.  Chest tube EXAM: PORTABLE CHEST 1 VIEW COMPARISON:  Yesterday FINDINGS: Shortened appearance of the right-sided chest tube, but side-port still overlapping the hemithorax. No visible pneumothorax. Chest wall emphysema stable. Stable cardiomegaly. Stable presumed atelectasis. IMPRESSION: 1. Shorter right-sided chest tube, but still in unremarkable position. No visible pneumothorax. Stable chest wall emphysema. 2. Patchy atelectasis. Electronically Signed   By: Monte Fantasia M.D.   On: 04/16/2016 07:23    Anti-infectives: Anti-infectives    None      Assessment/Plan: s/p  Check CXR at 1400.  If okay patient can go home later today.  LOS: 3 days   Kathryne Eriksson. Dahlia Bailiff, MD, FACS [phone] Trauma Surgeon 04/17/2016

## 2016-04-17 NOTE — Progress Notes (Signed)
Right chest tube was removed/discontineud by PA this morning. Dressing dry and intact. Pt encouraged to ambulate to the hall. I.S at 1250.

## 2016-04-18 MED ORDER — OXYCODONE HCL 5 MG PO TABS: 5.0000 mg | ORAL_TABLET | ORAL | 0 refills | Status: DC | PRN
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
## 2016-04-18 NOTE — Discharge Instructions (Signed)
Call our office to schedule an appointment to be seen in 2-3 weeks. Remove your dressing in 2 days (Tuesday). Keep it clean and dry and intact until then. Call us or come to the ED if you experience worsening chest pain, difficulty breathing, fever, chills, cough or any other concerning symptoms.   Rib Fracture A rib fracture is a break or crack in one of the bones of the ribs. The ribs are a group of long, curved bones that wrap around your chest and attach to your spine. They protect your lungs and other organs in the chest cavity. A broken or cracked rib is often painful, but most do not cause other problems. Most rib fractures heal on their own over time. However, rib fractures can be more serious if multiple ribs are broken or if broken ribs move out of place and push against other structures. What are the causes?  A direct blow to the chest. For example, this could happen during contact sports, a car accident, or a fall against a hard object.  Repetitive movements with high force, such as pitching a baseball or having severe coughing spells. What are the signs or symptoms?  Pain when you breathe in or cough.  Pain when someone presses on the injured area. How is this diagnosed? Your caregiver will perform a physical exam. Various imaging tests may be ordered to confirm the diagnosis and to look for related injuries. These tests may include a chest X-ray, computed tomography (CT), magnetic resonance imaging (MRI), or a bone scan. How is this treated? Rib fractures usually heal on their own in 1-3 months. The longer healing period is often associated with a continued cough or other aggravating activities. During the healing period, pain control is very important. Medication is usually given to control pain. Hospitalization or surgery may be needed for more severe injuries, such as those in which multiple ribs are broken or the ribs have moved out of place. Follow these instructions at  home:  Avoid strenuous activity and any activities or movements that cause pain. Be careful during activities and avoid bumping the injured rib.  Gradually increase activity as directed by your caregiver.  Only take over-the-counter or prescription medications as directed by your caregiver. Do not take other medications without asking your caregiver first.  Apply ice to the injured area for the first 1-2 days after you have been treated or as directed by your caregiver. Applying ice helps to reduce inflammation and pain.  Put ice in a plastic bag.  Place a towel between your skin and the bag.  Leave the ice on for 15-20 minutes at a time, every 2 hours while you are awake.  Perform deep breathing as directed by your caregiver. This will help prevent pneumonia, which is a common complication of a broken rib. Your caregiver may instruct you to:  Take deep breaths several times a day.  Try to cough several times a day, holding a pillow against the injured area.  Use a device called an incentive spirometer to practice deep breathing several times a day.  Drink enough fluids to keep your urine clear or pale yellow. This will help you avoid constipation.  Do not wear a rib belt or binder. These restrict breathing, which can lead to pneumonia. Get help right away if:  You have a fever.  You have difficulty breathing or shortness of breath.  You develop a continual cough, or you cough up thick or bloody sputum.  You feel  sick to your stomach (nausea), throw up (vomit), or have abdominal pain.  You have worsening pain not controlled with medications. This information is not intended to replace advice given to you by your health care provider. Make sure you discuss any questions you have with your health care provider. Document Released: 02/08/2005 Document Revised: 07/17/2015 Document Reviewed: 04/12/2012 Elsevier Interactive Patient Education  2017 Elsevier  Inc.     Pneumothorax Introduction A pneumothorax, commonly called a collapsed lung, is a condition in which air leaks from a lung and builds up in the space between the lung and the chest wall (pleural space). The air in a pneumothorax is trapped outside the lung and takes up space, preventing the lung from fully expanding. This is a condition that usually occurs suddenly. The buildup of air may be small or large. A small pneumothorax may go away on its own. When a pneumothorax is larger, it will often require medical treatment and hospitalization. What are the causes? A pneumothorax can sometimes happen quickly with no apparent cause. People with underlying lung problems, particularly COPD or emphysema, are at higher risk of pneumothorax. However, pneumothorax can happen quickly even in people with no prior known lung problems. Trauma, surgery, medical procedures, or injury to the chest wall can also cause a pneumothorax. What are the signs or symptoms? Sometimes a pneumothorax will have no symptoms. When symptoms are present, they can include:  Chest pain.  Shortness of breath.  Increased rate of breathing.  Bluish color to your lips or skin (cyanosis). How is this diagnosed? Pneumothorax is usually diagnosed by a chest X-ray or chest CT scan. Your health care provider will also take a medical history and perform a physical exam to determine why you may have a pneumothorax. How is this treated? A small pneumothorax may go away on its own without treatment. Extra oxygen can sometimes help a small pneumothorax go away more quickly. For a larger pneumothorax or a pneumothorax that is causing symptoms, a procedure is usually needed to drain the air.In some cases, the health care provider may drain the air using a needle. In other cases, a chest tube may be inserted into the pleural space. A chest tube is a small tube placed between the ribs and into the pleural space. This removes the extra  air and allows the lung to expand back to its normal size. A large pneumothorax will usually require a hospital stay. If there is ongoing air leakage into the pleural space, then the chest tube may need to remain in place for several days until the air leak has healed. In some cases, surgery may be needed. Follow these instructions at home:  Only take over-the-counter or prescription medicines as directed by your health care provider.  If a cough or pain makes it difficult for you to sleep at night, try sleeping in a semi-upright position in a recliner or by using 2 or 3 pillows.  Rest and limit activity as directed by your health care provider.  If you had a chest tube and it was removed, ask your health care provider when it is okay to remove the dressing. Until your health care provider says you can remove the dressing, do not allow it to get wet.  Do not smoke. Smoking is a risk factor for pneumothorax.  Do not fly in an airplane or scuba dive until your health care provider says it is okay.  Follow up with your health care provider as directed. Get help  right away if:  You have increasing chest pain or shortness of breath.  You have a cough that is not controlled with suppressants.  You begin coughing up blood.  You have pain that is getting worse or is not controlled with medicines.  You cough up thick, discolored mucus (sputum) that is yellow to green in color.  You have redness, increasing pain, or discharge at the site where a chest tube had been in place (if your pneumothorax was treated with a chest tube).  The site where your chest tube was located opens up.  You feel air coming out of the site where the chest tube was placed.  You have a fever or persistent symptoms for more than 2-3 days.  You have a fever and your symptoms suddenly get worse. This information is not intended to replace advice given to you by your health care provider. Make sure you discuss any  questions you have with your health care provider. Document Released: 02/08/2005 Document Revised: 07/17/2015 Document Reviewed: 07/04/2013  2017 Elsevier

## 2016-04-18 NOTE — Progress Notes (Signed)
Discharged patient to home with instructions and prescription.
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
## 2016-04-27 ENCOUNTER — Encounter: Payer: Self-pay | Admitting: Internal Medicine

## 2016-06-18 ENCOUNTER — Encounter (HOSPITAL_COMMUNITY): Payer: Self-pay | Admitting: *Deleted

## 2016-06-18 ENCOUNTER — Emergency Department (HOSPITAL_COMMUNITY)
Admission: EM | Admit: 2016-06-18 | Discharge: 2016-06-18 | Disposition: A | Discharge status: Home / Self Care | Payer: BLUE CROSS/BLUE SHIELD | Attending: Emergency Medicine | Admitting: Emergency Medicine

## 2016-06-18 DIAGNOSIS — G8929 Other chronic pain: Secondary | ICD-10-CM | POA: Diagnosis not present

## 2016-06-18 DIAGNOSIS — F17210 Nicotine dependence, cigarettes, uncomplicated: Secondary | ICD-10-CM | POA: Diagnosis not present

## 2016-06-18 DIAGNOSIS — M25562 Pain in left knee: Secondary | ICD-10-CM | POA: Diagnosis not present

## 2016-06-18 MED ORDER — DICLOFENAC SODIUM 1 % TD GEL: 4.0000 g | Freq: Four times a day (QID) | TRANSDERMAL | 0 refills | Status: DC

## 2016-06-18 MED ORDER — HYDROCODONE-ACETAMINOPHEN 5-325 MG PO TABS: ORAL_TABLET | ORAL | 0 refills | Status: DC

## 2016-06-18 NOTE — Discharge Instructions (Signed)
Wear the knee brace as needed when walking or standing.  Follow-up with your primary provider regarding the MRI appt.  Call Dr. Ruthe Mannan office to arrange a follow-up appt.
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
## 2016-06-18 NOTE — ED Notes (Signed)
ED Provider at bedside. 

## 2016-06-29 ENCOUNTER — Encounter: Payer: Self-pay | Admitting: Orthopaedic Surgery

## 2016-06-29 ENCOUNTER — Ambulatory Visit (INDEPENDENT_AMBULATORY_CARE_PROVIDER_SITE_OTHER): Payer: BLUE CROSS/BLUE SHIELD | Admitting: Orthopaedic Surgery

## 2016-06-29 VITALS — BP 154/98 | HR 86 | Temp 97.2°F | Resp 18 | Ht 72.0 in | Wt 170.0 lb

## 2016-06-29 DIAGNOSIS — M25562 Pain in left knee: Secondary | ICD-10-CM | POA: Diagnosis not present

## 2016-06-29 DIAGNOSIS — G8929 Other chronic pain: Secondary | ICD-10-CM | POA: Diagnosis not present

## 2016-06-29 MED ORDER — HYDROCODONE-ACETAMINOPHEN 5-325 MG PO TABS: ORAL_TABLET | ORAL | 0 refills | Status: DC
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
## 2016-07-02 ENCOUNTER — Ambulatory Visit (HOSPITAL_COMMUNITY)
Admission: RE | Admit: 2016-07-02 | Discharge: 2016-07-02 | Disposition: A | Discharge status: Home / Self Care | Payer: BLUE CROSS/BLUE SHIELD | Source: Ambulatory Visit | Attending: Orthopaedic Surgery | Admitting: Orthopaedic Surgery

## 2016-07-02 DIAGNOSIS — M25562 Pain in left knee: Secondary | ICD-10-CM | POA: Diagnosis not present

## 2016-07-02 DIAGNOSIS — G8929 Other chronic pain: Secondary | ICD-10-CM | POA: Insufficient documentation

## 2016-07-02 DIAGNOSIS — S83412A Sprain of medial collateral ligament of left knee, initial encounter: Secondary | ICD-10-CM | POA: Insufficient documentation

## 2016-07-02 DIAGNOSIS — X58XXXA Exposure to other specified factors, initial encounter: Secondary | ICD-10-CM | POA: Diagnosis not present

## 2016-07-06 ENCOUNTER — Encounter: Payer: Self-pay | Admitting: Orthopaedic Surgery

## 2016-07-06 ENCOUNTER — Ambulatory Visit (INDEPENDENT_AMBULATORY_CARE_PROVIDER_SITE_OTHER): Payer: BLUE CROSS/BLUE SHIELD | Admitting: Orthopaedic Surgery

## 2016-07-06 VITALS — BP 141/102 | HR 101 | Temp 97.7°F | Ht 71.0 in | Wt 170.0 lb

## 2016-07-06 DIAGNOSIS — G8929 Other chronic pain: Secondary | ICD-10-CM

## 2016-07-06 DIAGNOSIS — S86912D Strain of unspecified muscle(s) and tendon(s) at lower leg level, left leg, subsequent encounter: Secondary | ICD-10-CM

## 2016-07-06 DIAGNOSIS — M25562 Pain in left knee: Secondary | ICD-10-CM

## 2016-07-06 MED ORDER — HYDROCODONE-ACETAMINOPHEN 5-325 MG PO TABS: ORAL_TABLET | ORAL | 0 refills | Status: DC

## 2016-07-06 NOTE — Progress Notes (Signed)
Patient Bobby York, male DOB:10/14/1955, 61 y.o. QQI:[id number]  Chief Complaint  Patient presents with  . Results    MRI Left Knee    HPI  Bobby York is a 62 y.o. male who has left knee pain with more pain medially.  He had MRI of the knee which showed: IMPRESSION: Abnormal appearance of both the ACL and PCL consistent with sprain and partial tearing of both ligaments. Intact fibers are seen in both ligaments.  High grade 2 sprain of the MCL which is partially torn. Intact fibers are identified.  Mild patellofemoral and medial compartment degenerative change.  I will give knee sleeve.  Use explained.  I went over the findings with him. HPI  Body mass index is 23.71 kg/m.  ROS  Review of Systems  HENT: Negative for congestion.   Respiratory: Negative for cough and shortness of breath.   Cardiovascular: Negative for chest pain and leg swelling.  Endocrine: Negative for cold intolerance.  Musculoskeletal: Positive for arthralgias, gait problem and joint swelling.  Allergic/Immunologic: Negative for environmental allergies.    Past Medical History:  Diagnosis Date  . Bipolar 1 disorder ([place])    diagnosed 2004  . Hepatitis    Hep C    Past Surgical History:  Procedure Laterality Date  . BACK SURGERY     two-lumbar disc x2  . COLONOSCOPY WITH PROPOFOL N/A 07/25/2014   HER:DEYCXK rectal polyp otherwise normal . hyperplastic polyp. TCS in 07/2024  . ESOPHAGEAL DILATION N/A 07/25/2014   Procedure: ESOPHAGEAL DILATION;  Surgeon: Daneil Dolin, MD;  Location: [facility];  Service: Endoscopy;  Laterality: N/A;  Malony dilators # 56,  . ESOPHAGOGASTRODUODENOSCOPY (EGD) WITH PROPOFOL N/A 07/25/2014   GYJ:EHUD erosive reflux/s/p dilator/HH. +hpylor     Family History  Problem Relation Age of Onset  . Heart disease Mother   . Diabetes Mother   . Prostate cancer Father   . Liver disease Neg Hx   . Colon cancer Neg Hx     Social History Social History   Substance Use Topics  . Smoking status: Current Every Day Smoker    Packs/day: 0.02    Years: 40.00    Types: Cigarettes    Start date: 05/28/1970  . Smokeless tobacco: Never Used     Comment: 3 cigarettes daily  . Alcohol use 0.0 oz/week     Comment: Occ    No Known Allergies  Current Outpatient Prescriptions  Medication Sig Dispense Refill  . ALPRAZolam (XANAX) 1 MG tablet Take 1 mg by mouth at bedtime. May take twice daily as needed for anxiety    . diclofenac sodium (VOLTAREN) 1 % GEL Apply 4 g topically 4 (four) times daily. 100 g 0  . HYDROcodone-acetaminophen (NORCO/VICODIN) 5-325 MG tablet One tablet by mouth every six hours as needed for pain.  Seven day limit 28 tablet 0  . mirtazapine (REMERON) 15 MG tablet Take 15 mg by mouth at bedtime.    Marland Kitchen oxyCODONE (OXY IR/ROXICODONE) 5 MG immediate release tablet Take 1 tablet (5 mg total) by mouth every 4 (four) hours as needed for moderate pain or severe pain. 30 tablet 0  . VIAGRA 100 MG tablet Take 100 mg by mouth as needed for erectile dysfunction.   10   No current facility-administered medications for this visit.      Physical Exam  Blood pressure (!) 141/102, pulse (!) 101, temperature 97.7 F (36.5 C), height 5\' 11"  (1.803 m), weight 170 lb (77.1 kg).  Constitutional: overall normal hygiene, normal nutrition, well developed, normal grooming, normal body habitus. Assistive device:none  Musculoskeletal: gait and station Limp left, muscle tone and strength are normal, no tremors or atrophy is present.  .  Neurological: coordination overall normal.  Deep tendon reflex/nerve stretch intact.  Sensation normal.  Cranial nerves II-XII intact.   Skin:   Normal overall no scars, lesions, ulcers or rashes. No psoriasis.  Psychiatric: Alert and oriented x 3.  Recent memory intact, remote memory unclear.  Normal mood and affect. Well groomed.  Good eye contact.  Cardiovascular: overall no swelling, no varicosities, no edema  bilaterally, normal temperatures of the legs and arms, no clubbing, cyanosis and good capillary refill.  Lymphatic: palpation is normal.  Left knee has effusion 1+ with pain over MCL and slight laxity medially.  Knee stable.  NV intact. ROM 0 to 110.  Limp left.  The patient has been educated about the nature of the problem(s) and counseled on treatment options.  The patient appeared to understand what I have discussed and is in agreement with it.  Encounter Diagnoses  Name Primary?  . Chronic pain of left knee Yes  . Strain of left knee, subsequent encounter     PLAN Call if any problems.  Precautions discussed.  Continue current medications.   Return to clinic 1 month   Knee sleeve with supports given.  I have reviewed the [facility] web site prior to prescribing narcotic medicine for this patient.  Electronically Signed Sanjuana Kava, MD 5/15/201810:14 AM

## 2016-08-03 ENCOUNTER — Ambulatory Visit (INDEPENDENT_AMBULATORY_CARE_PROVIDER_SITE_OTHER): Payer: BLUE CROSS/BLUE SHIELD | Admitting: Orthopaedic Surgery

## 2016-08-03 ENCOUNTER — Encounter: Payer: Self-pay | Admitting: Orthopaedic Surgery

## 2016-08-03 VITALS — BP 144/104 | HR 82 | Temp 98.1°F | Ht 71.0 in | Wt 171.0 lb

## 2016-08-03 DIAGNOSIS — F17210 Nicotine dependence, cigarettes, uncomplicated: Secondary | ICD-10-CM | POA: Diagnosis not present

## 2016-08-03 DIAGNOSIS — S86912D Strain of unspecified muscle(s) and tendon(s) at lower leg level, left leg, subsequent encounter: Secondary | ICD-10-CM

## 2016-08-03 DIAGNOSIS — G8929 Other chronic pain: Secondary | ICD-10-CM | POA: Diagnosis not present

## 2016-08-03 DIAGNOSIS — M25562 Pain in left knee: Secondary | ICD-10-CM

## 2016-08-03 MED ORDER — HYDROCODONE-ACETAMINOPHEN 5-325 MG PO TABS: 1.0000 | ORAL_TABLET | Freq: Four times a day (QID) | ORAL | 0 refills | Status: DC | PRN

## 2016-08-03 NOTE — Patient Instructions (Signed)
Steps to Quit Smoking Smoking tobacco can be bad for your health. It can also affect almost every organ in your body. Smoking puts you and people around you at risk for many serious Kasin Tonkinson-lasting (chronic) diseases. Quitting smoking is hard, but it is one of the best things that you can do for your health. It is never too late to quit. What are the benefits of quitting smoking? When you quit smoking, you lower your risk for getting serious diseases and conditions. They can include:  Lung cancer or lung disease.  Heart disease.  Stroke.  Heart attack.  Not being able to have children (infertility).  Weak bones (osteoporosis) and broken bones (fractures).  If you have coughing, wheezing, and shortness of breath, those symptoms may get better when you quit. You may also get sick less often. If you are pregnant, quitting smoking can help to lower your chances of having a baby of low birth weight. What can I do to help me quit smoking? Talk with your doctor about what can help you quit smoking. Some things you can do (strategies) include:  Quitting smoking totally, instead of slowly cutting back how much you smoke over a period of time.  Going to in-person counseling. You are more likely to quit if you go to many counseling sessions.  Using resources and support systems, such as: ? Online chats with a counselor. ? Phone quitlines. ? Printed self-help materials. ? Support groups or group counseling. ? Text messaging programs. ? Mobile phone apps or applications.  Taking medicines. Some of these medicines may have nicotine in them. If you are pregnant or breastfeeding, do not take any medicines to quit smoking unless your doctor says it is okay. Talk with your doctor about counseling or other things that can help you.  Talk with your doctor about using more than one strategy at the same time, such as taking medicines while you are also going to in-person counseling. This can help make  quitting easier. What things can I do to make it easier to quit? Quitting smoking might feel very hard at first, but there is a lot that you can do to make it easier. Take these steps:  Talk to your family and friends. Ask them to support and encourage you.  Call phone quitlines, reach out to support groups, or work with a counselor.  Ask people who smoke to not smoke around you.  Avoid places that make you want (trigger) to smoke, such as: ? Bars. ? Parties. ? Smoke-break areas at work.  Spend time with people who do not smoke.  Lower the stress in your life. Stress can make you want to smoke. Try these things to help your stress: ? Getting regular exercise. ? Deep-breathing exercises. ? Yoga. ? Meditating. ? Doing a body scan. To do this, close your eyes, focus on one area of your body at a time from head to toe, and notice which parts of your body are tense. Try to relax the muscles in those areas.  Download or buy apps on your mobile phone or tablet that can help you stick to your quit plan. There are many free apps, such as QuitGuide from the CDC (Centers for Disease Control and Prevention). You can find more support from smokefree.gov and other websites.  This information is not intended to replace advice given to you by your health care provider. Make sure you discuss any questions you have with your health care provider. Document Released: 12/05/2008 Document   Revised: 10/07/2015 Document Reviewed: 06/25/2014 Elsevier Interactive Patient Education  2018 [employer].  

## 2016-08-03 NOTE — Progress Notes (Signed)
Patient Bobby York, male DOB:1956/02/01, 61 y.o. ELF:[id number]  Chief Complaint  Patient presents with  . Follow-up    Left knee pain    HPI  JABIN TAPP is a 61 y.o. male who has left knee pain. He is better but still has pain.  MRI had shown MCL strain on the left.  He is using his brace. He has no new trauma, no locking, no redness.  He is active. HPI  Body mass index is 23.85 kg/m.  ROS  Review of Systems  HENT: Negative for congestion.   Respiratory: Negative for cough and shortness of breath.   Cardiovascular: Negative for chest pain and leg swelling.  Endocrine: Negative for cold intolerance.  Musculoskeletal: Positive for arthralgias, gait problem and joint swelling.  Allergic/Immunologic: Negative for environmental allergies.    Past Medical History:  Diagnosis Date  . Bipolar 1 disorder ([place])    diagnosed 2004  . Hepatitis    Hep C    Past Surgical History:  Procedure Laterality Date  . BACK SURGERY     two-lumbar disc x2  . COLONOSCOPY WITH PROPOFOL N/A 07/25/2014   HEN:IDPOEU rectal polyp otherwise normal . hyperplastic polyp. TCS in 07/2024  . ESOPHAGEAL DILATION N/A 07/25/2014   Procedure: ESOPHAGEAL DILATION;  Surgeon: Daneil Dolin, MD;  Location: [facility];  Service: Endoscopy;  Laterality: N/A;  Malony dilators # 56,  . ESOPHAGOGASTRODUODENOSCOPY (EGD) WITH PROPOFOL N/A 07/25/2014   MPN:TIRW erosive reflux/s/p dilator/HH. +hpylor     Family History  Problem Relation Age of Onset  . Heart disease Mother   . Diabetes Mother   . Prostate cancer Father   . Liver disease Neg Hx   . Colon cancer Neg Hx     Social History Social History  Substance Use Topics  . Smoking status: Current Every Day Smoker    Packs/day: 0.02    Years: 40.00    Types: Cigarettes    Start date: 05/28/1970  . Smokeless tobacco: Never Used     Comment: 3 cigarettes daily  . Alcohol use 0.0 oz/week     Comment: Occ    No Known Allergies  Current Outpatient  Prescriptions  Medication Sig Dispense Refill  . ALPRAZolam (XANAX) 1 MG tablet Take 1 mg by mouth at bedtime. May take twice daily as needed for anxiety    . diclofenac sodium (VOLTAREN) 1 % GEL Apply 4 g topically 4 (four) times daily. 100 g 0  . HYDROcodone-acetaminophen (NORCO/VICODIN) 5-325 MG tablet One tablet by mouth every six hours as needed for pain.  Seven day limit 28 tablet 0  . HYDROcodone-acetaminophen (NORCO/VICODIN) 5-325 MG tablet Take 1 tablet by mouth every 6 (six) hours as needed for moderate pain (Must last 14 days.Do not take and drive a car or use machinery.). 56 tablet 0  . mirtazapine (REMERON) 15 MG tablet Take 15 mg by mouth at bedtime.    Marland Kitchen VIAGRA 100 MG tablet Take 100 mg by mouth as needed for erectile dysfunction.   10   No current facility-administered medications for this visit.      Physical Exam  Blood pressure (!) 144/104, pulse 82, temperature 98.1 F (36.7 C), height 5\' 11"  (1.803 m), weight 171 lb (77.6 kg).  Constitutional: overall normal hygiene, normal nutrition, well developed, normal grooming, normal body habitus. Assistive device:hinge knee brace  Musculoskeletal: gait and station Limp left, muscle tone and strength are normal, no tremors or atrophy is present.  .  Neurological: coordination  overall normal.  Deep tendon reflex/nerve stretch intact.  Sensation normal.  Cranial nerves II-XII intact.   Skin:   Normal overall no scars, lesions, ulcers or rashes. No psoriasis.  Psychiatric: Alert and oriented x 3.  Recent memory intact, remote memory unclear.  Normal mood and affect. Well groomed.  Good eye contact.  Cardiovascular: overall no swelling, no varicosities, no edema bilaterally, normal temperatures of the legs and arms, no clubbing, cyanosis and good capillary refill.  Lymphatic: palpation is normal.  The left lower extremity is examined:  Inspection:  Thigh:  Non-tender and no defects  Knee has swelling 1+ effusion.                         Joint tenderness is present                        Patient is tender over the medial joint line  Lower Leg:  Has normal appearance and no tenderness or defects  Ankle:  Non-tender and no defects  Foot:  Non-tender and no defects Range of Motion:  Knee:  Range of motion is: 0-105                        Crepitus is  present  Ankle:  Range of motion is normal. Strength and Tone:  The left lower extremity has normal strength and tone. Stability:  Knee:  The knee is stable.  Ankle:  The ankle is stable.    The patient has been educated about the nature of the problem(s) and counseled on treatment options.  The patient appeared to understand what I have discussed and is in agreement with it.  Encounter Diagnoses  Name Primary?  . Chronic pain of left knee Yes  . Strain of left knee, subsequent encounter   . Cigarette nicotine dependence without complication     PLAN Call if any problems.  Precautions discussed.  Continue current medications.   Return to clinic 1 month   I have reviewed the [place] web site prior to prescribing narcotic medicine for this patient.  Electronically Signed Sanjuana Kava, MD 6/12/20189:44 AM

## 2016-08-31 ENCOUNTER — Ambulatory Visit (INDEPENDENT_AMBULATORY_CARE_PROVIDER_SITE_OTHER): Payer: Medicare Other | Admitting: Orthopaedic Surgery

## 2016-08-31 ENCOUNTER — Encounter: Payer: Self-pay | Admitting: Orthopaedic Surgery

## 2016-08-31 VITALS — BP 134/89 | HR 90 | Temp 97.0°F | Ht 71.0 in | Wt 169.0 lb

## 2016-08-31 DIAGNOSIS — F17210 Nicotine dependence, cigarettes, uncomplicated: Secondary | ICD-10-CM | POA: Diagnosis not present

## 2016-08-31 DIAGNOSIS — M25562 Pain in left knee: Secondary | ICD-10-CM

## 2016-08-31 DIAGNOSIS — G8929 Other chronic pain: Secondary | ICD-10-CM | POA: Diagnosis not present

## 2016-08-31 MED ORDER — HYDROCODONE-ACETAMINOPHEN 5-325 MG PO TABS: 1.0000 | ORAL_TABLET | Freq: Four times a day (QID) | ORAL | 0 refills | Status: DC | PRN

## 2016-08-31 NOTE — Progress Notes (Addendum)
Patient Bobby York, male DOB:[birth year], 61 y.o. [id number]  Chief Complaint  Patient presents with  . Follow-up    left knee pain    HPI  Bobby York is a 61 y.o. male who has left knee pain and MCL strain. He is wearing a knee brace. He is better.  He has less pain and less swelling.  He is walking better.  He has no new trauma, no locking. HPI  Body mass index is 23.57 kg/m.  ROS  Review of Systems  HENT: Negative for congestion.   Respiratory: Negative for cough and shortness of breath.   Cardiovascular: Negative for chest pain and leg swelling.  Endocrine: Negative for cold intolerance.  Musculoskeletal: Positive for arthralgias, gait problem and joint swelling.  Allergic/Immunologic: Negative for environmental allergies.    Past Medical History:  Diagnosis Date  . Bipolar 1 disorder ([place])    diagnosed 2004  . Hepatitis    Hep C    Past Surgical History:  Procedure Laterality Date  . BACK SURGERY     two-lumbar disc x2  . COLONOSCOPY WITH PROPOFOL N/A 07/25/2014   UVO:[id number] rectal polyp otherwise normal . hyperplastic polyp. TCS in 07/2024  . ESOPHAGEAL DILATION N/A 07/25/2014   Procedure: ESOPHAGEAL DILATION;  Surgeon: Daneil Dolin, MD;  Location: [facility];  Service: Endoscopy;  Laterality: N/A;  Malony dilators # 56,  . ESOPHAGOGASTRODUODENOSCOPY (EGD) WITH PROPOFOL N/A 07/25/2014   IHK:VQQV erosive reflux/s/p dilator/HH. +hpylor     Family History  Problem Relation Age of Onset  . Heart disease Mother   . Diabetes Mother   . Prostate cancer Father   . Liver disease Neg Hx   . Colon cancer Neg Hx     Social History Social History  Substance Use Topics  . Smoking status: Current Every Day Smoker    Packs/day: 0.02    Years: 40.00    Types: Cigarettes    Start date: 05/28/1970  . Smokeless tobacco: Never Used     Comment: 3 cigarettes daily  . Alcohol use 0.0 oz/week     Comment: Occ    No Known Allergies  Current Outpatient  Prescriptions  Medication Sig Dispense Refill  . ALPRAZolam (XANAX) 1 MG tablet Take 1 mg by mouth at bedtime. May take twice daily as needed for anxiety    . diclofenac sodium (VOLTAREN) 1 % GEL Apply 4 g topically 4 (four) times daily. 100 g 0  . HYDROcodone-acetaminophen (NORCO/VICODIN) 5-325 MG tablet One tablet by mouth every six hours as needed for pain.  Seven day limit 28 tablet 0  . HYDROcodone-acetaminophen (NORCO/VICODIN) 5-325 MG tablet Take 1 tablet by mouth every 6 (six) hours as needed for moderate pain (Must last 14 days.Do not take and drive a car or use machinery.). 56 tablet 0  . mirtazapine (REMERON) 15 MG tablet Take 15 mg by mouth at bedtime.    Marland Kitchen VIAGRA 100 MG tablet Take 100 mg by mouth as needed for erectile dysfunction.   10   No current facility-administered medications for this visit.      Physical Exam  Blood pressure 134/89, pulse 90, temperature (!) 97 F (36.1 C), height 5\' 11"  (1.803 m), weight 169 lb (76.7 kg).  Constitutional: overall normal hygiene, normal nutrition, well developed, normal grooming, normal body habitus. Assistive device:hinge knee brace  Musculoskeletal: gait and station Limp left slight, muscle tone and strength are normal, no tremors or atrophy is present.  .  Neurological: coordination overall  normal.  Deep tendon reflex/nerve stretch intact.  Sensation normal.  Cranial nerves II-XII intact.   Skin:   Normal overall no scars, lesions, ulcers or rashes. No psoriasis.  Psychiatric: Alert and oriented x 3.  Recent memory intact, remote memory unclear.  Normal mood and affect. Well groomed.  Good eye contact.  Cardiovascular: overall no swelling, no varicosities, no edema bilaterally, normal temperatures of the legs and arms, no clubbing, cyanosis and good capillary refill.  Lymphatic: palpation is normal.  The left lower extremity is examined:  Inspection:  Thigh:  Non-tender and no defects  Knee has swelling 1/2+ effusion.                         Joint tenderness is present                        Patient is tender over the medial joint line  Lower Leg:  Has normal appearance and no tenderness or defects  Ankle:  Non-tender and no defects  Foot:  Non-tender and no defects Range of Motion:  Knee:  Range of motion is: 0-115                        Crepitus is  present  Ankle:  Range of motion is normal. Strength and Tone:  The left lower extremity has normal strength and tone. Stability:  Knee:  The knee is stable.  Ankle:  The ankle is stable.    The patient has been educated about the nature of the problem(s) and counseled on treatment options.  The patient appeared to understand what I have discussed and is in agreement with it.  Encounter Diagnoses  Name Primary?  . Chronic pain of left knee Yes  . Cigarette nicotine dependence without complication     PLAN Call if any problems.  Precautions discussed.  Continue current medications.   Return to clinic 1 month   I had a good talk about his smoking and his need to stop.  He stopped for 11 years in the past but started back.  He will see his family doctor about this tomorrow. I have encouraged him to ask for patches to help stop.  He says he will.   I have reviewed the [place] web site prior to prescribing narcotic medicine for this patient.  Electronically Signed Sanjuana Kava, MD 7/10/20188:37 AM

## 2016-08-31 NOTE — Patient Instructions (Signed)
Steps to Quit Smoking Smoking tobacco can be bad for your health. It can also affect almost every organ in your body. Smoking puts you and people around you at risk for many serious long-lasting (chronic) diseases. Quitting smoking is hard, but it is one of the best things that you can do for your health. It is never too late to quit. What are the benefits of quitting smoking? When you quit smoking, you lower your risk for getting serious diseases and conditions. They can include:  Lung cancer or lung disease.  Heart disease.  Stroke.  Heart attack.  Not being able to have children (infertility).  Weak bones (osteoporosis) and broken bones (fractures).  If you have coughing, wheezing, and shortness of breath, those symptoms may get better when you quit. You may also get sick less often. If you are pregnant, quitting smoking can help to lower your chances of having a baby of low birth weight. What can I do to help me quit smoking? Talk with your doctor about what can help you quit smoking. Some things you can do (strategies) include:  Quitting smoking totally, instead of slowly cutting back how much you smoke over a period of time.  Going to in-person counseling. You are more likely to quit if you go to many counseling sessions.  Using resources and support systems, such as: ? Online chats with a counselor. ? Phone quitlines. ? Printed self-help materials. ? Support groups or group counseling. ? Text messaging programs. ? Mobile phone apps or applications.  Taking medicines. Some of these medicines may have nicotine in them. If you are pregnant or breastfeeding, do not take any medicines to quit smoking unless your doctor says it is okay. Talk with your doctor about counseling or other things that can help you.  Talk with your doctor about using more than one strategy at the same time, such as taking medicines while you are also going to in-person counseling. This can help make  quitting easier. What things can I do to make it easier to quit? Quitting smoking might feel very hard at first, but there is a lot that you can do to make it easier. Take these steps:  Talk to your family and friends. Ask them to support and encourage you.  Call phone quitlines, reach out to support groups, or work with a counselor.  Ask people who smoke to not smoke around you.  Avoid places that make you want (trigger) to smoke, such as: ? Bars. ? Parties. ? Smoke-break areas at work.  Spend time with people who do not smoke.  Lower the stress in your life. Stress can make you want to smoke. Try these things to help your stress: ? Getting regular exercise. ? Deep-breathing exercises. ? Yoga. ? Meditating. ? Doing a body scan. To do this, close your eyes, focus on one area of your body at a time from head to toe, and notice which parts of your body are tense. Try to relax the muscles in those areas.  Download or buy apps on your mobile phone or tablet that can help you stick to your quit plan. There are many free apps, such as QuitGuide from the CDC (Centers for Disease Control and Prevention). You can find more support from smokefree.gov and other websites.  This information is not intended to replace advice given to you by your health care provider. Make sure you discuss any questions you have with your health care provider. Document Released: 12/05/2008 Document   Revised: 10/07/2015 Document Reviewed: 06/25/2014 Elsevier Interactive Patient Education  2018 Elsevier Inc.  

## 2016-09-01 DIAGNOSIS — Z1389 Encounter for screening for other disorder: Secondary | ICD-10-CM | POA: Diagnosis not present

## 2016-09-01 DIAGNOSIS — F332 Major depressive disorder, recurrent severe without psychotic features: Secondary | ICD-10-CM | POA: Diagnosis not present

## 2016-09-01 DIAGNOSIS — N529 Male erectile dysfunction, unspecified: Secondary | ICD-10-CM | POA: Diagnosis not present

## 2016-09-01 DIAGNOSIS — R7989 Other specified abnormal findings of blood chemistry: Secondary | ICD-10-CM | POA: Diagnosis not present

## 2016-09-01 DIAGNOSIS — R7309 Other abnormal glucose: Secondary | ICD-10-CM | POA: Diagnosis not present

## 2016-09-01 DIAGNOSIS — E782 Mixed hyperlipidemia: Secondary | ICD-10-CM | POA: Diagnosis not present

## 2016-09-01 DIAGNOSIS — B182 Chronic viral hepatitis C: Secondary | ICD-10-CM | POA: Diagnosis not present

## 2016-09-01 DIAGNOSIS — Z6823 Body mass index (BMI) 23.0-23.9, adult: Secondary | ICD-10-CM | POA: Diagnosis not present

## 2016-09-14 DIAGNOSIS — N529 Male erectile dysfunction, unspecified: Secondary | ICD-10-CM | POA: Diagnosis not present

## 2016-09-28 ENCOUNTER — Encounter: Payer: Self-pay | Admitting: Orthopaedic Surgery

## 2016-09-28 ENCOUNTER — Ambulatory Visit (INDEPENDENT_AMBULATORY_CARE_PROVIDER_SITE_OTHER): Payer: Medicare Other | Admitting: Orthopaedic Surgery

## 2016-09-28 VITALS — BP 134/94 | HR 88 | Temp 97.1°F | Ht 71.0 in | Wt 170.0 lb

## 2016-09-28 DIAGNOSIS — M25562 Pain in left knee: Secondary | ICD-10-CM

## 2016-09-28 DIAGNOSIS — G8929 Other chronic pain: Secondary | ICD-10-CM | POA: Diagnosis not present

## 2016-09-28 DIAGNOSIS — F17210 Nicotine dependence, cigarettes, uncomplicated: Secondary | ICD-10-CM | POA: Diagnosis not present

## 2016-09-28 MED ORDER — HYDROCODONE-ACETAMINOPHEN 5-325 MG PO TABS: 1.0000 | ORAL_TABLET | Freq: Four times a day (QID) | ORAL | 0 refills | Status: DC | PRN

## 2016-09-28 NOTE — Patient Instructions (Addendum)
MRI AT [facility] 09/30/16 at 4:30pm  Steps to Quit Smoking Smoking tobacco can be bad for your health. It can also affect almost every organ in your body. Smoking puts you and people around you at risk for many serious Brixon Zhen-lasting (chronic) diseases. Quitting smoking is hard, but it is one of the best things that you can do for your health. It is never too late to quit. What are the benefits of quitting smoking? When you quit smoking, you lower your risk for getting serious diseases and conditions. They can include:  Lung cancer or lung disease.  Heart disease.  Stroke.  Heart attack.  Not being able to have children (infertility).  Weak bones (osteoporosis) and broken bones (fractures).  If you have coughing, wheezing, and shortness of breath, those symptoms may get better when you quit. You may also get sick less often. If you are pregnant, quitting smoking can help to lower your chances of having a baby of low birth weight. What can I do to help me quit smoking? Talk with your doctor about what can help you quit smoking. Some things you can do (strategies) include:  Quitting smoking totally, instead of slowly cutting back how much you smoke over a period of time.  Going to in-person counseling. You are more likely to quit if you go to many counseling sessions.  Using resources and support systems, such as: ? Database administrator with a Social worker. ? Phone quitlines. ? Careers information officer. ? Support groups or group counseling. ? Text messaging programs. ? Mobile phone apps or applications.  Taking medicines. Some of these medicines may have nicotine in them. If you are pregnant or breastfeeding, do not take any medicines to quit smoking unless your doctor says it is okay. Talk with your doctor about counseling or other things that can help you.  Talk with your doctor about using more than one strategy at the same time, such as taking medicines while you are also going to  in-person counseling. This can help make quitting easier. What things can I do to make it easier to quit? Quitting smoking might feel very hard at first, but there is a lot that you can do to make it easier. Take these steps:  Talk to your family and friends. Ask them to support and encourage you.  Call phone quitlines, reach out to support groups, or work with a Social worker.  Ask people who smoke to not smoke around you.  Avoid places that make you want (trigger) to smoke, such as: ? Bars. ? Parties. ? Smoke-break areas at work.  Spend time with people who do not smoke.  Lower the stress in your life. Stress can make you want to smoke. Try these things to help your stress: ? Getting regular exercise. ? Deep-breathing exercises. ? Yoga. ? Meditating. ? Doing a body scan. To do this, close your eyes, focus on one area of your body at a time from head to toe, and notice which parts of your body are tense. Try to relax the muscles in those areas.  Download or buy apps on your mobile phone or tablet that can help you stick to your quit plan. There are many free apps, such as QuitGuide from the [employer] Office manager for Disease Control and Prevention). You can find more support from smokefree.gov and other websites.  This information is not intended to replace advice given to you by your health care provider. Make sure you discuss any questions you have with  your health care provider. Document Released: 12/05/2008 Document Revised: 10/07/2015 Document Reviewed: 06/25/2014 Elsevier Interactive Patient Education  2018 [employer].

## 2016-09-28 NOTE — Progress Notes (Signed)
Patient Bobby York, male DOB:Jan 25, 1956, 61 y.o. UDJ:[id number]  Chief Complaint  Patient presents with  . Follow-up    Chronic pain left knee    HPI  Bobby York is a 61 y.o. male who has continued pain of the left knee.  He has pain still over the MCL and the medial joint line.  He uses a brace. He has some giving way now if he does not use the brace.  I will get a MRI.  I am concerned about a medial meniscal injury.   HPI  Body mass index is 23.71 kg/m.  ROS  Review of Systems  HENT: Negative for congestion.   Respiratory: Negative for cough and shortness of breath.   Cardiovascular: Negative for chest pain and leg swelling.  Endocrine: Negative for cold intolerance.  Musculoskeletal: Positive for arthralgias, gait problem and joint swelling.  Allergic/Immunologic: Negative for environmental allergies.    Past Medical History:  Diagnosis Date  . Bipolar 1 disorder ([place])    diagnosed 2004  . Hepatitis    Hep C    Past Surgical History:  Procedure Laterality Date  . BACK SURGERY     two-lumbar disc x2  . COLONOSCOPY WITH PROPOFOL N/A 07/25/2014   HYI:FOYDXA rectal polyp otherwise normal . hyperplastic polyp. TCS in 07/2024  . ESOPHAGEAL DILATION N/A 07/25/2014   Procedure: ESOPHAGEAL DILATION;  Surgeon: Daneil Dolin, MD;  Location: [facility];  Service: Endoscopy;  Laterality: N/A;  Malony dilators # 56,  . ESOPHAGOGASTRODUODENOSCOPY (EGD) WITH PROPOFOL N/A 07/25/2014   JOI:NOMV erosive reflux/s/p dilator/HH. +hpylor     Family History  Problem Relation Age of Onset  . Heart disease Mother   . Diabetes Mother   . Prostate cancer Father   . Liver disease Neg Hx   . Colon cancer Neg Hx     Social History Social History  Substance Use Topics  . Smoking status: Current Every Day Smoker    Packs/day: 0.02    Years: 40.00    Types: Cigarettes    Start date: 05/28/1970  . Smokeless tobacco: Never Used     Comment: 3 cigarettes daily  . Alcohol use 0.0  oz/week     Comment: Occ    No Known Allergies  Current Outpatient Prescriptions  Medication Sig Dispense Refill  . ALPRAZolam (XANAX) 1 MG tablet Take 1 mg by mouth at bedtime. May take twice daily as needed for anxiety    . diclofenac sodium (VOLTAREN) 1 % GEL Apply 4 g topically 4 (four) times daily. 100 g 0  . HYDROcodone-acetaminophen (NORCO/VICODIN) 5-325 MG tablet One tablet by mouth every six hours as needed for pain.  Seven day limit 28 tablet 0  . HYDROcodone-acetaminophen (NORCO/VICODIN) 5-325 MG tablet Take 1 tablet by mouth every 6 (six) hours as needed for moderate pain (Must last 14 days.Do not take and drive a car or use machinery.). 45 tablet 0  . mirtazapine (REMERON) 15 MG tablet Take 15 mg by mouth at bedtime.    Marland Kitchen VIAGRA 100 MG tablet Take 100 mg by mouth as needed for erectile dysfunction.   10   No current facility-administered medications for this visit.      Physical Exam  Blood pressure (!) 134/94, pulse 88, temperature (!) 97.1 F (36.2 C), height 5\' 11"  (1.803 m), weight 170 lb (77.1 kg).  Constitutional: overall normal hygiene, normal nutrition, well developed, normal grooming, normal body habitus. Assistive device:patella tracker brace  Musculoskeletal: gait and station Limp  left, muscle tone and strength are normal, no tremors or atrophy is present.  .  Neurological: coordination overall normal.  Deep tendon reflex/nerve stretch intact.  Sensation normal.  Cranial nerves II-XII intact.   Skin:   Normal overall no scars, lesions, ulcers or rashes. No psoriasis.  Psychiatric: Alert and oriented x 3.  Recent memory intact, remote memory unclear.  Normal mood and affect. Well groomed.  Good eye contact.  Cardiovascular: overall no swelling, no varicosities, no edema bilaterally, normal temperatures of the legs and arms, no clubbing, cyanosis and good capillary refill.  Lymphatic: palpation is normal.  The left lower extremity is  examined:  Inspection:  Thigh:  Non-tender and no defects  Knee has swelling 1+ effusion.                        Joint tenderness is present                        Patient is tender over the medial joint line  Lower Leg:  Has normal appearance and no tenderness or defects  Ankle:  Non-tender and no defects  Foot:  Non-tender and no defects Range of Motion:  Knee:  Range of motion is: 0-110                        Crepitus is  present  Ankle:  Range of motion is normal. Strength and Tone:  The left lower extremity has normal strength and tone. Stability:  Knee:  The knee has medial positive [place]ray.  Ankle:  The ankle is stable.    The patient has been educated about the nature of the problem(s) and counseled on treatment options.  The patient appeared to understand what I have discussed and is in agreement with it.  Encounter Diagnoses  Name Primary?  . Chronic pain of left knee Yes  . Cigarette nicotine dependence without complication     PLAN Call if any problems.  Precautions discussed.  Continue current medications.   Return to clinic after MRI of the left knee   I have reviewed the [place] web site prior to prescribing narcotic medicine for this patient.  Electronically Signed Sanjuana Kava, MD 8/7/20189:11 AM

## 2016-09-30 ENCOUNTER — Ambulatory Visit (HOSPITAL_COMMUNITY)
Admission: RE | Admit: 2016-09-30 | Discharge: 2016-09-30 | Disposition: A | Discharge status: Home / Self Care | Payer: Medicare Other | Source: Ambulatory Visit | Attending: Orthopaedic Surgery | Admitting: Orthopaedic Surgery

## 2016-09-30 DIAGNOSIS — M7122 Synovial cyst of popliteal space [Baker], left knee: Secondary | ICD-10-CM | POA: Insufficient documentation

## 2016-09-30 DIAGNOSIS — M25562 Pain in left knee: Secondary | ICD-10-CM | POA: Insufficient documentation

## 2016-09-30 DIAGNOSIS — M25462 Effusion, left knee: Secondary | ICD-10-CM | POA: Diagnosis not present

## 2016-09-30 DIAGNOSIS — G8929 Other chronic pain: Secondary | ICD-10-CM | POA: Insufficient documentation

## 2016-09-30 DIAGNOSIS — X58XXXA Exposure to other specified factors, initial encounter: Secondary | ICD-10-CM | POA: Insufficient documentation

## 2016-09-30 DIAGNOSIS — S83412A Sprain of medial collateral ligament of left knee, initial encounter: Secondary | ICD-10-CM | POA: Diagnosis not present

## 2016-09-30 DIAGNOSIS — S76112A Strain of left quadriceps muscle, fascia and tendon, initial encounter: Secondary | ICD-10-CM | POA: Insufficient documentation

## 2016-09-30 DIAGNOSIS — M25561 Pain in right knee: Secondary | ICD-10-CM | POA: Diagnosis not present

## 2016-10-05 ENCOUNTER — Encounter: Payer: Self-pay | Admitting: Orthopaedic Surgery

## 2016-10-05 ENCOUNTER — Ambulatory Visit (INDEPENDENT_AMBULATORY_CARE_PROVIDER_SITE_OTHER): Payer: Medicare Other | Admitting: Orthopaedic Surgery

## 2016-10-05 VITALS — BP 158/101 | HR 76 | Ht 71.0 in | Wt 170.0 lb

## 2016-10-05 DIAGNOSIS — M25562 Pain in left knee: Secondary | ICD-10-CM

## 2016-10-05 DIAGNOSIS — S83512A Sprain of anterior cruciate ligament of left knee, initial encounter: Secondary | ICD-10-CM

## 2016-10-05 DIAGNOSIS — G8929 Other chronic pain: Secondary | ICD-10-CM

## 2016-10-05 DIAGNOSIS — F17210 Nicotine dependence, cigarettes, uncomplicated: Secondary | ICD-10-CM | POA: Diagnosis not present

## 2016-10-05 NOTE — Patient Instructions (Signed)
Steps to Quit Smoking Smoking tobacco can be bad for your health. It can also affect almost every organ in your body. Smoking puts you and people around you at risk for many serious long-lasting (chronic) diseases. Quitting smoking is hard, but it is one of the best things that you can do for your health. It is never too late to quit. What are the benefits of quitting smoking? When you quit smoking, you lower your risk for getting serious diseases and conditions. They can include:  Lung cancer or lung disease.  Heart disease.  Stroke.  Heart attack.  Not being able to have children (infertility).  Weak bones (osteoporosis) and broken bones (fractures).  If you have coughing, wheezing, and shortness of breath, those symptoms may get better when you quit. You may also get sick less often. If you are pregnant, quitting smoking can help to lower your chances of having a baby of low birth weight. What can I do to help me quit smoking? Talk with your doctor about what can help you quit smoking. Some things you can do (strategies) include:  Quitting smoking totally, instead of slowly cutting back how much you smoke over a period of time.  Going to in-person counseling. You are more likely to quit if you go to many counseling sessions.  Using resources and support systems, such as: ? Online chats with a counselor. ? Phone quitlines. ? Printed self-help materials. ? Support groups or group counseling. ? Text messaging programs. ? Mobile phone apps or applications.  Taking medicines. Some of these medicines may have nicotine in them. If you are pregnant or breastfeeding, do not take any medicines to quit smoking unless your doctor says it is okay. Talk with your doctor about counseling or other things that can help you.  Talk with your doctor about using more than one strategy at the same time, such as taking medicines while you are also going to in-person counseling. This can help make  quitting easier. What things can I do to make it easier to quit? Quitting smoking might feel very hard at first, but there is a lot that you can do to make it easier. Take these steps:  Talk to your family and friends. Ask them to support and encourage you.  Call phone quitlines, reach out to support groups, or work with a counselor.  Ask people who smoke to not smoke around you.  Avoid places that make you want (trigger) to smoke, such as: ? Bars. ? Parties. ? Smoke-break areas at work.  Spend time with people who do not smoke.  Lower the stress in your life. Stress can make you want to smoke. Try these things to help your stress: ? Getting regular exercise. ? Deep-breathing exercises. ? Yoga. ? Meditating. ? Doing a body scan. To do this, close your eyes, focus on one area of your body at a time from head to toe, and notice which parts of your body are tense. Try to relax the muscles in those areas.  Download or buy apps on your mobile phone or tablet that can help you stick to your quit plan. There are many free apps, such as QuitGuide from the CDC (Centers for Disease Control and Prevention). You can find more support from smokefree.gov and other websites.  This information is not intended to replace advice given to you by your health care provider. Make sure you discuss any questions you have with your health care provider. Document Released: 12/05/2008 Document   Revised: 10/07/2015 Document Reviewed: 06/25/2014 Elsevier Interactive Patient Education  2018 Elsevier Inc.  

## 2016-10-05 NOTE — Progress Notes (Signed)
Patient Bobby York, male DOB:02-01-1956, 61 y.o. EQA:[id number]  Chief Complaint  Patient presents with  . Knee Pain    left    HPI  Bobby York is a 61 y.o. male who has left knee pain with instability. I had been concerned about medial tear but he had the MRI and it showed: IMPRESSION: 1. Chronic partial tears of the proximal ACL and PCL. The majority of the ACL fibers are torn. 2. Chronic grade 2 sprain of the MCL. 3. Tricompartmental chondromalacia, stable. 4. Stable moderate joint effusion. 5. Diminished tiny Baker's cyst.  I will have him see Dr. Aline Brochure for consideration of knee surgery.  I have explained the problem with him and have shown him a model of the knee. HPI  Body mass index is 23.71 kg/m.  ROS  Review of Systems  HENT: Negative for congestion.   Respiratory: Negative for cough and shortness of breath.   Cardiovascular: Negative for chest pain and leg swelling.  Endocrine: Negative for cold intolerance.  Musculoskeletal: Positive for arthralgias, gait problem and joint swelling.  Allergic/Immunologic: Negative for environmental allergies.    Past Medical History:  Diagnosis Date  . Bipolar 1 disorder ([place])    diagnosed 2004  . Hepatitis    Hep C    Past Surgical History:  Procedure Laterality Date  . BACK SURGERY     two-lumbar disc x2  . COLONOSCOPY WITH PROPOFOL N/A 07/25/2014   LNL:GXQJJH rectal polyp otherwise normal . hyperplastic polyp. TCS in 07/2024  . ESOPHAGEAL DILATION N/A 07/25/2014   Procedure: ESOPHAGEAL DILATION;  Surgeon: Daneil Dolin, MD;  Location: [facility];  Service: Endoscopy;  Laterality: N/A;  Malony dilators # 56,  . ESOPHAGOGASTRODUODENOSCOPY (EGD) WITH PROPOFOL N/A 07/25/2014   ERD:EYCX erosive reflux/s/p dilator/HH. +hpylor     Family History  Problem Relation Age of Onset  . Heart disease Mother   . Diabetes Mother   . Prostate cancer Father   . Liver disease Neg Hx   . Colon cancer Neg Hx     Social  History Social History  Substance Use Topics  . Smoking status: Current Every Day Smoker    Packs/day: 0.02    Years: 40.00    Types: Cigarettes    Start date: 05/28/1970  . Smokeless tobacco: Never Used     Comment: 3 cigarettes daily  . Alcohol use 0.0 oz/week     Comment: Occ    No Known Allergies  Current Outpatient Prescriptions  Medication Sig Dispense Refill  . ALPRAZolam (XANAX) 1 MG tablet Take 1 mg by mouth at bedtime. May take twice daily as needed for anxiety    . diclofenac sodium (VOLTAREN) 1 % GEL Apply 4 g topically 4 (four) times daily. 100 g 0  . HYDROcodone-acetaminophen (NORCO/VICODIN) 5-325 MG tablet One tablet by mouth every six hours as needed for pain.  Seven day limit 28 tablet 0  . HYDROcodone-acetaminophen (NORCO/VICODIN) 5-325 MG tablet Take 1 tablet by mouth every 6 (six) hours as needed for moderate pain (Must last 14 days.Do not take and drive a car or use machinery.). 45 tablet 0  . mirtazapine (REMERON) 15 MG tablet Take 15 mg by mouth at bedtime.    Marland Kitchen VIAGRA 100 MG tablet Take 100 mg by mouth as needed for erectile dysfunction.   10   No current facility-administered medications for this visit.      Physical Exam  Blood pressure (!) 158/101, pulse 76, height 5\' 11"  (1.803 m),  weight 170 lb (77.1 kg).  Constitutional: overall normal hygiene, normal nutrition, well developed, normal grooming, normal body habitus. Assistive device:neoprene sleeve brace  Musculoskeletal: gait and station Limp left, muscle tone and strength are normal, no tremors or atrophy is present.  .  Neurological: coordination overall normal.  Deep tendon reflex/nerve stretch intact.  Sensation normal.  Cranial nerves II-XII intact.   Skin:   Normal overall no scars, lesions, ulcers or rashes. No psoriasis.  Psychiatric: Alert and oriented x 3.  Recent memory intact, remote memory unclear.  Normal mood and affect. Well groomed.  Good eye contact.  Cardiovascular: overall  no swelling, no varicosities, no edema bilaterally, normal temperatures of the legs and arms, no clubbing, cyanosis and good capillary refill.  Lymphatic: palpation is normal.  Left knee with effusion and pain and crepitus.  Medial joint line pain.  Positive Drawer and Lachman.  NV intact. ROM 0 to 110, limp left.  The patient has been educated about the nature of the problem(s) and counseled on treatment options.  The patient appeared to understand what I have discussed and is in agreement with it.  Encounter Diagnoses  Name Primary?  . Chronic pain of left knee Yes  . Cigarette nicotine dependence without complication   . Rupture of anterior cruciate ligament of left knee, initial encounter     PLAN Call if any problems.  Precautions discussed.  Continue current medications.   Return to clinic to see Dr. Aline Brochure for possible knee surgery.   Electronically Signed Sanjuana Kava, MD 8/14/201810:02 AM

## 2016-10-15 DIAGNOSIS — R7989 Other specified abnormal findings of blood chemistry: Secondary | ICD-10-CM | POA: Diagnosis not present

## 2016-11-02 ENCOUNTER — Ambulatory Visit (INDEPENDENT_AMBULATORY_CARE_PROVIDER_SITE_OTHER): Payer: Medicare Other | Admitting: Orthopedic Surgery

## 2016-11-02 ENCOUNTER — Encounter: Payer: Self-pay | Admitting: Orthopedic Surgery

## 2016-11-02 VITALS — BP 139/98 | HR 112 | Ht 72.0 in | Wt 172.0 lb

## 2016-11-02 DIAGNOSIS — M1712 Unilateral primary osteoarthritis, left knee: Secondary | ICD-10-CM

## 2016-11-02 DIAGNOSIS — S83512D Sprain of anterior cruciate ligament of left knee, subsequent encounter: Secondary | ICD-10-CM

## 2016-11-02 DIAGNOSIS — F17210 Nicotine dependence, cigarettes, uncomplicated: Secondary | ICD-10-CM

## 2016-11-02 MED ORDER — HYDROCODONE-ACETAMINOPHEN 5-325 MG PO TABS: ORAL_TABLET | ORAL | 0 refills | Status: DC

## 2016-11-02 NOTE — Patient Instructions (Addendum)
Health Risks of Smoking Smoking cigarettes is very bad for your health. Tobacco smoke has over 200 known poisons in it. It contains the poisonous gases nitrogen oxide and carbon monoxide. There are over 60 chemicals in tobacco smoke that cause cancer. Smoking is difficult to quit because a chemical in tobacco, called nicotine, causes addiction or dependence. When you smoke and inhale, nicotine is absorbed rapidly into the bloodstream through your lungs. Both inhaled and non-inhaled nicotine may be addictive. What are the risks of cigarette smoke? Cigarette smokers have an increased risk of many serious medical problems, including:  Lung cancer.  Lung disease, such as pneumonia, bronchitis, and emphysema.  Chest pain (angina) and heart attack because the heart is not getting enough oxygen.  Heart disease and peripheral blood vessel disease.  High blood pressure (hypertension).  Stroke.  Oral cancer, including cancer of the lip, mouth, or voice box.  Bladder cancer.  Pancreatic cancer.  Cervical cancer.  Pregnancy complications, including premature birth.  Stillbirths and smaller newborn babies, birth defects, and genetic damage to sperm.  Early menopause.  Lower estrogen level for women.  Infertility.  Facial wrinkles.  Blindness.  Increased risk of broken bones (fractures).  Senile dementia.  Stomach ulcers and internal bleeding.  Delayed wound healing and increased risk of complications during surgery.  Even smoking lightly shortens your life expectancy by several years.  Because of secondhand smoke exposure, children of smokers have an increased risk of the following:  Sudden infant death syndrome (SIDS).  Respiratory infections.  Lung cancer.  Heart disease.  Ear infections.  What are the benefits of quitting? There are many health benefits of quitting smoking. Here are some of them:  Within days of quitting smoking, your risk of having a heart  attack decreases, your blood flow improves, and your lung capacity improves. Blood pressure, pulse rate, and breathing patterns start returning to normal soon after quitting.  Within months, your lungs may clear up completely.  Quitting for 10 years reduces your risk of developing lung cancer and heart disease to almost that of a nonsmoker.  People who quit may see an improvement in their overall quality of life.  How do I quit smoking? Smoking is an addiction with both physical and psychological effects, and longtime habits can be hard to change. Your health care provider can recommend:  Programs and community resources, which may include group support, education, or talk therapy.  Prescription medicines to help reduce cravings.  Nicotine replacement products, such as patches, gum, and nasal sprays. Use these products only as directed. Do not replace cigarette smoking with electronic cigarettes, which are commonly called e-cigarettes. The safety of e-cigarettes is not known, and some may contain harmful chemicals.  A combination of two or more of these methods.  Where to find more information:  American Lung Association: www.lung.org  American Cancer Society: www.cancer.org Summary  Smoking cigarettes is very bad for your health. Cigarette smokers have an increased risk of many serious medical problems, including several cancers, heart disease, and stroke.  Smoking is an addiction with both physical and psychological effects, and longtime habits can be hard to change.  By stopping right away, you can greatly reduce the risk of medical problems for you and your family.  To help you quit smoking, your health care provider can recommend programs, community resources, prescription medicines, and nicotine replacement products such as patches, gum, and nasal sprays. This information is not intended to replace advice given to you by your health   care provider. Make sure you discuss any  questions you have with your health care provider. Document Released: 03/18/2004 Document Revised: 02/13/2016 Document Reviewed: 02/13/2016 Elsevier Interactive Patient Education  2017 [place] with Quitting Smoking Quitting smoking is a physical and mental challenge. You will face cravings, withdrawal symptoms, and temptation. Before quitting, work with your health care provider to make a plan that can help you cope. Preparation can help you quit and keep you from giving in. How can I cope with cravings? Cravings usually last for 5-10 minutes. If you get through it, the craving will pass. Consider taking the following actions to help you cope with cravings:  Keep your mouth busy: ? Chew sugar-free gum. ? Suck on hard candies or a straw. ? Brush your teeth.  Keep your hands and body busy: ? Immediately change to a different activity when you feel a craving. ? Squeeze or play with a ball. ? Do an activity or a hobby, like making bead jewelry, practicing needlepoint, or working with wood. ? Mix up your normal routine. ? Take a short exercise break. Go for a quick walk or run up and down stairs. ? Spend time in public places where smoking is not allowed.  Focus on doing something kind or helpful for someone else.  Call a friend or family member to talk during a craving.  Join a support group.  Call a quit line, such as 1-800-QUIT-NOW.  Talk with your health care provider about medicines that might help you cope with cravings and make quitting easier for you.  How can I deal with withdrawal symptoms? Your body may experience negative effects as it tries to get used to not having nicotine in the system. These effects are called withdrawal symptoms. They may include:  Feeling hungrier than normal.  Trouble concentrating.  Irritability.  Trouble sleeping.  Feeling depressed.  Restlessness and agitation.  Craving a cigarette.  To manage withdrawal  symptoms:  Avoid places, people, and activities that trigger your cravings.  Remember why you want to quit.  Get plenty of sleep.  Avoid coffee and other caffeinated drinks. These may worsen some of your symptoms.  How can I handle social situations? Social situations can be difficult when you are quitting smoking, especially in the first few weeks. To manage this, you can:  Avoid parties, bars, and other social situations where people might be smoking.  Avoid alcohol.  Leave right away if you have the urge to smoke.  Explain to your family and friends that you are quitting smoking. Ask for understanding and support.  Plan activities with friends or family where smoking is not an option.  What are some ways I can cope with stress? Wanting to smoke may cause stress, and stress can make you want to smoke. Find ways to manage your stress. Relaxation techniques can help. For example:  Breathe slowly and deeply, in through your nose and out through your mouth.  Listen to soothing, relaxing music.  Talk with a family member or friend about your stress.  Light a candle.  Soak in a bath or take a shower.  Think about a peaceful place.  What are some ways I can prevent weight gain? Be aware that many people gain weight after they quit smoking. However, not everyone does. To keep from gaining weight, have a plan in place before you quit and stick to the plan after you quit. Your plan should include:  Having healthy snacks. When you have  a craving, it may help to: ? Eat plain popcorn, crunchy carrots, celery, or other cut vegetables. ? Chew sugar-free gum.  Changing how you eat: ? Eat small portion sizes at meals. ? Eat 4-6 small meals throughout the day instead of 1-2 large meals a day. ? Be mindful when you eat. Do not watch television or do other things that might distract you as you eat.  Exercising regularly: ? Make time to exercise each day. If you do not have time for a  long workout, do short bouts of exercise for 5-10 minutes several times a day. ? Do some form of strengthening exercise, like weight lifting, and some form of aerobic exercise, like running or swimming.  Drinking plenty of water or other low-calorie or no-calorie drinks. Drink 6-8 glasses of water daily, or as much as instructed by your health care provider.  Summary  Quitting smoking is a physical and mental challenge. You will face cravings, withdrawal symptoms, and temptation to smoke again. Preparation can help you as you go through these challenges.  You can cope with cravings by keeping your mouth busy (such as by chewing gum), keeping your body and hands busy, and making calls to family, friends, or a helpline for people who want to quit smoking.  You can cope with withdrawal symptoms by avoiding places where people smoke, avoiding drinks with caffeine, and getting plenty of rest.  Ask your health care provider about the different ways to prevent weight gain, avoid stress, and handle social situations. This information is not intended to replace advice given to you by your health care provider. Make sure you discuss any questions you have with your health care provider. Document Released: 02/06/2016 Document Revised: 02/06/2016 Document Reviewed: 02/06/2016 Elsevier Interactive Patient Education  2018 [place] Dependency Chemical dependency is an addiction to drugs or alcohol. People with this addiction repeatedly seek out and use drugs or alcohol despite negative consequences to the health and safety of themselves and others. Addiction changes the way the brain works. Because of these changes, addiction is a chronic condition. The medical term for addiction or chemical dependency is substance use disorder. The disorder can be mild, moderate, or severe. People can be dependent on a range of substances. These include alcohol, prescription medicines, and illegal or street  drugs, such as marijuana, heroin, and cocaine. What are the causes? This condition is caused by the effect that the abused substance has on the brain. What increases the risk? The following factors may make you more likely to develop this condition:  Havinga family history of chemical dependency.  Having mental health issues, such as depression, anxiety, or bipolar disorder.  Living in an environment where drugs and alcohol are easily available.  Using drugs or alcohol at a young age.  Having friends who use drugs or alcohol.  Having poor social skills.  Tending to be aggressive or impulsive.  What are the signs or symptoms? Symptoms may vary depending on the substance that you are addicted to. Symptoms may include the following: Physical Symptoms  The inability to limit the use of drugs or alcohol.  Having nausea, sweating, shakiness, and anxiety when you are not using alcohol or drugs.  Needing a greater amount of drugs or alcohol to get the same effect (developing tolerance).  A change in: ? Sleeping habits. ? Eating or appetite. ? Appearance or how you care for yourself. Emotional Symptoms  Angry outbursts.  Periods of sadness and tearfulness.  Isolation.  Relationship Problems  Loved ones suggesting that you have a problem.  Increased fights.  Forgetting commitments.  Having affairs or one-night stands. Problems Related to Irresponsibility  Legal problems.  Irresponsibility with money.  Missing work.  Poor decision making. How is this diagnosed? This condition may be diagnosed based on your symptoms, your medical history, and a physical exam. You may also have blood tests and urine tests. How is this treated? Treatment for this condition depends on the substance that you are addicted to and whether your dependency is mild, moderate, or severe. Treatment options may include:  Stopping substance use safely. This may require taking medicines and being  closely observed for several days.  Taking part in group and individual counseling with mental health providers who help people with chemical dependency.  Staying at a residential treatment center for several days or weeks.  Attending daily counseling sessions at a treatment center.  Taking medicine as told by your health care provider to: ? Ease symptoms and prevent complications during withdrawal. ? Treat other mental health issues, such as depression or anxiety. ? Block cravings by causing the same effects as the substance. ? Block the effects of the substance or replace good sensations with unpleasant ones.  Going to a support group to share your experience with others who are going through the same thing. These groups are an important part of long-term recovery for many people. They include 12-step programs like Alcoholics Anonymous and Narcotics Anonymous.  Recovery can be a long process. Many people who undergo treatment start using the substance again after stopping. This is called a relapse. If you have a relapse, it does not mean that treatment will not work. Follow these instructions at home:  Avoid temptations or triggers that you associate with your use of the substance.  Learn and practice techniques for managing stress.  Have a plan for vulnerable moments.  Get phone numbers of those who are willing to help and who are committed to your recovery.  Know when and where the meetings that you have chosen will occur.  Take over-the-counter and prescription medicines only as told by your health care provider.  Keep all follow-up visits as told by your health care provider. This is important. Contact a health care provider if:  You cannot take your medicines as told.  Your symptoms get worse.  You have trouble resisting the urge to use drugs or alcohol.  You are in pain, shaking, sweating, or feeling generally unwell.  You are losing weight without trying to. Get  help right away if:  You lose consciousness.  Your breathing is slow.  Your pulse is slow or jumpy.  You have serious thoughts about hurting yourself or someone else.  You have a relapse. This information is not intended to replace advice given to you by your health care provider. Make sure you discuss any questions you have with your health care provider. Document Released: 02/02/2001 Document Revised: 03/17/2015 Document Reviewed: 10/16/2014 Elsevier Interactive Patient Education  [employer].

## 2016-11-02 NOTE — Progress Notes (Signed)
Patient ID: LAMBERTO DINAPOLI, male   DOB: 1955-12-08, 61 y.o.   MRN: [id number]  Chief Complaint  Patient presents with  . Knee Problem    left    HPI GLYNN YEPES is a 61 y.o. male.   61 year old male fell off of a scooter a few weeks back. He was evaluated with an MRI in May and August of this year. He has a proximal tear anterior cruciate ligament attenuation of PCL MCL strain as well. We compartment arthrosis as well.  C/o pain left knee which is dull, aches and causes the need to give way     Review of Systems Review of Systems  Constitutional: Negative for fever and unexpected weight change.  Respiratory: Positive for shortness of breath.   Musculoskeletal: Positive for gait problem.  Neurological: Negative for numbness.   (2 MINIMUM)  Past Medical History:  Diagnosis Date  . Bipolar 1 disorder ([place])    diagnosed 2004  . Hepatitis    Hep C    Past Surgical History:  Procedure Laterality Date  . BACK SURGERY     two-lumbar disc x2  . COLONOSCOPY WITH PROPOFOL N/A 07/25/2014   VQM:GQQPYP rectal polyp otherwise normal . hyperplastic polyp. TCS in 07/2024  . ESOPHAGEAL DILATION N/A 07/25/2014   Procedure: ESOPHAGEAL DILATION;  Surgeon: Daneil Dolin, MD;  Location: [facility];  Service: Endoscopy;  Laterality: N/A;  Malony dilators # 56,  . ESOPHAGOGASTRODUODENOSCOPY (EGD) WITH PROPOFOL N/A 07/25/2014   PJK:DTOI erosive reflux/s/p dilator/HH. +hpylor     Social History Social History  Substance Use Topics  . Smoking status: Current Every Day Smoker    Packs/day: 0.02    Years: 40.00    Types: Cigarettes    Start date: 05/28/1970  . Smokeless tobacco: Never Used     Comment: 3 cigarettes daily  . Alcohol use 0.0 oz/week     Comment: Occ    No Known Allergies  Current Meds  Medication Sig  . ALPRAZolam (XANAX) 1 MG tablet Take 1 mg by mouth at bedtime. May take twice daily as needed for anxiety  . diclofenac sodium (VOLTAREN) 1 % GEL Apply 4 g topically 4 (four)  times daily.  . mirtazapine (REMERON) 15 MG tablet Take 15 mg by mouth at bedtime.  Marland Kitchen VIAGRA 100 MG tablet Take 100 mg by mouth as needed for erectile dysfunction.       Physical Exam Physical Exam 1.BP (!) 139/98   Pulse (!) 112   Ht 6' (1.829 m)   Wt 172 lb (78 kg)   BMI 23.33 kg/m   2. Gen. appearance. The patient is well-developed and well-nourished, grooming and hygiene are normal. There are no gross congenital abnormalities  3. The patient is alert and oriented to person place and time  4. Mood and affect are normal  5. Ambulation Mild limp with economy hinged brace  Examination reveals the following: 6. On inspection we find no effusion medial and lateral joint line tenderness  7. With the range of motion of  measured with goniometer 5-115 degrees   8. Stability tests PCL stable anterior cruciate ligament anterior drawer positive collateral ligaments normal at 0 and 30   9. Strength tests revealed grade 5 motor strength  10. Skin we find no rash ulceration or erythema  11. Sensation remains intact  12 Vascular system shows no peripheral edema   MEDICAL DECISION MAKING:    Data Reviewed MRI in May and August and x-ray Partial anterior  cruciate ligament PCL MCL tear arthrosis 3 compartments  Assessment Encounter Diagnoses  Name Primary?  . Cigarette nicotine dependence without complication   . Rupture of anterior cruciate ligament of left knee, subsequent encounter Yes  . Primary osteoarthritis of left knee      Plan Patient is 61 years old has hepatitis C, is a smoker and his knee does not show gross instability  Recommend 6 weeks of therapy and anterior cruciate ligament bracing  Final prescription of opiate given today and explained  Arther Abbott 11/02/2016, 9:20 AM

## 2016-11-30 ENCOUNTER — Telehealth: Payer: Self-pay | Admitting: Orthopedic Surgery

## 2016-11-30 NOTE — Telephone Encounter (Signed)
Patient requests refill on Hydrocodone/Acetaminophen 5-325  Mgs.   Qty  28   Sig: One tablet by mouth every six hours as needed for pain. Seven day limit

## 2016-12-01 ENCOUNTER — Telehealth: Payer: Self-pay | Admitting: Orthopedic Surgery

## 2016-12-01 NOTE — Telephone Encounter (Signed)
I relayed message to patient about his medication request. He wasn't happy about it and wanted to come in earlier for his appointment. He is scheduled to come in 10/22

## 2016-12-01 NOTE — Telephone Encounter (Signed)
I recommend tylenol or advil

## 2016-12-07 ENCOUNTER — Ambulatory Visit (HOSPITAL_COMMUNITY): Payer: Medicare Other | Attending: Orthopedic Surgery

## 2016-12-07 ENCOUNTER — Encounter (HOSPITAL_COMMUNITY): Payer: Self-pay

## 2016-12-07 DIAGNOSIS — M6281 Muscle weakness (generalized): Secondary | ICD-10-CM | POA: Insufficient documentation

## 2016-12-07 DIAGNOSIS — M25561 Pain in right knee: Secondary | ICD-10-CM | POA: Diagnosis not present

## 2016-12-07 DIAGNOSIS — G8929 Other chronic pain: Secondary | ICD-10-CM | POA: Insufficient documentation

## 2016-12-07 DIAGNOSIS — R262 Difficulty in walking, not elsewhere classified: Secondary | ICD-10-CM | POA: Diagnosis not present

## 2016-12-07 DIAGNOSIS — R29898 Other symptoms and signs involving the musculoskeletal system: Secondary | ICD-10-CM | POA: Insufficient documentation

## 2016-12-07 NOTE — Patient Instructions (Signed)
  HAMSTRING STRETCH WITH TOWEL  While lying down on your back, hook a towel or strap under  your foot and draw up your leg until a stretch is felt under your leg. calf area.  Keep your knee in a straightened position during the stretch.  Perform 2x/day, 3-5 stretches holding for 30-60 seconds   HEEL SLIDES - LONG SIT WITH TOWEL AND BELT  While in a sitting position, place a small hand towel under your heel. Next, loop a belt, towel or bed sheet around your foot and pull your knee into a bend position as your foot slides towards your buttock. Hold a gentle stretch and then return back to original position.  Perform 1-2x/day, 2-3 sets of 10 holding for 3-5 seconds
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
## 2016-12-09 ENCOUNTER — Encounter (HOSPITAL_COMMUNITY): Payer: Self-pay

## 2016-12-09 ENCOUNTER — Ambulatory Visit (HOSPITAL_COMMUNITY): Payer: Medicare Other

## 2016-12-09 DIAGNOSIS — G8929 Other chronic pain: Secondary | ICD-10-CM | POA: Diagnosis not present

## 2016-12-09 DIAGNOSIS — M25561 Pain in right knee: Secondary | ICD-10-CM | POA: Diagnosis not present

## 2016-12-09 DIAGNOSIS — R29898 Other symptoms and signs involving the musculoskeletal system: Secondary | ICD-10-CM

## 2016-12-09 DIAGNOSIS — M6281 Muscle weakness (generalized): Secondary | ICD-10-CM

## 2016-12-09 DIAGNOSIS — R262 Difficulty in walking, not elsewhere classified: Secondary | ICD-10-CM

## 2016-12-09 NOTE — Therapy (Signed)
[place], [place], [address] Phone: [phone]   Fax:  [phone]  Physical Therapy Treatment  Patient Details  Name: Bobby York MRN: [id number] Date of Birth: [birth year] Referring Provider: Arther Abbott, MD  Encounter Date: 12/09/2016      PT End of Session - 12/09/16 1529    Visit Number 2   Number of Visits 13   Date for PT Re-Evaluation 12/28/16   Authorization Type UMR/ [facility] PPO   Authorization Time Period 12/07/16 to 01/18/17   Authorization - Visit Number 2   Authorization - Number of Visits 30   PT Start Time 2831  pt late for apt   PT Stop Time 1610   PT Time Calculation (min) 45 min   Activity Tolerance Patient tolerated treatment well;No increased pain   Behavior During Therapy WFL for tasks assessed/performed      Past Medical History:  Diagnosis Date  . Bipolar 1 disorder ([place])    diagnosed 2004  . Hepatitis    Hep C    Past Surgical History:  Procedure Laterality Date  . BACK SURGERY     two-lumbar disc x2  . COLONOSCOPY WITH PROPOFOL N/A 07/25/2014   DVV:OHYWVP rectal polyp otherwise normal . hyperplastic polyp. TCS in 07/2024  . ESOPHAGEAL DILATION N/A 07/25/2014   Procedure: ESOPHAGEAL DILATION;  Surgeon: Daneil Dolin, MD;  Location: [facility];  Service: Endoscopy;  Laterality: N/A;  Malony dilators # 56,  . ESOPHAGOGASTRODUODENOSCOPY (EGD) WITH PROPOFOL N/A 07/25/2014   XTG:GYIR erosive reflux/s/p dilator/HH. +hpylor     There were no vitals filed for this visit.      Subjective Assessment - 12/09/16 1527    Subjective Pt reports compliance wiht HEP daily without questions.  Reoprts he continues to have pain Lt knee pain scale 7/10.   Patient Stated Goals stop hurting   Currently in Pain? Yes   Pain Score 7    Pain Location Knee   Pain Orientation Left   Pain Descriptors / Indicators Aching;Sore   Pain Type Chronic pain   Pain Onset More than a month ago   Pain  Frequency Intermittent   Aggravating Factors  sitting, standing, walking, bending it   Pain Relieving Factors nothing   Effect of Pain on Daily Activities increases            [facility] PT Assessment - 12/09/16 0001      Assessment   Medical Diagnosis Rupture of anterior cruciate ligament of left knee, subsequent encounter   Referring Provider Arther Abbott, MD   Onset Date/Surgical Date --  6 months ago   Next MD Visit 12/13/16   Prior Therapy none                     [facility] Adult PT Treatment/Exercise - 12/09/16 0001      Exercises   Exercises Knee/Hip     Knee/Hip Exercises: Stretches   Active Hamstring Stretch Left;3 reps;30 seconds   Active Hamstring Stretch Limitations supine with sheet     Knee/Hip Exercises: Supine   Quad Sets 10 reps   Quad Sets Limitations tactile/verbal cueing to improve contraction 3" holds   Heel Slides 10 reps   Straight Leg Raises 10 reps   Straight Leg Raises Limitations cueing for quad set prior SLR to reduce extension lag     Knee/Hip Exercises: Sidelying   Hip ABduction 10 reps   Hip ABduction Limitations cueing for form  Hip ADduction 5 reps   Hip ADduction Limitations cueing for form     Knee/Hip Exercises: Prone   Hamstring Curl 10 reps   Hip Extension 5 reps;2 sets                PT Education - 12/09/16 1533    Education provided Yes   Education Details Reviewed goals, assured compliance iwht HEP with tips to improve form with stretch and copy of eval given to pt.     Person(s) Educated Patient   Methods Explanation;Demonstration;Handout   Comprehension Verbalized understanding;Returned demonstration;Verbal cues required;Need further instruction          PT Short Term Goals - 12/07/16 1700      PT SHORT TERM GOAL #1   Title Pt will be independent with HEP and perform consistently in order to promote return to PLOF.   Time 3   Period Weeks   Status New   Target Date 12/28/16     PT SHORT  TERM GOAL #2   Title Pt will report a decreased overall pain rating to 5/10 or < on a daily basis to allow him to perform ADLs and IADLs with greater ease.    Time 3   Period Weeks   Status New     PT SHORT TERM GOAL #3   Title Pt will be able to perform L SLS for at least 30 sec to be symmetrical with RLE to maximize gait.    Time 3   Period Weeks   Status New     PT SHORT TERM GOAL #4   Title Pt will report being able to sleep through the night with 3 or fewer awakenings due to knee pain in order to maximize his recovery.    Time 3   Period Weeks   Status New           PT Long Term Goals - 12/07/16 1704      PT LONG TERM GOAL #1   Title Pt will be able to ambulate at least 611ft during the 3MWT with min to no gait deviation, 2/10 pain or less, and average gait velocity of at least 0.88m/s or > to maximize pt's community access.   Time 6   Period Weeks   Status New   Target Date 01/18/17     PT LONG TERM GOAL #2   Title Pt will have improved MMT to at least 4+/5 throughout LLE to maximize gait and balance.   Time 6   Period Weeks   Status New     PT LONG TERM GOAL #3   Title Pt will have improved HS flexibility in 90/90 position to at least 150 deg to be symmetrical with RLE in order to decrease pain and maximize gait.   Time 6   Period Weeks   Status New     PT LONG TERM GOAL #4   Title Pt will have decreased overall pain rating of 2/10 or less to promote return to PLOF.    Time 6   Period Weeks   Status New     PT LONG TERM GOAL #5   Title Pt will report being able to walk at least 1 mile, 3x/week to allow him to return to his regular walking program.    Time 6   Period Weeks   Status New               Plan - 12/09/16 1536    Clinical Impression  Statement Reviewed goals, assured compliance iwht HEP with tips to improve form with stretch and copy of eval given to pt.  Session focus on LE strengthening and stretches to improve mobility for pain  control.  Pt limited by pain wiht knee flexion.  Cueing for form with new exercises therex.  EOS with manual STM to hamstrings, noted increased tightness on medial aspect.  Reports of relief following manual.     Rehab Potential Fair   PT Frequency 2x / week   PT Duration 6 weeks   PT Treatment/Interventions ADLs/Self Care Home Management;Cryotherapy;Electrical Stimulation;Moist Heat;Ultrasound;DME Instruction;Gait training;Stair training;Functional mobility training;Therapeutic activities;Therapeutic exercise;Balance training;Neuromuscular re-education;Patient/family education;Manual techniques;Passive range of motion;Dry needling;Energy conservation;Taping   PT Next Visit Plan Manual STM for pain management, quad sets for improved quad contraction, 4-way SLR, LAQ, functional strengthening, rockerboardbalance activities   PT Home Exercise Plan eval: L HS stretch, heel slides for pain improvement      Patient will benefit from skilled therapeutic intervention in order to improve the following deficits and impairments:  Abnormal gait, Decreased activity tolerance, Decreased balance, Decreased endurance, Decreased mobility, Decreased strength, Difficulty walking, Hypomobility, Increased muscle spasms, Impaired flexibility, Improper body mechanics, Pain  Visit Diagnosis: Chronic pain of right knee  Difficulty in walking, not elsewhere classified  Muscle weakness (generalized)  Other symptoms and signs involving the musculoskeletal system     Problem List Patient Active Problem List   Diagnosis Date Noted  . Pneumothorax, traumatic 04/14/2016  . H. pylori infection 08/22/2014  . Reflux esophagitis   . Dysphagia, pharyngoesophageal phase   . Mucosal abnormality of stomach   . History of colonic polyps   . Chronic hepatitis C ([place]) 07/09/2014  . Abnormal weight loss 07/09/2014  . History of ETOH abuse 07/09/2014  . Esophageal dysphagia 07/09/2014  . Odynophagia 07/09/2014  . Abnormal  ECG 05/28/2014   Ihor Austin, LPTA; [place]  Aldona Lento 12/09/2016, 4:16 PM  [facility] [address] [place], [place], [address] Phone: [phone]   Fax:  [phone]  Name: JOVAN COLLIGAN MRN: [id number] Date of Birth: 1955-08-20

## 2016-12-13 ENCOUNTER — Ambulatory Visit (INDEPENDENT_AMBULATORY_CARE_PROVIDER_SITE_OTHER): Payer: Medicare Other | Admitting: Orthopedic Surgery

## 2016-12-13 ENCOUNTER — Encounter: Payer: Self-pay | Admitting: Orthopedic Surgery

## 2016-12-13 VITALS — BP 122/80 | HR 93 | Ht 71.0 in | Wt 177.0 lb

## 2016-12-13 DIAGNOSIS — G8929 Other chronic pain: Secondary | ICD-10-CM

## 2016-12-13 DIAGNOSIS — F17210 Nicotine dependence, cigarettes, uncomplicated: Secondary | ICD-10-CM

## 2016-12-13 DIAGNOSIS — M25562 Pain in left knee: Secondary | ICD-10-CM

## 2016-12-13 DIAGNOSIS — M1712 Unilateral primary osteoarthritis, left knee: Secondary | ICD-10-CM | POA: Diagnosis not present

## 2016-12-13 DIAGNOSIS — S83512D Sprain of anterior cruciate ligament of left knee, subsequent encounter: Secondary | ICD-10-CM | POA: Diagnosis not present

## 2016-12-13 MED ORDER — MELOXICAM 7.5 MG PO TABS: 7.5000 mg | ORAL_TABLET | Freq: Every day | ORAL | 0 refills | Status: DC

## 2016-12-13 NOTE — Progress Notes (Signed)
Progress Note   Patient ID: Bobby York, male   DOB: May 19, 1955, 61 y.o.   MRN: [id number]  Chief Complaint  Patient presents with  . Knee Pain    left/ has gone for PT states pain worse    HPI 61 year old male sent to me by Dr. Luna Glasgow for possible anterior cruciate ligament reconstruction. He has osteoarthritis of his knee. He is not tolerating physical therapy after 2 visits he says the knee hurts worse. He is asking for some hydrocodone for pain.  He did not get an anterior cruciate ligament brace  He says the knee continues to give out and feels unstable Review of Systems  Constitutional: Negative for fever.  Respiratory: Negative for shortness of breath.   Cardiovascular: Negative for chest pain.  Skin: Negative.    Current Meds  Medication Sig  . ALPRAZolam (XANAX) 1 MG tablet Take 1 mg by mouth at bedtime. May take twice daily as needed for anxiety  . mirtazapine (REMERON) 15 MG tablet Take 15 mg by mouth at bedtime.  Marland Kitchen VIAGRA 100 MG tablet Take 100 mg by mouth as needed for erectile dysfunction.     No Known Allergies   BP 122/80   Pulse 93   Ht 5\' 11"  (1.803 m)   Wt 177 lb (80.3 kg)   BMI 24.69 kg/m   Physical Exam   Gen. appearance the patient's appearance is normal with normal grooming and  hygiene The patient is oriented to person place and time Mood and affect are normal  BP 122/80   Pulse 93   Ht 5\' 11"  (1.803 m)   Wt 177 lb (80.3 kg)   BMI 24.69 kg/m  Ortho Exam    Medical decision-making Encounter Diagnoses  Name Primary?  . Rupture of anterior cruciate ligament of left knee, subsequent encounter   . Cigarette nicotine dependence without complication   . Chronic pain of left knee   . Primary osteoarthritis of left knee Yes      Meds ordered this encounter  Medications  . meloxicam (MOBIC) 7.5 MG tablet    Sig: Take 1 tablet (7.5 mg total) by mouth daily.    Dispense:  30 tablet    Refill:  0    MRI IMPRESSION: 1. Chronic  partial tears of the proximal ACL and PCL. The majority of the ACL fibers are torn. 2. Chronic grade 2 sprain of the MCL. 3. Tricompartmental chondromalacia, stable. 4. Stable moderate joint effusion. 5. Diminished tiny Baker's cyst.     Electronically Signed   By: Lorriane Shire M.D.   On: 10/01/2016 09:28  Recommend anterior cruciate ligament bracing and anti-inflammatory medication  Follow-up necessary   Arther Abbott, MD 12/13/2016 2:03 PM

## 2016-12-14 ENCOUNTER — Ambulatory Visit (HOSPITAL_COMMUNITY): Payer: Medicare Other | Admitting: Physical Therapy

## 2016-12-14 DIAGNOSIS — M25561 Pain in right knee: Secondary | ICD-10-CM | POA: Diagnosis not present

## 2016-12-14 DIAGNOSIS — R29898 Other symptoms and signs involving the musculoskeletal system: Secondary | ICD-10-CM

## 2016-12-14 DIAGNOSIS — R262 Difficulty in walking, not elsewhere classified: Secondary | ICD-10-CM

## 2016-12-14 DIAGNOSIS — M6281 Muscle weakness (generalized): Secondary | ICD-10-CM

## 2016-12-14 DIAGNOSIS — G8929 Other chronic pain: Secondary | ICD-10-CM

## 2016-12-14 NOTE — Therapy (Signed)
[place], [place], [address] Phone: [phone]   Fax:  [phone]  Physical Therapy Treatment  Patient Details  Name: Bobby York MRN: [id number] Date of Birth: 11/04/1955 Referring Provider: Arther Abbott, MD  Encounter Date: 12/14/2016      PT End of Session - 12/14/16 1503    Visit Number 3   Number of Visits 13   Date for PT Re-Evaluation 12/28/16   Authorization Type UMR/ [facility] PPO   Authorization Time Period 12/07/16 to 01/18/17   Authorization - Visit Number 3   Authorization - Number of Visits 30   PT Start Time 3785   PT Stop Time 1513   PT Time Calculation (min) 38 min   Activity Tolerance Patient tolerated treatment well;No increased pain   Behavior During Therapy WFL for tasks assessed/performed      Past Medical History:  Diagnosis Date  . Bipolar 1 disorder ([place])    diagnosed 2004  . Hepatitis    Hep C    Past Surgical History:  Procedure Laterality Date  . BACK SURGERY     two-lumbar disc x2  . COLONOSCOPY WITH PROPOFOL N/A 07/25/2014   YIF:OYDXAJ rectal polyp otherwise normal . hyperplastic polyp. TCS in 07/2024  . ESOPHAGEAL DILATION N/A 07/25/2014   Procedure: ESOPHAGEAL DILATION;  Surgeon: Daneil Dolin, MD;  Location: [facility];  Service: Endoscopy;  Laterality: N/A;  Malony dilators # 56,  . ESOPHAGOGASTRODUODENOSCOPY (EGD) WITH PROPOFOL N/A 07/25/2014   OIN:OMVE erosive reflux/s/p dilator/HH. +hpylor     There were no vitals filed for this visit.      Subjective Assessment - 12/14/16 1439    Subjective Pt states he went back to Dr. Aline Brochure yesterday and he gave him pain meds and ordered him a brace.  States he does not want to do surgery at this time.  STates the meds are helping.  States he currently has 7/10 pain but is near time to take another pill.     Currently in Pain? Yes   Pain Score 7    Pain Location Knee   Pain Orientation Left   Pain Descriptors /  Indicators Aching;Sore                         [facility] Adult PT Treatment/Exercise - 12/14/16 0001      Knee/Hip Exercises: Stretches   Active Hamstring Stretch Left;3 reps;30 seconds   Active Hamstring Stretch Limitations supine with sheet     Knee/Hip Exercises: Standing   Heel Raises Both;10 reps   Rocker Board Limitations   Rocker Board Limitations A/P and Rt/Lt with UE assist 10 reps each     Knee/Hip Exercises: Supine   Quad Sets 10 reps   Heel Slides 10 reps   Straight Leg Raises 10 reps   Straight Leg Raises Limitations cueing for quad set prior SLR to reduce extension lag     Knee/Hip Exercises: Sidelying   Hip ABduction 10 reps   Hip ABduction Limitations cueing for form   Hip ADduction 10 reps   Hip ADduction Limitations cueing for form     Knee/Hip Exercises: Prone   Hamstring Curl 10 reps   Hip Extension 10 reps                  PT Short Term Goals - 12/07/16 1700      PT SHORT TERM GOAL #1   Title Pt will  be independent with HEP and perform consistently in order to promote return to PLOF.   Time 3   Period Weeks   Status New   Target Date 12/28/16     PT SHORT TERM GOAL #2   Title Pt will report a decreased overall pain rating to 5/10 or < on a daily basis to allow him to perform ADLs and IADLs with greater ease.    Time 3   Period Weeks   Status New     PT SHORT TERM GOAL #3   Title Pt will be able to perform L SLS for at least 30 sec to be symmetrical with RLE to maximize gait.    Time 3   Period Weeks   Status New     PT SHORT TERM GOAL #4   Title Pt will report being able to sleep through the night with 3 or fewer awakenings due to knee pain in order to maximize his recovery.    Time 3   Period Weeks   Status New           PT Long Term Goals - 12/07/16 1704      PT LONG TERM GOAL #1   Title Pt will be able to ambulate at least 658ft during the 3MWT with min to no gait deviation, 2/10 pain or less, and  average gait velocity of at least 0.4m/s or > to maximize pt's community access.   Time 6   Period Weeks   Status New   Target Date 01/18/17     PT LONG TERM GOAL #2   Title Pt will have improved MMT to at least 4+/5 throughout LLE to maximize gait and balance.   Time 6   Period Weeks   Status New     PT LONG TERM GOAL #3   Title Pt will have improved HS flexibility in 90/90 position to at least 150 deg to be symmetrical with RLE in order to decrease pain and maximize gait.   Time 6   Period Weeks   Status New     PT LONG TERM GOAL #4   Title Pt will have decreased overall pain rating of 2/10 or less to promote return to PLOF.    Time 6   Period Weeks   Status New     PT LONG TERM GOAL #5   Title Pt will report being able to walk at least 1 mile, 3x/week to allow him to return to his regular walking program.    Time 6   Period Weeks   Status New               Plan - 12/14/16 1505    Clinical Impression Statement Pt continues to be in a great deal of pain despite the pain meds. Reports he is doubling up in order to decrease the edge and still not sleeping good.  Continued with therex to strengthen surrounding musculature with some pain voiced.  Cues for proper positioning to isolate correct musculature.  Pt verbalizes he has no return apointment to orthopedist and he has released him.     Rehab Potential Fair   PT Frequency 2x / week   PT Duration 6 weeks   PT Treatment/Interventions ADLs/Self Care Home Management;Cryotherapy;Electrical Stimulation;Moist Heat;Ultrasound;DME Instruction;Gait training;Stair training;Functional mobility training;Therapeutic activities;Therapeutic exercise;Balance training;Neuromuscular re-education;Patient/family education;Manual techniques;Passive range of motion;Dry needling;Energy conservation;Taping   PT Next Visit Plan Manual STM for pain management PRN, continued strengthening of LE.   PT Home Exercise  Plan eval: L HS stretch, heel  slides for pain improvement      Patient will benefit from skilled therapeutic intervention in order to improve the following deficits and impairments:  Abnormal gait, Decreased activity tolerance, Decreased balance, Decreased endurance, Decreased mobility, Decreased strength, Difficulty walking, Hypomobility, Increased muscle spasms, Impaired flexibility, Improper body mechanics, Pain  Visit Diagnosis: Chronic pain of right knee  Difficulty in walking, not elsewhere classified  Muscle weakness (generalized)  Other symptoms and signs involving the musculoskeletal system     Problem List Patient Active Problem List   Diagnosis Date Noted  . Pneumothorax, traumatic 04/14/2016  . H. pylori infection 08/22/2014  . Reflux esophagitis   . Dysphagia, pharyngoesophageal phase   . Mucosal abnormality of stomach   . History of colonic polyps   . Chronic hepatitis C ([place]) 07/09/2014  . Abnormal weight loss 07/09/2014  . History of ETOH abuse 07/09/2014  . Esophageal dysphagia 07/09/2014  . Odynophagia 07/09/2014  . Abnormal ECG 05/28/2014   Teena Irani, PTA/CLT [phone] ] Mare Ferrari, Amy B 12/14/2016, 3:15 PM  [facility] [address] [place], [place], [address] Phone: [phone]   Fax:  [phone]  Name: Bobby York MRN: [id number] Date of Birth: 02-05-1956

## 2016-12-17 ENCOUNTER — Telehealth (HOSPITAL_COMMUNITY): Payer: Self-pay

## 2016-12-17 ENCOUNTER — Ambulatory Visit (HOSPITAL_COMMUNITY): Payer: Medicare Other

## 2016-12-17 NOTE — Telephone Encounter (Signed)
No show #1; Left message re missed appointment. Reminded pt of next scheduled visit and asked him to call the front office if he needed to cancel.   Geraldine Solar PT, DPT

## 2016-12-21 ENCOUNTER — Ambulatory Visit (HOSPITAL_COMMUNITY): Payer: Medicare Other | Admitting: Physical Therapy

## 2016-12-21 ENCOUNTER — Telehealth: Payer: Self-pay | Admitting: Orthopedic Surgery

## 2016-12-21 DIAGNOSIS — R262 Difficulty in walking, not elsewhere classified: Secondary | ICD-10-CM | POA: Diagnosis not present

## 2016-12-21 DIAGNOSIS — M25561 Pain in right knee: Principal | ICD-10-CM

## 2016-12-21 DIAGNOSIS — M6281 Muscle weakness (generalized): Secondary | ICD-10-CM | POA: Diagnosis not present

## 2016-12-21 DIAGNOSIS — G8929 Other chronic pain: Secondary | ICD-10-CM | POA: Diagnosis not present

## 2016-12-21 DIAGNOSIS — R29898 Other symptoms and signs involving the musculoskeletal system: Secondary | ICD-10-CM

## 2016-12-21 NOTE — Telephone Encounter (Signed)
Patient stopped by our office, requests to have his records forwarded to an orthopaedist in [place]. Relayed protocol of signing authorization/release of information.  Patient brought up that he did not come to his last appointment.  Offered to re-schedule, which patient declined at this time. Patient signed release, our office faxed records directly to requested [phone].  Patient aware.

## 2016-12-21 NOTE — Therapy (Signed)
[place], [place], [address] Phone: [phone]   Fax:  [phone]  Physical Therapy Treatment  Patient Details  Name: Bobby York MRN: [id number] Date of Birth: 1955/11/13 Referring Provider: Arther Abbott, MD  Encounter Date: 12/21/2016      PT End of Session - 12/21/16 1604    Visit Number 4   Number of Visits 13   Date for PT Re-Evaluation 12/28/16   Authorization Type UMR/ [facility] PPO   Authorization Time Period 12/07/16 to 01/18/17   Authorization - Visit Number 4   Authorization - Number of Visits 30   PT Start Time 3149   PT Stop Time 1510   PT Time Calculation (min) 34 min   Activity Tolerance Patient tolerated treatment well;No increased pain   Behavior During Therapy WFL for tasks assessed/performed      Past Medical History:  Diagnosis Date  . Bipolar 1 disorder ([place])    diagnosed 2004  . Hepatitis    Hep C    Past Surgical History:  Procedure Laterality Date  . BACK SURGERY     two-lumbar disc x2  . COLONOSCOPY WITH PROPOFOL N/A 07/25/2014   FWY:OVZCHY rectal polyp otherwise normal . hyperplastic polyp. TCS in 07/2024  . ESOPHAGEAL DILATION N/A 07/25/2014   Procedure: ESOPHAGEAL DILATION;  Surgeon: Daneil Dolin, MD;  Location: [facility];  Service: Endoscopy;  Laterality: N/A;  Malony dilators # 56,  . ESOPHAGOGASTRODUODENOSCOPY (EGD) WITH PROPOFOL N/A 07/25/2014   IFO:YDXA erosive reflux/s/p dilator/HH. +hpylor     There were no vitals filed for this visit.      Subjective Assessment - 12/21/16 1446    Subjective Pt states he is doing the same today.  Pain remains at 6/10.  States he is thinking of getting a second opinion on his knee.   Currently in Pain? Yes   Pain Score 6    Pain Location Knee   Pain Orientation Left   Pain Descriptors / Indicators Aching;Sore   Pain Type Chronic pain                         [facility] Adult PT Treatment/Exercise - 12/21/16  0001      Knee/Hip Exercises: Stretches   Active Hamstring Stretch Left;30 seconds;2 reps   Active Hamstring Stretch Limitations standing on 12" box     Knee/Hip Exercises: Standing   Heel Raises Both;10 reps   Heel Raises Limitations toeraises 10 reps   Knee Flexion Left;10 reps   Rocker Board Limitations   Rocker Board Limitations A/P and Rt/Lt with UE assist 10 reps each     Knee/Hip Exercises: Supine   Heel Slides 10 reps   Straight Leg Raises 10 reps;2 sets   Straight Leg Raises Limitations cueing for quad set prior SLR to reduce extension lag     Knee/Hip Exercises: Sidelying   Hip ABduction 10 reps;2 sets   Hip ABduction Limitations cueing for form   Hip ADduction 10 reps;2 sets   Hip ADduction Limitations cueing for form     Knee/Hip Exercises: Prone   Hamstring Curl 10 reps   Hip Extension 10 reps                  PT Short Term Goals - 12/07/16 1700      PT SHORT TERM GOAL #1   Title Pt will be independent with HEP and perform consistently in order to promote  return to PLOF.   Time 3   Period Weeks   Status New   Target Date 12/28/16     PT SHORT TERM GOAL #2   Title Pt will report a decreased overall pain rating to 5/10 or < on a daily basis to allow him to perform ADLs and IADLs with greater ease.    Time 3   Period Weeks   Status New     PT SHORT TERM GOAL #3   Title Pt will be able to perform L SLS for at least 30 sec to be symmetrical with RLE to maximize gait.    Time 3   Period Weeks   Status New     PT SHORT TERM GOAL #4   Title Pt will report being able to sleep through the night with 3 or fewer awakenings due to knee pain in order to maximize his recovery.    Time 3   Period Weeks   Status New           PT Long Term Goals - 12/07/16 1704      PT LONG TERM GOAL #1   Title Pt will be able to ambulate at least 659ft during the 3MWT with min to no gait deviation, 2/10 pain or less, and average gait velocity of at least 0.21m/s  or > to maximize pt's community access.   Time 6   Period Weeks   Status New   Target Date 01/18/17     PT LONG TERM GOAL #2   Title Pt will have improved MMT to at least 4+/5 throughout LLE to maximize gait and balance.   Time 6   Period Weeks   Status New     PT LONG TERM GOAL #3   Title Pt will have improved HS flexibility in 90/90 position to at least 150 deg to be symmetrical with RLE in order to decrease pain and maximize gait.   Time 6   Period Weeks   Status New     PT LONG TERM GOAL #4   Title Pt will have decreased overall pain rating of 2/10 or less to promote return to PLOF.    Time 6   Period Weeks   Status New     PT LONG TERM GOAL #5   Title Pt will report being able to walk at least 1 mile, 3x/week to allow him to return to his regular walking program.    Time 6   Period Weeks   Status New               Plan - 12/21/16 1608    Clinical Impression Statement Pt more tolerable of exercises this session without c/o.  Added additional set and began additional standing exericses.  Pt c/o popping and pain with lateral rockerboard, unable to complete all reps.  Attempted manual to knee at end of session, however pateint refused sttaing it was too tender to touch.  REocmmended he continue icing to help with pain and be compliant with HEP.   Rehab Potential Fair   PT Frequency 2x / week   PT Duration 6 weeks   PT Treatment/Interventions ADLs/Self Care Home Management;Cryotherapy;Electrical Stimulation;Moist Heat;Ultrasound;DME Instruction;Gait training;Stair training;Functional mobility training;Therapeutic activities;Therapeutic exercise;Balance training;Neuromuscular re-education;Patient/family education;Manual techniques;Passive range of motion;Dry needling;Energy conservation;Taping   PT Next Visit Plan Manual STM for pain management PRN, continued strengthening of LE.   PT Home Exercise Plan eval: L HS stretch, heel slides for pain improvement  Patient  will benefit from skilled therapeutic intervention in order to improve the following deficits and impairments:  Abnormal gait, Decreased activity tolerance, Decreased balance, Decreased endurance, Decreased mobility, Decreased strength, Difficulty walking, Hypomobility, Increased muscle spasms, Impaired flexibility, Improper body mechanics, Pain  Visit Diagnosis: Chronic pain of right knee  Difficulty in walking, not elsewhere classified  Muscle weakness (generalized)  Other symptoms and signs involving the musculoskeletal system     Problem List Patient Active Problem List   Diagnosis Date Noted  . Pneumothorax, traumatic 04/14/2016  . H. pylori infection 08/22/2014  . Reflux esophagitis   . Dysphagia, pharyngoesophageal phase   . Mucosal abnormality of stomach   . History of colonic polyps   . Chronic hepatitis C ([place]) 07/09/2014  . Abnormal weight loss 07/09/2014  . History of ETOH abuse 07/09/2014  . Esophageal dysphagia 07/09/2014  . Odynophagia 07/09/2014  . Abnormal ECG 05/28/2014   Teena Irani, PTA/CLT [phone]  Teena Irani 12/21/2016, 4:11 PM  [place] [place] [address] [place], [place], [address] Phone: [phone]   Fax:  [phone]  Name: Bobby York MRN: [id number] Date of Birth: [birth year]

## 2016-12-24 ENCOUNTER — Telehealth (HOSPITAL_COMMUNITY): Payer: Self-pay

## 2016-12-24 ENCOUNTER — Ambulatory Visit (HOSPITAL_COMMUNITY): Payer: Medicare Other

## 2016-12-24 NOTE — Telephone Encounter (Signed)
He came in early at 1:24pm trying to be seen before the rain, pt didnot want to wait on the 1:45 to see if someone would NO SHOW

## 2016-12-28 ENCOUNTER — Ambulatory Visit (HOSPITAL_COMMUNITY): Payer: Medicare Other | Attending: Orthopedic Surgery

## 2016-12-28 ENCOUNTER — Encounter (HOSPITAL_COMMUNITY): Payer: Self-pay

## 2016-12-28 DIAGNOSIS — R29898 Other symptoms and signs involving the musculoskeletal system: Secondary | ICD-10-CM

## 2016-12-28 DIAGNOSIS — M25561 Pain in right knee: Secondary | ICD-10-CM | POA: Insufficient documentation

## 2016-12-28 DIAGNOSIS — G8929 Other chronic pain: Secondary | ICD-10-CM

## 2016-12-28 DIAGNOSIS — R262 Difficulty in walking, not elsewhere classified: Secondary | ICD-10-CM

## 2016-12-28 DIAGNOSIS — M6281 Muscle weakness (generalized): Secondary | ICD-10-CM | POA: Diagnosis not present

## 2016-12-28 NOTE — Therapy (Signed)
[place], [place], [address] Phone: [phone]   Fax:  [phone]  Physical Therapy Treatment/Reassessment  Patient Details  Name: Bobby York MRN: [id number] Date of Birth: December 04, 1955 Referring Provider: Arther Abbott, MD   Encounter Date: 12/28/2016  PT End of Session - 12/28/16 1513    Visit Number  5    Number of Visits  13    Date for PT Re-Evaluation  01/18/17    Authorization Type  [employer]/ [facility] PPO     Authorization Time Period  12/07/16 to 01/18/17    Authorization - Visit Number  5    Authorization - Number of Visits  30    PT Start Time  2951    PT Stop Time  8841    PT Time Calculation (min)  43 min    Activity Tolerance  Patient tolerated treatment well;No increased pain    Behavior During Therapy  WFL for tasks assessed/performed       Past Medical History:  Diagnosis Date  . Bipolar 1 disorder ([place])    diagnosed 2004  . Hepatitis    Hep C    Past Surgical History:  Procedure Laterality Date  . BACK SURGERY     two-lumbar disc x2    There were no vitals filed for this visit.  Subjective Assessment - 12/28/16 1519    Subjective  Pt states that his knee pain is about 2-3/10 this date. He states that he is still going to get a second opinion, he is just waiting for that office to get in touch with him to set up an appointment.    Currently in Pain?  Yes    Pain Score  3     Pain Location  Knee    Pain Orientation  Left    Pain Descriptors / Indicators  Aching    Pain Type  Chronic pain    Pain Onset  More than a month ago    Pain Frequency  Intermittent    Aggravating Factors   sitting, standing, walking, bending it    Pain Relieving Factors  nothing    Effect of Pain on Daily Activities  increases           [facility] PT Assessment - 12/28/16 0001      Strength   Right Hip Extension  4/5 was 4   was 4   Left Hip Extension  4/5 was 4-   was 4-   Left Hip ABduction   4+/5 was 4   was 4   Left Knee Flexion  4+/5 painful; was 3+   painful; was 3+   Left Knee Extension  5/5 "tiny bit" of pain this time; was painful   "tiny bit" of pain this time; was painful     Flexibility   Hamstrings  90/90: L:       Ambulation/Gait   Ambulation Distance (Feet)  578 Feet 3MWT; was 356f   3MWT; was 3678f  Assistive device  None    Gait velocity  0.9859mwas 0.39m65m was 0.39m/32mGait Comments  increased L knee pain to 3/10      Balance   Balance Assessed  Yes      Static Standing Balance   Static Standing - Balance Support  No upper extremity supported    Static Standing Balance -  Activities   Single Leg Stance - Right Leg;Single Leg Stance - Left Leg  Static Standing - Comment/# of Minutes  L: 20 sec was 8 sec on L   was 8 sec on L            [facility] Adult PT Treatment/Exercise - 12/28/16 0001      Manual Therapy   Manual Therapy  Soft tissue mobilization    Manual therapy comments  completed separate rest of treatment    Soft tissue mobilization  STM to L distal quad, medial/lateral joint line, and medial/lateral HS              PT Education - 12/28/16 1514    Education provided  Yes    Education Details  reassessment findings, POC, continue HEP    Person(s) Educated  Patient    Methods  Explanation    Comprehension  Verbalized understanding       PT Short Term Goals - 12/28/16 1523      PT SHORT TERM GOAL #1   Title  Pt will be independent with HEP and perform consistently in order to promote return to PLOF.    Time  3    Period  Weeks    Status  Achieved      PT SHORT TERM GOAL #2   Title  Pt will report a decreased overall pain rating to 5/10 or < on a daily basis to allow him to perform ADLs and IADLs with greater ease.     Baseline  11/7: average daily pain is 5/10, over the last week it hasn't really hurt him at all.    Time  3    Period  Weeks    Status  Achieved      PT SHORT TERM GOAL #3   Title  Pt will be  able to perform L SLS for at least 30 sec to be symmetrical with RLE to maximize gait.     Baseline  11/6: 20 sec on LLE    Time  3    Period  Weeks    Status  Partially Met      PT SHORT TERM GOAL #4   Title  Pt will report being able to sleep through the night with 3 or fewer awakenings due to knee pain in order to maximize his recovery.     Baseline  11/7: He states that he is sleeping better and his knee pain isn't as bad, but it will intermittently wake him up    Time  3    Period  Weeks    Status  Partially Met        PT Long Term Goals - 12/28/16 1525      PT LONG TERM GOAL #1   Title  Pt will be able to ambulate at least 640f during the 3MWT with min to no gait deviation, 2/10 pain or less, and average gait velocity of at least 0.76m or > to maximize pt's community access.    Baseline  11/6: 57881f3/10 knee pain, 0.60m31m  Time  6    Period  Weeks    Status  On-going      PT LONG TERM GOAL #2   Title  Pt will have improved MMT to at least 4+/5 throughout LLE to maximize gait and balance.    Time  6    Period  Weeks    Status  Partially Met      PT LONG TERM GOAL #3   Title  Pt will have improved HS flexibility in  90/90 position to at least 150 deg to be symmetrical with RLE in order to decrease pain and maximize gait.    Time  6    Period  Weeks    Status  Achieved      PT LONG TERM GOAL #4   Title  Pt will have decreased overall pain rating of 2/10 or less to promote return to PLOF.     Baseline  11/7: his pain the last few weeks have been averaging around 4-5/10    Time  6    Period  Weeks    Status  On-going      PT LONG TERM GOAL #5   Title  Pt will report being able to walk at least 1 mile, 3x/week to allow him to return to his regular walking program.     Baseline  11/7: hasn't tried to get back to his regular walking program since therapy, but otherwise he hasn't really tried walking; can walk about 1/4 a mile    Time  6    Period  Weeks    Status   On-going            Plan - 12/28/16 1606    Clinical Impression Statement  PT reassessed pt's goals and outcome measures this date. Pt has made great progress towards all of his goals as illustrated above. He is still deficient in his SLS, hip ext MMT, 3MWT, and his pain is still increased. Pt does report feeling 80% improved since starting therapy reporting that his pain has decreased since starting, movement has gotten easier, and his walking and balance are better. Ended session with manual therapy to L knee since pt reported that his knee pain had increased following reassessment. Pt reported 1/10 pain at EOS following manual. POC will be continued as planned for remainder of cert.    Rehab Potential  Fair    PT Frequency  2x / week    PT Duration  6 weeks    PT Treatment/Interventions  ADLs/Self Care Home Management;Cryotherapy;Electrical Stimulation;Moist Heat;Ultrasound;DME Instruction;Gait training;Stair training;Functional mobility training;Therapeutic activities;Therapeutic exercise;Balance training;Neuromuscular re-education;Patient/family education;Manual techniques;Passive range of motion;Dry needling;Energy conservation;Taping    PT Next Visit Plan  Manual STM for pain management PRN, continue generalized strengthening and progress to functional strengthening of LE as tolerated; update HEP next visit;     PT Home Exercise Plan  eval: L HS stretch, heel slides for pain improvement    Consulted and Agree with Plan of Care  Patient       Patient will benefit from skilled therapeutic intervention in order to improve the following deficits and impairments:  Abnormal gait, Decreased activity tolerance, Decreased balance, Decreased endurance, Decreased mobility, Decreased strength, Difficulty walking, Hypomobility, Increased muscle spasms, Impaired flexibility, Improper body mechanics, Pain  Visit Diagnosis: Chronic pain of right knee  Difficulty in walking, not elsewhere  classified  Muscle weakness (generalized)  Other symptoms and signs involving the musculoskeletal system     Problem List Patient Active Problem List   Diagnosis Date Noted  . Pneumothorax, traumatic 04/14/2016  . H. pylori infection 08/22/2014  . Reflux esophagitis   . Dysphagia, pharyngoesophageal phase   . Mucosal abnormality of stomach   . History of colonic polyps   . Chronic hepatitis C ([place]) 07/09/2014  . Abnormal weight loss 07/09/2014  . History of ETOH abuse 07/09/2014  . Esophageal dysphagia 07/09/2014  . Odynophagia 07/09/2014  . Abnormal ECG 05/28/2014        Geraldine Solar  PT, DPT  [facility] [address] [place], [place], [address] Phone: [phone]   Fax:  [phone]  Name: Bobby York MRN: [id number] Date of Birth: January 28, 1956

## 2016-12-30 ENCOUNTER — Telehealth (HOSPITAL_COMMUNITY): Payer: Self-pay

## 2016-12-30 ENCOUNTER — Ambulatory Visit (HOSPITAL_COMMUNITY): Payer: Medicare Other

## 2016-12-30 NOTE — Telephone Encounter (Signed)
Patient no-showed for appointment today at 3:15 PM. Called Patient and left a voicemail to check in with ow he is doing. Reminded him of his next upcoming appointment date, 01/04/17 Tuesday at 2:30 PM.  Debara Pickett, PT, DPT Physical Therapist with [facility]  12/30/2016 4:04 PM

## 2016-12-31 ENCOUNTER — Ambulatory Visit (HOSPITAL_COMMUNITY): Payer: Medicare Other | Admitting: Physical Therapy

## 2016-12-31 ENCOUNTER — Other Ambulatory Visit: Payer: Self-pay

## 2016-12-31 ENCOUNTER — Encounter (HOSPITAL_COMMUNITY): Payer: Self-pay | Admitting: Physical Therapy

## 2016-12-31 DIAGNOSIS — R262 Difficulty in walking, not elsewhere classified: Secondary | ICD-10-CM

## 2016-12-31 DIAGNOSIS — G8929 Other chronic pain: Secondary | ICD-10-CM | POA: Diagnosis not present

## 2016-12-31 DIAGNOSIS — R29898 Other symptoms and signs involving the musculoskeletal system: Secondary | ICD-10-CM | POA: Diagnosis not present

## 2016-12-31 DIAGNOSIS — M25561 Pain in right knee: Principal | ICD-10-CM

## 2016-12-31 DIAGNOSIS — M6281 Muscle weakness (generalized): Secondary | ICD-10-CM

## 2016-12-31 NOTE — Patient Instructions (Addendum)
Functional Quadriceps: Chair Squat    Keeping feet flat on floor, shoulder width apart, squat as low as is comfortable. Use support as necessary. Repeat ____ times per set. Do ____ sets per session. Do ____ sessions per day.  http://orth.exer.us/736   Copyright  VHI. All rights reserved.  Heel Raise: Bilateral (Standing)   At kitchen counter. Rise on balls of feet. Repeat __10-15__ times per set. Do ___1_ sets per session. Do ___2_ sessions per day.  http://orth.exer.us/38   Copyright  VHI. All rights reserved.  Strengthening: Quadriceps Set    Tighten muscles on top of thighs by pushing knees down into surface. Hold __3__ seconds. Repeat __10-15__ times per set. Do _1___ sets per session. Do __2__ sessions per day.  http://orth.exer.us/602   Copyright  VHI. All rights reserved.  Self-Mobilization: Knee Flexion (Prone)   With 3-5 #  Bring left heel toward buttocks as close as possible. Hold ___3_ seconds. Relax. Repeat _10___ times per set. Do ___1_ sets per session. Do _2___ sessions per day.  http://orth.exer.us/596   Copyright  VHI. All rights reserved.  Strengthening: Hip Extension (Prone)    Tighten muscles on front of left thigh, then lift leg __2__ inches from surface, keeping knee locked. Repeat 10____ times per set. Do __1__ sets per session. Do ___2_ sessions per day.  http://orth.exer.us/620   Copyright  VHI. All rights reserved.

## 2016-12-31 NOTE — Therapy (Signed)
[place], [place], [address] Phone: [phone]   Fax:  [phone]  Physical Therapy Treatment  Patient Details  Name: Bobby York MRN: [id number] Date of Birth: 09/30/1955 Referring Provider: Arther Abbott, MD   Encounter Date: 12/31/2016  PT End of Session - 12/31/16 1535    Visit Number  6    Number of Visits  13    Date for PT Re-Evaluation  01/18/17    Authorization Type  [employer]/ [employer] [facility] PPO     Authorization Time Period  12/07/16 to 01/18/17    Authorization - Visit Number  6    Authorization - Number of Visits  30    PT Start Time  1500    PT Stop Time  4888    PT Time Calculation (min)  42 min    Activity Tolerance  Patient tolerated treatment well;No increased pain    Behavior During Therapy  WFL for tasks assessed/performed       Past Medical History:  Diagnosis Date  . Bipolar 1 disorder ([place])    diagnosed 2004  . Hepatitis    Hep C    Past Surgical History:  Procedure Laterality Date  . BACK SURGERY     two-lumbar disc x2    There were no vitals filed for this visit.  Subjective Assessment - 12/31/16 1505    Subjective  Bobby York states that his knee is feeling great today.   Pt is not having trouble doing anything.      Currently in Pain?  No/denies    Pain Onset  More than a month ago                      [facility] Adult PT Treatment/Exercise - 12/31/16 0001      Exercises   Exercises  Knee/Hip      Knee/Hip Exercises: Stretches   Active Hamstring Stretch  Left;3 reps;30 seconds      Knee/Hip Exercises: Standing   Heel Raises  Both;15 reps    Forward Lunges  Left;10 reps    Lateral Step Up  Left;10 reps;Step Height: 6"    Forward Step Up  Left;10 reps;Step Height: 6"    Functional Squat  10 reps    Rocker Board  2 minutes    SLS  B 1 x x 60 seconds       Knee/Hip Exercises: Seated   Sit to [employer]  10 reps      Knee/Hip Exercises: Supine   Quad Sets   Left;10 reps    Short Arc Quad Sets  Left;10 reps    Short Arc Quad Sets Limitations  3#    Straight Leg Raises  Left;10 reps    Straight Leg Raises Limitations  --      Knee/Hip Exercises: Sidelying   Hip ABduction  Left;15 reps    Hip ABduction Limitations  3#      Knee/Hip Exercises: Prone   Hamstring Curl  10 reps    Hamstring Curl Limitations  5 #    Hip Extension  15 reps    Other Prone Exercises  terminal extension x 10                PT Short Term Goals - 12/28/16 1523      PT SHORT TERM GOAL #1   Title  Pt will be independent with HEP and perform consistently in order to promote return to PLOF.  Time  3    Period  Weeks    Status  Achieved      PT SHORT TERM GOAL #2   Title  Pt will report a decreased overall pain rating to 5/10 or < on a daily basis to allow him to perform ADLs and IADLs with greater ease.     Baseline  11/7: average daily pain is 5/10, over the last week it hasn't really hurt him at all.    Time  3    Period  Weeks    Status  Achieved      PT SHORT TERM GOAL #3   Title  Pt will be able to perform L SLS for at least 30 sec to be symmetrical with RLE to maximize gait.     Baseline  11/6: 20 sec on LLE    Time  3    Period  Weeks    Status  Partially Met      PT SHORT TERM GOAL #4   Title  Pt will report being able to sleep through the night with 3 or fewer awakenings due to knee pain in order to maximize his recovery.     Baseline  11/7: He states that he is sleeping better and his knee pain isn't as bad, but it will intermittently wake him up    Time  3    Period  Weeks    Status  Partially Met        PT Long Term Goals - 12/28/16 1525      PT LONG TERM GOAL #1   Title  Pt will be able to ambulate at least 657f during the 3MWT with min to no gait deviation, 2/10 pain or less, and average gait velocity of at least 0.761m or > to maximize pt's community access.    Baseline  11/6: 57823f3/10 knee pain, 0.103m107m  Time  6     Period  Weeks    Status  On-going      PT LONG TERM GOAL #2   Title  Pt will have improved MMT to at least 4+/5 throughout LLE to maximize gait and balance.    Time  6    Period  Weeks    Status  Partially Met      PT LONG TERM GOAL #3   Title  Pt will have improved HS flexibility in 90/90 position to at least 150 deg to be symmetrical with RLE in order to decrease pain and maximize gait.    Time  6    Period  Weeks    Status  Achieved      PT LONG TERM GOAL #4   Title  Pt will have decreased overall pain rating of 2/10 or less to promote return to PLOF.     Baseline  11/7: his pain the last few weeks have been averaging around 4-5/10    Time  6    Period  Weeks    Status  On-going      PT LONG TERM GOAL #5   Title  Pt will report being able to walk at least 1 mile, 3x/week to allow him to return to his regular walking program.     Baseline  11/7: hasn't tried to get back to his regular walking program since therapy, but otherwise he hasn't really tried walking; can walk about 1/4 a mile    Time  6    Period  Weeks  Status  On-going            Plan - 12/31/16 1537    Clinical Impression Statement  Added several weight bearing exercises to promote strengthening. PT needed minimal verbal cuing to complete exercises with proper technique.  HEP updated  Pt appears to be improving with functional tolerance.    Rehab Potential  Fair    PT Frequency  2x / week    PT Duration  6 weeks    PT Treatment/Interventions  ADLs/Self Care Home Management;Cryotherapy;Electrical Stimulation;Moist Heat;Ultrasound;DME Instruction;Gait training;Stair training;Functional mobility training;Therapeutic activities;Therapeutic exercise;Balance training;Neuromuscular re-education;Patient/family education;Manual techniques;Passive range of motion;Dry needling;Energy conservation;Taping    PT Next Visit Plan  Manual STM for pain management PRN, continue generalized strengthening and progress to  functional strengthening of LE as tolerated; update HEP next visit;     PT Home Exercise Plan  eval: L HS stretch, heel slides for pain improvement 11/9 heel raises; functional squat, Q-set; prone ham curl; prone hip extension.    Consulted and Agree with Plan of Care  Patient       Patient will benefit from skilled therapeutic intervention in order to improve the following deficits and impairments:  Abnormal gait, Decreased activity tolerance, Decreased balance, Decreased endurance, Decreased mobility, Decreased strength, Difficulty walking, Hypomobility, Increased muscle spasms, Impaired flexibility, Improper body mechanics, Pain  Visit Diagnosis: Chronic pain of right knee  Difficulty in walking, not elsewhere classified  Muscle weakness (generalized)  Other symptoms and signs involving the musculoskeletal system     Problem List Patient Active Problem List   Diagnosis Date Noted  . Pneumothorax, traumatic 04/14/2016  . H. pylori infection 08/22/2014  . Reflux esophagitis   . Dysphagia, pharyngoesophageal phase   . Mucosal abnormality of stomach   . History of colonic polyps   . Chronic hepatitis C ([place]) 07/09/2014  . Abnormal weight loss 07/09/2014  . History of ETOH abuse 07/09/2014  . Esophageal dysphagia 07/09/2014  . Odynophagia 07/09/2014  . Abnormal ECG 05/28/2014    Rayetta Humphrey, PT CLT [phone] 12/31/2016, 3:45 PM  [place] [address] [place], [place], [address] Phone: [phone]   Fax:  [phone]  Name: Bobby York MRN: [id number] Date of Birth: [birth year]

## 2017-01-04 ENCOUNTER — Ambulatory Visit (HOSPITAL_COMMUNITY): Payer: Medicare Other

## 2017-01-04 ENCOUNTER — Ambulatory Visit: Payer: Medicare Other | Admitting: Orthopedic Surgery

## 2017-01-04 ENCOUNTER — Encounter (HOSPITAL_COMMUNITY): Payer: Self-pay

## 2017-01-04 DIAGNOSIS — M25561 Pain in right knee: Secondary | ICD-10-CM | POA: Diagnosis not present

## 2017-01-04 DIAGNOSIS — R262 Difficulty in walking, not elsewhere classified: Secondary | ICD-10-CM

## 2017-01-04 DIAGNOSIS — G8929 Other chronic pain: Secondary | ICD-10-CM | POA: Diagnosis not present

## 2017-01-04 DIAGNOSIS — R29898 Other symptoms and signs involving the musculoskeletal system: Secondary | ICD-10-CM | POA: Diagnosis not present

## 2017-01-04 DIAGNOSIS — M6281 Muscle weakness (generalized): Secondary | ICD-10-CM | POA: Diagnosis not present

## 2017-01-04 NOTE — Patient Instructions (Signed)
  FRONT STEP-UPS  Step up onto stool or step with involved leg.  Step down leading with uninvolved leg.  Repeat.   LATERAL STEP-UPS  Stand on stool or step with involved leg.  Lower the uninvolved leg until the heel touches the floor, then press back up using the muscles in the involved leg only.  Repeat.   WALL SQUATS  Leaning up against a wall or closed door on your back, slide your body downward and then return back to upright position.  A door was used here because it was smoother and had less friction than the wall.   Knees should bend in line with the 2nd toe and not pass the front of the foot.   SINGLE LEG STANCE - SLS  Stand on one leg and maintain your balance.   SINGLE LEG STANCE - FORWARD SLS  Stand on one leg and maintain your balance.   Next, hold your leg out in front of your body.   Then return to original position. Then hold your leg out to the side, return to the original position, then hold your leg behind you  Maintain a slightly bent knee on the stance side.

## 2017-01-04 NOTE — Therapy (Signed)
[place], [place], [address] Phone: [phone]   Fax:  [phone]  Physical Therapy Treatment  Patient Details  Name: Bobby York MRN: [id number] Date of Birth: 12-Mar-1955 Referring Provider: Arther Abbott, MD   Encounter Date: 01/04/2017  PT End of Session - 01/04/17 1429    Visit Number  7    Number of Visits  13    Date for PT Re-Evaluation  01/18/17    Authorization Type  [facility]/ [facility] PPO     Authorization Time Period  12/07/16 to 01/18/17    Authorization - Visit Number  7    Authorization - Number of Visits  30    PT Start Time  1430    Activity Tolerance  Patient tolerated treatment well;No increased pain    Behavior During Therapy  WFL for tasks assessed/performed       Past Medical History:  Diagnosis Date  . Bipolar 1 disorder ([place])    diagnosed 2004  . Hepatitis    Hep C    Past Surgical History:  Procedure Laterality Date  . BACK SURGERY     two-lumbar disc x2    There were no vitals filed for this visit.  Subjective Assessment - 01/04/17 1429    Subjective  Pt states that his L knee feels wonderful today. He was just a little sore following last session.     Currently in Pain?  No/denies    Pain Onset  More than a month ago         [facility] PT Assessment - 01/04/17 0001      Strength   Right Hip Extension  4/5 was 4    Left Hip Extension  4/5 was 4-    Left Hip ABduction  4+/5 was 4    Left Knee Flexion  4+/5 painful; was 3+    Left Knee Extension  5/5 "tiny bit" of pain this time; was painful            PT Education - 01/04/17 1429    Education provided  Yes    Education Details  discharge plans, updated HEP    Person(s) Educated  Patient    Methods  Explanation;Handout    Comprehension  Verbalized understanding       PT Short Term Goals - 01/04/17 1434      PT SHORT TERM GOAL #1   Title  Pt will be independent with HEP and perform consistently in order  to promote return to PLOF.    Time  3    Period  Weeks    Status  Achieved      PT SHORT TERM GOAL #2   Title  Pt will report a decreased overall pain rating to 5/10 or < on a daily basis to allow him to perform ADLs and IADLs with greater ease.     Baseline  11/13: pt has had 0/10 pain fo the last week    Time  3    Period  Weeks    Status  Achieved      PT SHORT TERM GOAL #3   Title  Pt will be able to perform L SLS for at least 30 sec to be symmetrical with RLE to maximize gait.     Baseline  11/13: 30 sec on LLE    Time  3    Period  Weeks    Status  Achieved      PT SHORT  TERM GOAL #4   Title  Pt will report being able to sleep through the night with 3 or fewer awakenings due to knee pain in order to maximize his recovery.     Baseline  11/13: pt states that it hasn't not bothered him in the last week and he has been sleeping through the night     Time  3    Period  Weeks    Status  Achieved        PT Long Term Goals - 01/04/17 1435      PT LONG TERM GOAL #1   Title  Pt will be able to ambulate at least 680f during the 3MWT with min to no gait deviation, 2/10 pain or less, and average gait velocity of at least 0.77m or > to maximize pt's community access.    Baseline  11/13: 70428f0/10 knee pain, 1.77m/877m  Time  6    Period  Weeks    Status  Achieved      PT LONG TERM GOAL #2   Title  Pt will have improved MMT to at least 4+/5 throughout LLE to maximize gait and balance.    Time  6    Period  Weeks    Status  Partially Met      PT LONG TERM GOAL #3   Title  Pt will have improved HS flexibility in 90/90 position to at least 150 deg to be symmetrical with RLE in order to decrease pain and maximize gait.    Time  6    Period  Weeks    Status  Achieved      PT LONG TERM GOAL #4   Title  Pt will have decreased overall pain rating of 2/10 or less to promote return to PLOF.     Baseline  11/13: no pain in the last week    Time  6    Period  Weeks    Status   Achieved      PT LONG TERM GOAL #5   Title  Pt will report being able to walk at least 1 mile, 3x/week to allow him to return to his regular walking program.     Baseline  11/13: pt stated he walked for1/2 a mile for 2 days last week with no pain    Time  6    Period  Weeks    Status  Partially Met            Plan - 01/04/17 1451    Clinical Impression Statement  Pt presented to therapy reporting that his knee has been feeling wonderful and he has been without pain for the last week. Pt verbalized that he would like to be discharged this date. PT reassessed pt's goals this date and pt has continued to make improvements since his reassessment last week. Due to pt request and progress made, pt will be discharged to his HEP. Updated HEP provided this date and pt verbalized understanding.     Rehab Potential  Fair    PT Frequency  2x / week    PT Duration  6 weeks    PT Treatment/Interventions  ADLs/Self Care Home Management;Cryotherapy;Electrical Stimulation;Moist Heat;Ultrasound;DME Instruction;Gait training;Stair training;Functional mobility training;Therapeutic activities;Therapeutic exercise;Balance training;Neuromuscular re-education;Patient/family education;Manual techniques;Passive range of motion;Dry needling;Energy conservation;Taping    PT Next Visit Plan  discharged    PT Home Exercise Plan  eval: L HS stretch, heel slides for pain improvement 11/9 heel raises; functional squat, Q-set;  prone ham curl; prone hip extension; 11/13: fwd/lat step ups, wall squats, SLS, SLS with vectors    Consulted and Agree with Plan of Care  Patient       Patient will benefit from skilled therapeutic intervention in order to improve the following deficits and impairments:  Abnormal gait, Decreased activity tolerance, Decreased balance, Decreased endurance, Decreased mobility, Decreased strength, Difficulty walking, Hypomobility, Increased muscle spasms, Impaired flexibility, Improper body mechanics,  Pain  Visit Diagnosis: Chronic pain of right knee  Difficulty in walking, not elsewhere classified  Muscle weakness (generalized)  Other symptoms and signs involving the musculoskeletal system     Problem List Patient Active Problem List   Diagnosis Date Noted  . Pneumothorax, traumatic 04/14/2016  . H. pylori infection 08/22/2014  . Reflux esophagitis   . Dysphagia, pharyngoesophageal phase   . Mucosal abnormality of stomach   . History of colonic polyps   . Chronic hepatitis C ([place]) 07/09/2014  . Abnormal weight loss 07/09/2014  . History of ETOH abuse 07/09/2014  . Esophageal dysphagia 07/09/2014  . Odynophagia 07/09/2014  . Abnormal ECG 05/28/2014       Geraldine Solar PT, DPT  [place] [address] [place], [place], [address] Phone: [phone]   Fax:  [phone]  Name: Bobby York MRN: [id number] Date of Birth: May 11, 1955

## 2017-01-06 ENCOUNTER — Ambulatory Visit (HOSPITAL_COMMUNITY): Payer: Medicare Other | Admitting: Physical Therapy

## 2017-01-11 ENCOUNTER — Encounter (HOSPITAL_COMMUNITY): Payer: Medicare Other

## 2017-01-26 DIAGNOSIS — Z0001 Encounter for general adult medical examination with abnormal findings: Secondary | ICD-10-CM | POA: Diagnosis not present

## 2017-01-26 DIAGNOSIS — Z6825 Body mass index (BMI) 25.0-25.9, adult: Secondary | ICD-10-CM | POA: Diagnosis not present

## 2017-01-26 DIAGNOSIS — R7309 Other abnormal glucose: Secondary | ICD-10-CM | POA: Diagnosis not present

## 2017-01-26 DIAGNOSIS — E782 Mixed hyperlipidemia: Secondary | ICD-10-CM | POA: Diagnosis not present

## 2017-01-26 DIAGNOSIS — B182 Chronic viral hepatitis C: Secondary | ICD-10-CM | POA: Diagnosis not present

## 2017-01-26 DIAGNOSIS — R7989 Other specified abnormal findings of blood chemistry: Secondary | ICD-10-CM | POA: Diagnosis not present

## 2017-02-02 ENCOUNTER — Ambulatory Visit (HOSPITAL_COMMUNITY)
Admission: RE | Admit: 2017-02-02 | Discharge: 2017-02-02 | Disposition: A | Discharge status: Home / Self Care | Payer: Medicare Other | Source: Ambulatory Visit | Attending: Internal Medicine | Admitting: Internal Medicine

## 2017-02-02 ENCOUNTER — Other Ambulatory Visit (HOSPITAL_COMMUNITY): Payer: Self-pay | Admitting: Internal Medicine

## 2017-02-02 DIAGNOSIS — F419 Anxiety disorder, unspecified: Secondary | ICD-10-CM | POA: Diagnosis not present

## 2017-02-02 DIAGNOSIS — R0600 Dyspnea, unspecified: Secondary | ICD-10-CM | POA: Insufficient documentation

## 2017-02-02 DIAGNOSIS — F329 Major depressive disorder, single episode, unspecified: Secondary | ICD-10-CM | POA: Diagnosis not present

## 2017-02-02 DIAGNOSIS — R0602 Shortness of breath: Secondary | ICD-10-CM

## 2017-02-02 DIAGNOSIS — K219 Gastro-esophageal reflux disease without esophagitis: Secondary | ICD-10-CM | POA: Diagnosis not present

## 2017-02-02 DIAGNOSIS — Z1389 Encounter for screening for other disorder: Secondary | ICD-10-CM | POA: Diagnosis not present

## 2017-02-02 DIAGNOSIS — Z6824 Body mass index (BMI) 24.0-24.9, adult: Secondary | ICD-10-CM | POA: Diagnosis not present

## 2017-03-01 DIAGNOSIS — R7989 Other specified abnormal findings of blood chemistry: Secondary | ICD-10-CM | POA: Diagnosis not present

## 2017-03-03 DIAGNOSIS — M542 Cervicalgia: Secondary | ICD-10-CM | POA: Diagnosis not present

## 2017-03-03 DIAGNOSIS — Z9889 Other specified postprocedural states: Secondary | ICD-10-CM | POA: Diagnosis not present

## 2017-03-03 DIAGNOSIS — M25512 Pain in left shoulder: Secondary | ICD-10-CM | POA: Diagnosis not present

## 2017-03-29 ENCOUNTER — Telehealth (HOSPITAL_COMMUNITY): Payer: Self-pay | Admitting: Internal Medicine

## 2017-03-29 NOTE — Telephone Encounter (Signed)
03/29/17 I left patient a message to find out the name of the dr that he saw that referred him to therapy.  I asked him when he came in the office today but he didn't know it at that time.  The referral he brought is in the bin on the wall by [facility]

## 2017-03-30 ENCOUNTER — Other Ambulatory Visit: Payer: Self-pay

## 2017-03-30 ENCOUNTER — Encounter (HOSPITAL_COMMUNITY): Payer: Self-pay | Admitting: Occupational Therapy

## 2017-03-30 ENCOUNTER — Ambulatory Visit (HOSPITAL_COMMUNITY): Payer: Medicare Other | Attending: Orthopedic Surgery | Admitting: Occupational Therapy

## 2017-03-30 DIAGNOSIS — M25512 Pain in left shoulder: Secondary | ICD-10-CM | POA: Diagnosis not present

## 2017-03-30 DIAGNOSIS — M25561 Pain in right knee: Secondary | ICD-10-CM | POA: Insufficient documentation

## 2017-03-30 DIAGNOSIS — R29898 Other symptoms and signs involving the musculoskeletal system: Secondary | ICD-10-CM | POA: Diagnosis not present

## 2017-03-30 DIAGNOSIS — M542 Cervicalgia: Secondary | ICD-10-CM | POA: Insufficient documentation

## 2017-03-30 DIAGNOSIS — G8929 Other chronic pain: Secondary | ICD-10-CM | POA: Insufficient documentation

## 2017-03-30 NOTE — Therapy (Signed)
[place], [place], [address] Phone: [phone]   Fax:  [phone]  Occupational Therapy Evaluation  Patient Details  Name: Bobby York MRN: [id number] Date of Birth: Jun 22, 1955 Referring Provider: Dr. Remo Lipps Case   Encounter Date: 03/30/2017  OT End of Session - 03/30/17 1115    Visit Number  1    Number of Visits  8    Date for OT Re-Evaluation  04/29/17    Authorization Type  Medicare A & B    OT Start Time  1034    OT Stop Time  1109    OT Time Calculation (min)  35 min    Activity Tolerance  Patient tolerated treatment well    Behavior During Therapy  [facility] for tasks assessed/performed       Past Medical History:  Diagnosis Date  . Bipolar 1 disorder ([place])    diagnosed 2004  . Hepatitis    Hep C    Past Surgical History:  Procedure Laterality Date  . BACK SURGERY     two-lumbar disc x2  . COLONOSCOPY WITH PROPOFOL N/A 07/25/2014   MWN:UUVOZD rectal polyp otherwise normal . hyperplastic polyp. TCS in 07/2024  . ESOPHAGEAL DILATION N/A 07/25/2014   Procedure: ESOPHAGEAL DILATION;  Surgeon: Daneil Dolin, MD;  Location: [facility];  Service: Endoscopy;  Laterality: N/A;  Malony dilators # 56,  . ESOPHAGOGASTRODUODENOSCOPY (EGD) WITH PROPOFOL N/A 07/25/2014   GUY:QIHK erosive reflux/s/p dilator/HH. +hpylor     There were no vitals filed for this visit.  Subjective Assessment - 03/30/17 1044    Subjective   S: I'm so used to it that I don't even feel it much.     Pertinent History  Pt is a 62 y/o male presenting with left shoulder pain present for approximately 1 year since he broke his collar bone, recently beginning to radiate to his cervical neck region. Pt is no longer taking Rx pain medication, is only using over the counter pain relievers. Dr. Remo Lipps Case referred pt to occupational therapy for evaluation and treatment.     Special Tests  FOTO-to be completed next session    Patient Stated Goals  To  be able to use my left arm    Currently in Pain?  Yes    Pain Score  6     Pain Location  Shoulder    Pain Orientation  Left    Pain Descriptors / Indicators  Aching;Sore    Pain Type  Chronic pain    Pain Radiating Towards  neck    Pain Onset  More than a month ago    Pain Frequency  Intermittent    Aggravating Factors   movement, use    Pain Relieving Factors  nothing    Effect of Pain on Daily Activities  mod effect on ADLs        [facility] OT Assessment - 03/30/17 1033      Assessment   Medical Diagnosis  left shoulder pain broke collar bone in Jan/Feb 2017    Referring Provider  Dr. Remo Lipps Case    Onset Date/Surgical Date  -- Jan/Feb 2017    Hand Dominance  Right    Next MD Visit  2 months    Prior Therapy  none      Precautions   Precautions  None      Restrictions   Weight Bearing Restrictions  No      Balance Screen  Has the patient fallen in the past 6 months  No    Has the patient had a decrease in activity level because of a fear of falling?   No    Is the patient reluctant to leave their home because of a fear of falling?   No      Home  Environment   Family/patient expects to be discharged to:  Private residence      Prior Function   Level of [place]  Retired    Leisure  riding his scooter      ADL   ADL comments  Pt having difficulty with ADLs-dressing tasks, sleeping, reaching overhead and behind back, turning head      Written Expression   Dominant Hand  Right      Cognition   Overall Cognitive Status  Within Functional Limits for tasks assessed      Observation/Other Assessments   Focus on Therapeutic Outcomes (FOTO)   Pt not set up-to be completed next session      ROM / Strength   AROM / PROM / Strength  AROM;PROM;Strength      Palpation   Palpation comment  Moderate fascial restrictions along upper arm, trapezius, and cervical neck regions      AROM   Overall AROM Comments  Assessed seated, er/IR adducted     AROM Assessment Site  Shoulder;Cervical    Right/Left Shoulder  Left    Left Shoulder Flexion  100 Degrees    Left Shoulder ABduction  67 Degrees    Left Shoulder Internal Rotation  90 Degrees    Left Shoulder External Rotation  61 Degrees    Cervical Flexion  45 WNL    Cervical Extension  45 WNL    Cervical - Right Side Bend  21    Cervical - Left Side Bend  20    Cervical - Right Rotation  70 WNL    Cervical - Left Rotation  31      PROM   Overall PROM Comments  Assessed supine, er/IR adducted    PROM Assessment Site  Shoulder    Right/Left Shoulder  Left    Left Shoulder Flexion  148 Degrees    Left Shoulder ABduction  135 Degrees    Left Shoulder Internal Rotation  90 Degrees    Left Shoulder External Rotation  90 Degrees      Strength   Overall Strength Comments  Assessed seated, er/IR adducted    Strength Assessment Site  Shoulder    Right/Left Shoulder  Left    Left Shoulder Flexion  3/5    Left Shoulder ABduction  3-/5    Left Shoulder Internal Rotation  3/5    Left Shoulder External Rotation  3/5                      OT Education - 03/30/17 1058    Education provided  Yes    Education Details  shoulder stretches, cervical neck A/ROM    Person(s) Educated  Patient    Methods  Explanation;Demonstration;Handout    Comprehension  Verbalized understanding;Returned demonstration       OT Short Term Goals - 03/30/17 1211      OT SHORT TERM GOAL #1   Title  Pt will be provided with HEP for improved mobility required for ADL performance and use of LUE as non-dominant.     Time  4    Period  Weeks    Status  New    Target Date  04/29/17      OT SHORT TERM GOAL #2   Title  Pt will decrease pain in LUE to 3/10 or less to improve ability to sleep.     Time  4    Period  Weeks    Status  New      OT SHORT TERM GOAL #3   Title  Pt will decrease LUE and cervical neck fascial restrictions to minimal amounts or less to improve mobility required for  functional reaching tasks.     Time  4    Period  Weeks    Status  New      OT SHORT TERM GOAL #4   Title  Pt will improve LUE A/ROM to [facility] to improve ability to perform dressing tasks reaching overhead and behind back.     Time  4    Period  Weeks    Status  New      OT SHORT TERM GOAL #5   Title  Pt will improve LUE strength to 4+/5 to increase ability to ride scooter.     Time  4    Period  Weeks    Status  New      Additional Short Term Goals   Additional Short Term Goals  Yes      OT SHORT TERM GOAL #6   Title  Pt will improve cervical neck ROM to WNL to increase ability to look over shoulder when driving scooter.     Time  4    Period  Weeks    Status  New               Plan - 03/30/17 1208    Clinical Impression Statement  A: Pt is a 62 y/o male presenting with left shoulder and cervical neck pain limiting ability to use LUE for ADL completion. Pt reports he has not been using his left arm for tasks due to it hurting. Pt provided with HEP for shoulder stretching and cervical A/ROM this session.     Occupational performance deficits (Please refer to evaluation for details):  ADL's;IADL's;Rest and Sleep;Leisure;Social Participation    Rehab Potential  Good    OT Frequency  2x / week    OT Duration  4 weeks    OT Treatment/Interventions  Self-care/ADL training;Ultrasound;Patient/family education;Passive range of motion;Cryotherapy;Electrical Stimulation;Moist Heat;Therapeutic exercise;Manual Therapy;Therapeutic activities    Plan  P: Pt will benefit from skilled OT services to decrease pain and fascial restrictions, improved ROM, strength, and functional use of LUE as well as improve ROM and pain in cervical neck. Treatment plan: myofascial release, manual therapy, P/ROM, A/ROM, cervical stretching, general LUE strengthening, scapular strengthening and stability    Clinical Decision Making  Limited treatment options, no task modification necessary    Consulted and  Agree with Plan of Care  Patient       Patient will benefit from skilled therapeutic intervention in order to improve the following deficits and impairments:  Decreased activity tolerance, Impaired flexibility, Decreased strength, Decreased range of motion, Pain, Increased fascial restrictions, Impaired UE functional use  Visit Diagnosis: Chronic left shoulder pain  Cervicalgia  Other symptoms and signs involving the musculoskeletal system    Problem List Patient Active Problem List   Diagnosis Date Noted  . Pneumothorax, traumatic 04/14/2016  . H. pylori infection 08/22/2014  . Reflux esophagitis   . Dysphagia, pharyngoesophageal phase   . Mucosal abnormality  of stomach   . History of colonic polyps   . Chronic hepatitis C ([place]) 07/09/2014  . Abnormal weight loss 07/09/2014  . History of ETOH abuse 07/09/2014  . Esophageal dysphagia 07/09/2014  . Odynophagia 07/09/2014  . Abnormal ECG 05/28/2014   Guadelupe Sabin, OTR/L  [phone] 03/30/2017, 12:14 PM  [place] [address] [place], [place], [address] Phone: [phone]   Fax:  [phone]  Name: Bobby York MRN: [id number] Date of Birth: Aug 11, 1955

## 2017-03-30 NOTE — Patient Instructions (Signed)
  1) Flexion Wall Stretch    Face wall, place affected handon wall in front of you. Slide hand up the wall  and lean body in towards the wall. Hold for 10 seconds. Repeat 3-5 times. 1-2 times/day.     2) Towel Stretch with Internal Rotation   Or     Gently pull up (or to the side) your affected arm  behind your back with the assist of a towel. Hold 10 seconds, repeat 3-5 times. 1-2 times/day.             3) Corner Stretch    Stand at a corner of a wall, place your arms on the walls with elbows bent. Lean into the corner until a stretch is felt along the front of your chest and/or shoulders. Hold for 10 seconds. Repeat 3-5X, 1-2 times/day.    4) Posterior Capsule Stretch    Bring the involved arm across chest. Grasp elbow and pull toward chest until you feel a stretch in the back of the upper arm and shoulder. Hold 10 seconds. Repeat 3-5X. Complete 1-2 times/day.    5) Scapular Retraction    Tuck chin back as you pinch shoulder blades together.  Hold 5 seconds. Repeat 3-5X. Complete 1-2 times/day.      AROM: Lateral Neck Flexion   Slowly tilt head toward one shoulder, then the other. Hold each position __5__ seconds. Repeat ___10_ times per set. Do ___1_ sets per session. Do __2__ sessions per day.  http://orth.exer.us/296   Copyright  VHI. All rights reserved.  AROM: Neck Extension   Bend head backward. Hold _5_ seconds. Repeat _10___ times per set. Do __1__ sets per session. Do __2__ sessions per day.  http://orth.exer.us/300   Copyright  VHI. All rights reserved.  AROM: Neck Flexion   Bend head forward. Hold __5__ seconds. Repeat __10__ times per set. Do _1___ sets per session. Do _2___ sessions per day.  http://orth.exer.us/298   Copyright  VHI. All rights reserved.  AROM: Neck Rotation   Turn head slowly to look over one shoulder, then the other. Hold each position __5__ seconds. Repeat __10__ times per set. Do _1___ sets per  session. Do _2___ sessions per day.  http://orth.exer.us/294   Copyright  VHI. All rights reserved.

## 2017-04-07 ENCOUNTER — Encounter (HOSPITAL_COMMUNITY): Payer: Self-pay

## 2017-04-07 ENCOUNTER — Other Ambulatory Visit: Payer: Self-pay

## 2017-04-07 ENCOUNTER — Ambulatory Visit (HOSPITAL_COMMUNITY): Payer: Medicare Other

## 2017-04-07 DIAGNOSIS — M25561 Pain in right knee: Secondary | ICD-10-CM | POA: Diagnosis not present

## 2017-04-07 DIAGNOSIS — G8929 Other chronic pain: Secondary | ICD-10-CM | POA: Diagnosis not present

## 2017-04-07 DIAGNOSIS — R29898 Other symptoms and signs involving the musculoskeletal system: Secondary | ICD-10-CM

## 2017-04-07 DIAGNOSIS — M542 Cervicalgia: Secondary | ICD-10-CM | POA: Diagnosis not present

## 2017-04-07 DIAGNOSIS — M25512 Pain in left shoulder: Secondary | ICD-10-CM | POA: Diagnosis not present

## 2017-04-07 NOTE — Therapy (Signed)
[place], [place], [address] Phone: [phone]   Fax:  [phone]  Occupational Therapy Treatment  Patient Details  Name: Bobby York MRN: [id number] Date of Birth: 19-Oct-1955 Referring Provider: Dr. Remo Lipps Case   Encounter Date: 04/07/2017  OT End of Session - 04/07/17 0851    Visit Number  2    Number of Visits  8    Date for OT Re-Evaluation  04/29/17    Authorization Type  Medicare A & B    OT Start Time  0820    OT Stop Time  0900    OT Time Calculation (min)  40 min    Activity Tolerance  Patient tolerated treatment well    Behavior During Therapy  [facility] for tasks assessed/performed       Past Medical History:  Diagnosis Date  . Bipolar 1 disorder ([place])    diagnosed 2004  . Hepatitis    Hep C    Past Surgical History:  Procedure Laterality Date  . BACK SURGERY     two-lumbar disc x2  . COLONOSCOPY WITH PROPOFOL N/A 07/25/2014   HBZ:JIRCVE rectal polyp otherwise normal . hyperplastic polyp. TCS in 07/2024  . ESOPHAGEAL DILATION N/A 07/25/2014   Procedure: ESOPHAGEAL DILATION;  Surgeon: Daneil Dolin, MD;  Location: [facility];  Service: Endoscopy;  Laterality: N/A;  Malony dilators # 56,  . ESOPHAGOGASTRODUODENOSCOPY (EGD) WITH PROPOFOL N/A 07/25/2014   LFY:BOFB erosive reflux/s/p dilator/HH. +hpylor     There were no vitals filed for this visit.  Subjective Assessment - 04/07/17 0843    Subjective   S: I can tell it's a little better.    Special Tests  FOTO: 54/100    Currently in Pain?  Yes    Pain Score  6     Pain Location  Shoulder    Pain Orientation  Left    Pain Descriptors / Indicators  Aching;Sore    Pain Type  Chronic pain    Pain Radiating Towards  neck    Pain Onset  More than a month ago    Pain Frequency  Intermittent    Aggravating Factors   movement, use    Pain Relieving Factors  nothing    Effect of Pain on Daily Activities  mod effect    Multiple Pain Sites  No          [facility] OT Assessment - 04/07/17 0844      Assessment   Medical Diagnosis  left shoulder pain      Precautions   Precautions  None               OT Treatments/Exercises (OP) - 04/07/17 0844      Exercises   Exercises  Shoulder      Shoulder Exercises: Supine   Protraction  PROM;AROM;10 reps    Horizontal ABduction  PROM;AROM;10 reps    External Rotation  PROM;AROM;10 reps    Internal Rotation  PROM;AROM;10 reps    Flexion  PROM;AROM;10 reps    ABduction  PROM;AROM;10 reps      Shoulder Exercises: Standing   Extension  Theraband;10 reps    Theraband Level (Shoulder Extension)  Level 2 (Red)    Row  Theraband;10 reps    Theraband Level (Shoulder Row)  Level 2 (Red)      Shoulder Exercises: ROM/Strengthening   UBE (Upper Arm Bike)  Level 1 2' reverse only    X to V Arms  10X      Manual Therapy   Manual Therapy  Joint mobilization;Myofascial release    Manual therapy comments  Manual therapy completed prior to exercises.     Joint Mobilization  Left side Facet Down Glide Mobilization completed to increase cervical mobility. Levator and upper trapezius stretch with subocciptal release completed to decrease trigger points and fasical restrictions along left cervical region.      Myofascial Release  Myofascial release and manual stretching completed to left upper trapezius to decrease fascial restrictions and increase joint mobility and increase joint mobility in a pain free zone.              OT Education - 04/07/17 0859    Education provided  Yes    Education Details  Pt was given OT evaluation handout.    Person(s) Educated  Patient    Methods  Handout;Explanation    Comprehension  Verbalized understanding       OT Short Term Goals - 04/07/17 0851      OT SHORT TERM GOAL #1   Title  Pt will be provided with HEP for improved mobility required for ADL performance and use of LUE as non-dominant.     Time  4    Period  Weeks    Status  On-going       OT SHORT TERM GOAL #2   Title  Pt will decrease pain in LUE to 3/10 or less to improve ability to sleep.     Time  4    Period  Weeks    Status  On-going      OT SHORT TERM GOAL #3   Title  Pt will decrease LUE and cervical neck fascial restrictions to minimal amounts or less to improve mobility required for functional reaching tasks.     Time  4    Period  Weeks    Status  On-going      OT SHORT TERM GOAL #4   Title  Pt will improve LUE A/ROM to [facility] to improve ability to perform dressing tasks reaching overhead and behind back.     Time  4    Period  Weeks    Status  On-going      OT SHORT TERM GOAL #5   Title  Pt will improve LUE strength to 4+/5 to increase ability to ride scooter.     Time  4    Period  Weeks    Status  On-going      OT SHORT TERM GOAL #6   Title  Pt will improve cervical neck ROM to WNL to increase ability to look over shoulder when driving scooter.     Time  4    Period  Weeks    Status  On-going               Plan - 04/07/17 0907    Clinical Impression Statement  A: Initiated myofascial release on left cervical region proximal to upper trapezius. patient with very tender trigger point although did respond well to manual therapy to release. Pt required VC for form and technique during session. Instability in shoulder joint noted during passive ROM.     Plan  P: Add proximal shoulder strengthening.     Consulted and Agree with Plan of Care  Patient       Patient will benefit from skilled therapeutic intervention in order to improve the following deficits and impairments:  Decreased activity tolerance, Impaired flexibility, Decreased strength,  Decreased range of motion, Pain, Increased fascial restrictions, Impaired UE functional use  Visit Diagnosis: Chronic left shoulder pain  Cervicalgia  Other symptoms and signs involving the musculoskeletal system    Problem List Patient Active Problem List   Diagnosis Date Noted  .  Pneumothorax, traumatic 04/14/2016  . H. pylori infection 08/22/2014  . Reflux esophagitis   . Dysphagia, pharyngoesophageal phase   . Mucosal abnormality of stomach   . History of colonic polyps   . Chronic hepatitis C ([place]) 07/09/2014  . Abnormal weight loss 07/09/2014  . History of ETOH abuse 07/09/2014  . Esophageal dysphagia 07/09/2014  . Odynophagia 07/09/2014  . Abnormal ECG 05/28/2014   Ailene Ravel, OTR/L,CBIS  [phone]  04/07/2017, 9:10 AM  [facility] [address] [place], [place], [address] Phone: [phone]   Fax:  [phone]  Name: KYVON HU MRN: [id number] Date of Birth: May 14, 1955

## 2017-04-12 ENCOUNTER — Ambulatory Visit (HOSPITAL_COMMUNITY): Payer: Medicare Other | Admitting: Specialist

## 2017-04-12 DIAGNOSIS — M542 Cervicalgia: Secondary | ICD-10-CM | POA: Diagnosis not present

## 2017-04-12 DIAGNOSIS — M25561 Pain in right knee: Secondary | ICD-10-CM | POA: Diagnosis not present

## 2017-04-12 DIAGNOSIS — R29898 Other symptoms and signs involving the musculoskeletal system: Secondary | ICD-10-CM

## 2017-04-12 DIAGNOSIS — M25512 Pain in left shoulder: Secondary | ICD-10-CM | POA: Diagnosis not present

## 2017-04-12 DIAGNOSIS — G8929 Other chronic pain: Secondary | ICD-10-CM

## 2017-04-12 NOTE — Therapy (Signed)
[place], [place], [address] Phone: [phone]   Fax:  [phone]  Occupational Therapy Treatment  Patient Details  Name: Bobby York MRN: [id number] Date of Birth: [birth year] Referring Provider: Dr. Remo Lipps Case   Encounter Date: 04/12/2017  OT End of Session - 04/12/17 1202    Visit Number  3    Number of Visits  8    Date for OT Re-Evaluation  04/29/17    Authorization Type  Medicare A & B    OT Start Time  1120    OT Stop Time  1200    OT Time Calculation (min)  40 min    Activity Tolerance  Patient tolerated treatment well    Behavior During Therapy  [facility] for tasks assessed/performed       Past Medical History:  Diagnosis Date  . Bipolar 1 disorder ([place])    diagnosed 2004  . Hepatitis    Hep C    Past Surgical History:  Procedure Laterality Date  . BACK SURGERY     two-lumbar disc x2  . COLONOSCOPY WITH PROPOFOL N/A 07/25/2014   GMW:NUUVOZ rectal polyp otherwise normal . hyperplastic polyp. TCS in 07/2024  . ESOPHAGEAL DILATION N/A 07/25/2014   Procedure: ESOPHAGEAL DILATION;  Surgeon: Daneil Dolin, MD;  Location: [facility];  Service: Endoscopy;  Laterality: N/A;  Malony dilators # 56,  . ESOPHAGOGASTRODUODENOSCOPY (EGD) WITH PROPOFOL N/A 07/25/2014   DGU:YQIH erosive reflux/s/p dilator/HH. +hpylor     There were no vitals filed for this visit.  Subjective Assessment - 04/12/17 1205    Subjective   S:  I couldnt move it this far last week.     Currently in Pain?  Yes    Pain Score  5     Pain Location  Shoulder    Pain Orientation  Left    Pain Descriptors / Indicators  Aching                   OT Treatments/Exercises (OP) - 04/12/17 0001      Exercises   Exercises  Shoulder      Shoulder Exercises: Supine   Protraction  PROM;5 reps;AROM;12 reps    Horizontal ABduction  PROM;5 reps;AROM;12 reps    External Rotation  PROM;5 reps;AROM;12 reps    Internal Rotation  PROM;5  reps;AROM;12 reps    Flexion  PROM;5 reps;attempted A/ROM, unable to complete due to pain.     ABduction  PROM;5 reps;AROM;12 reps      Shoulder Exercises: Standing   Protraction  AROM;10 reps    Horizontal ABduction  AROM;10 reps    External Rotation  AROM;10 reps    Internal Rotation  AROM;10 reps    Flexion  AROM;10 reps    ABduction  AROM;10 reps      Shoulder Exercises: ROM/Strengthening   "W" Arms  10 X    X to V Arms  10X    Proximal Shoulder Strengthening, Supine  10X    Proximal Shoulder Strengthening, Seated  10X     Ball on Wall  1 min with arm flexed to 90, attempted 1 min with arm abducted to 90, only able to complete 30 seconds       Manual Therapy   Manual Therapy  Myofascial release    Manual therapy comments  Manual therapy completed prior to exercises.     Myofascial Release  Myofascial release and manual stretching completed to left upper trapezius  to decrease fascial restrictions and increase joint mobility and increase joint mobility in a pain free zone.                OT Short Term Goals - 04/07/17 0851      OT SHORT TERM GOAL #1   Title  Pt will be provided with HEP for improved mobility required for ADL performance and use of LUE as non-dominant.     Time  4    Period  Weeks    Status  On-going      OT SHORT TERM GOAL #2   Title  Pt will decrease pain in LUE to 3/10 or less to improve ability to sleep.     Time  4    Period  Weeks    Status  On-going      OT SHORT TERM GOAL #3   Title  Pt will decrease LUE and cervical neck fascial restrictions to minimal amounts or less to improve mobility required for functional reaching tasks.     Time  4    Period  Weeks    Status  On-going      OT SHORT TERM GOAL #4   Title  Pt will improve LUE A/ROM to [facility] to improve ability to perform dressing tasks reaching overhead and behind back.     Time  4    Period  Weeks    Status  On-going      OT SHORT TERM GOAL #5   Title  Pt will improve LUE  strength to 4+/5 to increase ability to ride scooter.     Time  4    Period  Weeks    Status  On-going      OT SHORT TERM GOAL #6   Title  Pt will improve cervical neck ROM to WNL to increase ability to look over shoulder when driving scooter.     Time  4    Period  Weeks    Status  On-going               Plan - 04/12/17 1203    Clinical Impression Statement  A:  Patient able to complete A/ROM in standing this date, as well as additional proximal shoulder strengthening exercises.  Attempted ball on wall activity with arm abducted and he was unable to complete for desired time of 1 minute due to increased pain.  Abduction too painful to complete in supine positioning however was able to complete seated with short arm range.     Plan  P:  Attempt abduction exercises through full range in supine and seated if pain level subsides.         Patient will benefit from skilled therapeutic intervention in order to improve the following deficits and impairments:  Decreased activity tolerance, Impaired flexibility, Decreased strength, Decreased range of motion, Pain, Increased fascial restrictions, Impaired UE functional use  Visit Diagnosis: Chronic left shoulder pain  Cervicalgia  Other symptoms and signs involving the musculoskeletal system    Problem List Patient Active Problem List   Diagnosis Date Noted  . Pneumothorax, traumatic 04/14/2016  . H. pylori infection 08/22/2014  . Reflux esophagitis   . Dysphagia, pharyngoesophageal phase   . Mucosal abnormality of stomach   . History of colonic polyps   . Chronic hepatitis C ([place]) 07/09/2014  . Abnormal weight loss 07/09/2014  . History of ETOH abuse 07/09/2014  . Esophageal dysphagia 07/09/2014  . Odynophagia 07/09/2014  . Abnormal ECG 05/28/2014  Vangie Bicker, [place], OTR/L [phone]  04/12/2017, 12:06 PM  [facility] [address] [place], [place],  [address] Phone: [phone]   Fax:  [phone]  Name: DSHAUN REPPUCCI MRN: [id number] Date of Birth: Mar 19, 1955

## 2017-04-14 ENCOUNTER — Other Ambulatory Visit: Payer: Self-pay

## 2017-04-14 ENCOUNTER — Encounter (HOSPITAL_COMMUNITY): Payer: Self-pay

## 2017-04-14 ENCOUNTER — Ambulatory Visit (HOSPITAL_COMMUNITY): Payer: Medicare Other

## 2017-04-14 DIAGNOSIS — M542 Cervicalgia: Secondary | ICD-10-CM

## 2017-04-14 DIAGNOSIS — M25512 Pain in left shoulder: Principal | ICD-10-CM

## 2017-04-14 DIAGNOSIS — M25561 Pain in right knee: Secondary | ICD-10-CM | POA: Diagnosis not present

## 2017-04-14 DIAGNOSIS — R29898 Other symptoms and signs involving the musculoskeletal system: Secondary | ICD-10-CM

## 2017-04-14 DIAGNOSIS — G8929 Other chronic pain: Secondary | ICD-10-CM | POA: Diagnosis not present

## 2017-04-14 NOTE — Therapy (Signed)
[place], [place], [address] Phone: [phone]   Fax:  [phone]  Occupational Therapy Treatment  Patient Details  Name: Bobby York MRN: [id number] Date of Birth: 04-26-1955 Referring Provider: Dr. Remo Lipps Case   Encounter Date: 04/14/2017  OT End of Session - 04/14/17 1500    Visit Number  4    Number of Visits  8    Date for OT Re-Evaluation  04/29/17    Authorization Type  Medicare A & B    OT Start Time  1436    OT Stop Time  1515    OT Time Calculation (min)  39 min    Activity Tolerance  Patient tolerated treatment well    Behavior During Therapy  [facility] for tasks assessed/performed       Past Medical History:  Diagnosis Date  . Bipolar 1 disorder ([place])    diagnosed 2004  . Hepatitis    Hep C    Past Surgical History:  Procedure Laterality Date  . BACK SURGERY     two-lumbar disc x2  . COLONOSCOPY WITH PROPOFOL N/A 07/25/2014   PNT:IRWERX rectal polyp otherwise normal . hyperplastic polyp. TCS in 07/2024  . ESOPHAGEAL DILATION N/A 07/25/2014   Procedure: ESOPHAGEAL DILATION;  Surgeon: Daneil Dolin, MD;  Location: [facility];  Service: Endoscopy;  Laterality: N/A;  Malony dilators # 56,  . ESOPHAGOGASTRODUODENOSCOPY (EGD) WITH PROPOFOL N/A 07/25/2014   VQM:GQQP erosive reflux/s/p dilator/HH. +hpylor     There were no vitals filed for this visit.  Subjective Assessment - 04/14/17 1450    Subjective   S: It's much better.    Currently in Pain?  Yes    Pain Score  4     Pain Location  Shoulder    Pain Orientation  Left    Pain Descriptors / Indicators  Aching    Pain Type  Chronic pain    Pain Radiating Towards  neck    Pain Onset  More than a month ago    Pain Frequency  Intermittent    Aggravating Factors   putting on my shirt    Pain Relieving Factors  nothing    Effect of Pain on Daily Activities  min effect    Multiple Pain Sites  No         [facility] OT Assessment - 04/14/17 1451      Assessment   Medical Diagnosis  left shoulder pain      Precautions   Precautions  None               OT Treatments/Exercises (OP) - 04/14/17 1451      Exercises   Exercises  Shoulder      Shoulder Exercises: Supine   Protraction  PROM;5 reps;AROM;12 reps    Horizontal ABduction  PROM;5 reps;AROM;12 reps    External Rotation  PROM;5 reps;AROM;12 reps    Internal Rotation  PROM;5 reps;AROM;12 reps    Flexion  PROM;5 reps;AROM;12 reps    ABduction  PROM;5 reps;AROM;12 reps      Shoulder Exercises: Standing   Protraction  AROM;12 reps    Horizontal ABduction  AROM;12 reps    External Rotation  AROM;12 reps    Internal Rotation  AROM;12 reps    Flexion  AROM;12 reps    ABduction  AROM;12 reps    Extension  Theraband;10 reps    Theraband Level (Shoulder Extension)  Level 2 (Red)    Row  Theraband;10 reps    Theraband Level (Shoulder Row)  Level 2 (Red)      Shoulder Exercises: ROM/Strengthening   UBE (Upper Arm Bike)  Level 1 2' reverse only    X to V Arms  10X    Proximal Shoulder Strengthening, Supine  10X    Proximal Shoulder Strengthening, Seated  10X       Manual Therapy   Manual Therapy  Myofascial release    Manual therapy comments  Manual therapy completed prior to exercises.     Myofascial Release  Myofascial release and manual stretching completed to left upper trapezius to decrease fascial restrictions and increase joint mobility and increase joint mobility in a pain free zone.                OT Short Term Goals - 04/07/17 0851      OT SHORT TERM GOAL #1   Title  Pt will be provided with HEP for improved mobility required for ADL performance and use of LUE as non-dominant.     Time  4    Period  Weeks    Status  On-going      OT SHORT TERM GOAL #2   Title  Pt will decrease pain in LUE to 3/10 or less to improve ability to sleep.     Time  4    Period  Weeks    Status  On-going      OT SHORT TERM GOAL #3   Title  Pt will decrease  LUE and cervical neck fascial restrictions to minimal amounts or less to improve mobility required for functional reaching tasks.     Time  4    Period  Weeks    Status  On-going      OT SHORT TERM GOAL #4   Title  Pt will improve LUE A/ROM to [facility] to improve ability to perform dressing tasks reaching overhead and behind back.     Time  4    Period  Weeks    Status  On-going      OT SHORT TERM GOAL #5   Title  Pt will improve LUE strength to 4+/5 to increase ability to ride scooter.     Time  4    Period  Weeks    Status  On-going      OT SHORT TERM GOAL #6   Title  Pt will improve cervical neck ROM to WNL to increase ability to look over shoulder when driving scooter.     Time  4    Period  Weeks    Status  On-going               Plan - 04/14/17 1623    Clinical Impression Statement  A: Pt was able to achieve full passive ROM this session and almost full A/ROM as well. Completed A/ROM supine and standing to increase shoulder stability and strength. Patient had some muscle fatigue and took rest breaks when needed. VC for form and technique.    Plan  P: Continue with ball on the ball and add overhead lacing.       Patient will benefit from skilled therapeutic intervention in order to improve the following deficits and impairments:  Decreased activity tolerance, Impaired flexibility, Decreased strength, Decreased range of motion, Pain, Increased fascial restrictions, Impaired UE functional use  Visit Diagnosis: Chronic left shoulder pain  Cervicalgia  Other symptoms and signs involving the musculoskeletal system    Problem List Patient  Active Problem List   Diagnosis Date Noted  . Pneumothorax, traumatic 04/14/2016  . H. pylori infection 08/22/2014  . Reflux esophagitis   . Dysphagia, pharyngoesophageal phase   . Mucosal abnormality of stomach   . History of colonic polyps   . Chronic hepatitis C ([place]) 07/09/2014  . Abnormal weight loss 07/09/2014  . History  of ETOH abuse 07/09/2014  . Esophageal dysphagia 07/09/2014  . Odynophagia 07/09/2014  . Abnormal ECG 05/28/2014   Ailene Ravel, OTR/L,CBIS  [phone]  04/14/2017, 4:29 PM  [place] [address] [place], [place], [address] Phone: [phone]   Fax:  [phone]  Name: Bobby York MRN: [id number] Date of Birth: 1955/05/29

## 2017-04-20 ENCOUNTER — Ambulatory Visit (HOSPITAL_COMMUNITY): Payer: Medicare Other

## 2017-04-20 ENCOUNTER — Other Ambulatory Visit: Payer: Self-pay

## 2017-04-20 DIAGNOSIS — R29898 Other symptoms and signs involving the musculoskeletal system: Secondary | ICD-10-CM | POA: Diagnosis not present

## 2017-04-20 DIAGNOSIS — M542 Cervicalgia: Secondary | ICD-10-CM

## 2017-04-20 DIAGNOSIS — G8929 Other chronic pain: Secondary | ICD-10-CM | POA: Diagnosis not present

## 2017-04-20 DIAGNOSIS — M25561 Pain in right knee: Secondary | ICD-10-CM

## 2017-04-20 DIAGNOSIS — M25512 Pain in left shoulder: Principal | ICD-10-CM

## 2017-04-20 NOTE — Therapy (Signed)
[place], [place], [address] Phone: [phone]   Fax:  [phone]  Occupational Therapy Treatment  Patient Details  Name: Bobby York MRN: [id number] Date of Birth: 11-11-1955 Referring Provider: Dr. Remo Lipps Case   Encounter Date: 04/20/2017  OT End of Session - 04/20/17 1321    Visit Number  5    Number of Visits  8    Date for OT Re-Evaluation  04/29/17    Authorization Type  Medicare A & B    OT Start Time  1305    OT Stop Time  1345    OT Time Calculation (min)  40 min    Activity Tolerance  Patient tolerated treatment well    Behavior During Therapy  [facility] for tasks assessed/performed       Past Medical History:  Diagnosis Date  . Bipolar 1 disorder ([place])    diagnosed 2004  . Hepatitis    Hep C    Past Surgical History:  Procedure Laterality Date  . BACK SURGERY     two-lumbar disc x2  . COLONOSCOPY WITH PROPOFOL N/A 07/25/2014   JSE:GBTDVV rectal polyp otherwise normal . hyperplastic polyp. TCS in 07/2024  . ESOPHAGEAL DILATION N/A 07/25/2014   Procedure: ESOPHAGEAL DILATION;  Surgeon: Daneil Dolin, MD;  Location: [facility];  Service: Endoscopy;  Laterality: N/A;  Malony dilators # 56,  . ESOPHAGOGASTRODUODENOSCOPY (EGD) WITH PROPOFOL N/A 07/25/2014   OHY:WVPX erosive reflux/s/p dilator/HH. +hpylor     There were no vitals filed for this visit.  Subjective Assessment - 04/20/17 1309    Subjective   S: I may have slept on it.    Currently in Pain?  Yes    Pain Score  7     Pain Location  Shoulder    Pain Descriptors / Indicators  Aching    Pain Type  Chronic pain         [facility] OT Assessment - 04/20/17 1319      Assessment   Medical Diagnosis  left shoulder pain      Precautions   Precautions  None               OT Treatments/Exercises (OP) - 04/20/17 1319      Exercises   Exercises  Shoulder      Shoulder Exercises: Supine   Protraction  PROM;5 reps;AROM;15 reps     Horizontal ABduction  PROM;5 reps;AROM;15 reps    External Rotation  PROM;5 reps;AROM;15 reps    Internal Rotation  PROM;5 reps;AROM;15 reps    Flexion  PROM;5 reps;AROM;15 reps    ABduction  PROM;5 reps;AROM;15 reps      Shoulder Exercises: Standing   Protraction  AROM;15 reps    Horizontal ABduction  AROM;15 reps    External Rotation  AROM;15 reps    Internal Rotation  AROM;15 reps    Flexion  AROM;15 reps    ABduction  AROM;15 reps      Shoulder Exercises: ROM/Strengthening   Over Head Lace  2'    X to V Arms  12X    Proximal Shoulder Strengthening, Supine  12X    Ball on Wall  1' flexion       Manual Therapy   Manual Therapy  Myofascial release    Manual therapy comments  Manual therapy completed prior to exercises.     Myofascial Release  Myofascial release and manual stretching completed to left upper trapezius to decrease fascial restrictions  and increase joint mobility and increase joint mobility in a pain free zone.                OT Short Term Goals - 04/07/17 0851      OT SHORT TERM GOAL #1   Title  Pt will be provided with HEP for improved mobility required for ADL performance and use of LUE as non-dominant.     Time  4    Period  Weeks    Status  On-going      OT SHORT TERM GOAL #2   Title  Pt will decrease pain in LUE to 3/10 or less to improve ability to sleep.     Time  4    Period  Weeks    Status  On-going      OT SHORT TERM GOAL #3   Title  Pt will decrease LUE and cervical neck fascial restrictions to minimal amounts or less to improve mobility required for functional reaching tasks.     Time  4    Period  Weeks    Status  On-going      OT SHORT TERM GOAL #4   Title  Pt will improve LUE A/ROM to [facility] to improve ability to perform dressing tasks reaching overhead and behind back.     Time  4    Period  Weeks    Status  On-going      OT SHORT TERM GOAL #5   Title  Pt will improve LUE strength to 4+/5 to increase ability to ride scooter.      Time  4    Period  Weeks    Status  On-going      OT SHORT TERM GOAL #6   Title  Pt will improve cervical neck ROM to WNL to increase ability to look over shoulder when driving scooter.     Time  4    Period  Weeks    Status  On-going               Plan - 04/20/17 1343    Clinical Impression Statement  A: Increased repetitions with A/ROM and added overhead lacing to work on shoulder stability and endurance. Patient was able to complete the full minute for ball on the wall abduction. VC for form and technique.    Plan  P: Continue to work on Magazine features editor. Add 1# handweight supine if able to tolerate.     Consulted and Agree with Plan of Care  Patient       Patient will benefit from skilled therapeutic intervention in order to improve the following deficits and impairments:  Decreased activity tolerance, Impaired flexibility, Decreased strength, Decreased range of motion, Pain, Increased fascial restrictions, Impaired UE functional use  Visit Diagnosis: Chronic left shoulder pain  Cervicalgia  Other symptoms and signs involving the musculoskeletal system  Chronic pain of right knee    Problem List Patient Active Problem List   Diagnosis Date Noted  . Pneumothorax, traumatic 04/14/2016  . H. pylori infection 08/22/2014  . Reflux esophagitis   . Dysphagia, pharyngoesophageal phase   . Mucosal abnormality of stomach   . History of colonic polyps   . Chronic hepatitis C ([place]) 07/09/2014  . Abnormal weight loss 07/09/2014  . History of ETOH abuse 07/09/2014  . Esophageal dysphagia 07/09/2014  . Odynophagia 07/09/2014  . Abnormal ECG 05/28/2014   Ailene Ravel, OTR/L,CBIS  [phone]  04/20/2017, 1:49 PM  [facility]  [place], [place], [address] Phone: [phone]   Fax:  [phone]  Name: Bobby York MRN: [id number] Date of Birth: 07/29/1955

## 2017-04-22 ENCOUNTER — Encounter (HOSPITAL_COMMUNITY): Payer: Self-pay | Admitting: Occupational Therapy

## 2017-04-22 ENCOUNTER — Ambulatory Visit (HOSPITAL_COMMUNITY): Payer: Medicare Other | Attending: Orthopedic Surgery | Admitting: Occupational Therapy

## 2017-04-22 DIAGNOSIS — G8929 Other chronic pain: Secondary | ICD-10-CM | POA: Diagnosis not present

## 2017-04-22 DIAGNOSIS — M542 Cervicalgia: Secondary | ICD-10-CM | POA: Diagnosis not present

## 2017-04-22 DIAGNOSIS — M25512 Pain in left shoulder: Secondary | ICD-10-CM | POA: Insufficient documentation

## 2017-04-22 DIAGNOSIS — R29898 Other symptoms and signs involving the musculoskeletal system: Secondary | ICD-10-CM | POA: Insufficient documentation

## 2017-04-22 NOTE — Therapy (Signed)
[place], [place], [address] Phone: [phone]   Fax:  [phone]  Occupational Therapy Treatment  Patient Details  Name: Bobby York MRN: [id number] Date of Birth: 1955/11/06 Referring Provider: Dr. Remo Lipps Case   Encounter Date: 04/22/2017  OT End of Session - 04/22/17 1142    Visit Number  6    Number of Visits  8    Date for OT Re-Evaluation  04/29/17    Authorization Type  Medicare A & B    OT Start Time  1127 pt arrived late    OT Stop Time  1200    OT Time Calculation (min)  33 min    Activity Tolerance  Patient tolerated treatment well    Behavior During Therapy  [facility] for tasks assessed/performed       Past Medical History:  Diagnosis Date  . Bipolar 1 disorder ([place])    diagnosed 2004  . Hepatitis    Hep C    Past Surgical History:  Procedure Laterality Date  . BACK SURGERY     two-lumbar disc x2  . COLONOSCOPY WITH PROPOFOL N/A 07/25/2014   QIW:LNLGXQ rectal polyp otherwise normal . hyperplastic polyp. TCS in 07/2024  . ESOPHAGEAL DILATION N/A 07/25/2014   Procedure: ESOPHAGEAL DILATION;  Surgeon: Daneil Dolin, MD;  Location: [facility];  Service: Endoscopy;  Laterality: N/A;  Malony dilators # 56,  . ESOPHAGOGASTRODUODENOSCOPY (EGD) WITH PROPOFOL N/A 07/25/2014   JJH:ERDE erosive reflux/s/p dilator/HH. +hpylor     There were no vitals filed for this visit.  Subjective Assessment - 04/22/17 1126    Subjective   S: This shoulder don't take breaks.     Currently in Pain?  Yes    Pain Score  6     Pain Location  Shoulder    Pain Orientation  Left    Pain Descriptors / Indicators  Sore;Aching    Pain Type  Chronic pain    Pain Radiating Towards  neck    Pain Onset  More than a month ago    Pain Frequency  Intermittent    Aggravating Factors   anything    Pain Relieving Factors  heat    Effect of Pain on Daily Activities  min effect on ADLs    Multiple Pain Sites  No         [facility] OT  Assessment - 04/22/17 1126      Assessment   Medical Diagnosis  left shoulder pain      Precautions   Precautions  None               OT Treatments/Exercises (OP) - 04/22/17 1129      Exercises   Exercises  Shoulder      Shoulder Exercises: Supine   Protraction  PROM;5 reps;Strengthening;12 reps    Protraction Weight (lbs)  1    Horizontal ABduction  PROM;5 reps;Strengthening;12 reps    Horizontal ABduction Weight (lbs)  1    External Rotation  PROM;5 reps;Strengthening;12 reps    External Rotation Weight (lbs)  1    Internal Rotation  PROM;5 reps;Strengthening;12 reps    Internal Rotation Weight (lbs)  1    Flexion  PROM;5 reps;Strengthening;12 reps    Shoulder Flexion Weight (lbs)  1    ABduction  PROM;5 reps;Strengthening;12 reps    Shoulder ABduction Weight (lbs)  1      Shoulder Exercises: Standing   Protraction  Strengthening;10 reps  Protraction Weight (lbs)  1    Horizontal ABduction  Strengthening;10 reps    Horizontal ABduction Weight (lbs)  1    External Rotation  Strengthening;10 reps    External Rotation Weight (lbs)  1    Internal Rotation  Strengthening;10 reps    Internal Rotation Weight (lbs)  1    Flexion  Strengthening;10 reps    Shoulder Flexion Weight (lbs)  1    ABduction  Strengthening;10 reps    Shoulder ABduction Weight (lbs)  1    Extension  Theraband;10 reps    Theraband Level (Shoulder Extension)  Level 2 (Red)    Row  Theraband;10 reps    Theraband Level (Shoulder Row)  Level 2 (Red)    Retraction  Theraband;10 reps    Theraband Level (Shoulder Retraction)  Level 2 (Red)      Shoulder Exercises: ROM/Strengthening   X to V Arms  10X, 1#     Proximal Shoulder Strengthening, Supine  15X, 1# weight, no rest breaks    Ball on Wall  1' flexion 1' abduction    Other ROM/Strengthening Exercises  medium green therapy ball: chest press, overhead press, side to side twist, 10X each               OT Short Term Goals -  04/07/17 0851      OT SHORT TERM GOAL #1   Title  Pt will be provided with HEP for improved mobility required for ADL performance and use of LUE as non-dominant.     Time  4    Period  Weeks    Status  On-going      OT SHORT TERM GOAL #2   Title  Pt will decrease pain in LUE to 3/10 or less to improve ability to sleep.     Time  4    Period  Weeks    Status  On-going      OT SHORT TERM GOAL #3   Title  Pt will decrease LUE and cervical neck fascial restrictions to minimal amounts or less to improve mobility required for functional reaching tasks.     Time  4    Period  Weeks    Status  On-going      OT SHORT TERM GOAL #4   Title  Pt will improve LUE A/ROM to [facility] to improve ability to perform dressing tasks reaching overhead and behind back.     Time  4    Period  Weeks    Status  On-going      OT SHORT TERM GOAL #5   Title  Pt will improve LUE strength to 4+/5 to increase ability to ride scooter.     Time  4    Period  Weeks    Status  On-going      OT SHORT TERM GOAL #6   Title  Pt will improve cervical neck ROM to WNL to increase ability to look over shoulder when driving scooter.     Time  4    Period  Weeks    Status  On-going               Plan - 04/22/17 1142    Clinical Impression Statement  A: Pt arrived late to session, therefore manual therapy not completed. Added 1# weight supine and standing, pt with min fatigue during strengthening exercises. Added green therapy ball exercises with mod fatigue. Verbal cuing for form and technique.     Plan  P: Continue with strengthening, add wall push ups       Patient will benefit from skilled therapeutic intervention in order to improve the following deficits and impairments:  Decreased activity tolerance, Impaired flexibility, Decreased strength, Decreased range of motion, Pain, Increased fascial restrictions, Impaired UE functional use  Visit Diagnosis: Chronic left shoulder pain  Other symptoms and signs  involving the musculoskeletal system    Problem List Patient Active Problem List   Diagnosis Date Noted  . Pneumothorax, traumatic 04/14/2016  . H. pylori infection 08/22/2014  . Reflux esophagitis   . Dysphagia, pharyngoesophageal phase   . Mucosal abnormality of stomach   . History of colonic polyps   . Chronic hepatitis C ([place]) 07/09/2014  . Abnormal weight loss 07/09/2014  . History of ETOH abuse 07/09/2014  . Esophageal dysphagia 07/09/2014  . Odynophagia 07/09/2014  . Abnormal ECG 05/28/2014   Guadelupe Sabin, OTR/L  [phone] 04/22/2017, 1:17 PM  [facility] [address] [place], [place], [address] Phone: [phone]   Fax:  [phone]  Name: SIGFREDO SCHREIER MRN: [id number] Date of Birth: March 07, 1955

## 2017-04-26 ENCOUNTER — Ambulatory Visit (HOSPITAL_COMMUNITY): Payer: Medicare Other

## 2017-04-26 ENCOUNTER — Encounter (HOSPITAL_COMMUNITY): Payer: Self-pay

## 2017-04-26 ENCOUNTER — Other Ambulatory Visit: Payer: Self-pay

## 2017-04-26 DIAGNOSIS — M25512 Pain in left shoulder: Principal | ICD-10-CM

## 2017-04-26 DIAGNOSIS — R29898 Other symptoms and signs involving the musculoskeletal system: Secondary | ICD-10-CM | POA: Diagnosis not present

## 2017-04-26 DIAGNOSIS — M542 Cervicalgia: Secondary | ICD-10-CM | POA: Diagnosis not present

## 2017-04-26 DIAGNOSIS — G8929 Other chronic pain: Secondary | ICD-10-CM | POA: Diagnosis not present

## 2017-04-26 NOTE — Therapy (Signed)
[place], [place], [address] Phone: [phone]   Fax:  [phone]  Occupational Therapy Treatment  Patient Details  Name: KENLEE MALER MRN: [id number] Date of Birth: 1956/01/17 Referring Provider: Dr. Remo Lipps Case   Encounter Date: 04/26/2017  OT End of Session - 04/26/17 1425    Visit Number  7    Number of Visits  8    Date for OT Re-Evaluation  04/29/17    Authorization Type  Medicare A & B    OT Start Time  1347    OT Stop Time  1425    OT Time Calculation (min)  38 min    Activity Tolerance  Patient tolerated treatment well    Behavior During Therapy  [facility] for tasks assessed/performed       Past Medical History:  Diagnosis Date  . Bipolar 1 disorder ([place])    diagnosed 2004  . Hepatitis    Hep C    Past Surgical History:  Procedure Laterality Date  . BACK SURGERY     two-lumbar disc x2  . COLONOSCOPY WITH PROPOFOL N/A 07/25/2014   KDX:IPJASN rectal polyp otherwise normal . hyperplastic polyp. TCS in 07/2024  . ESOPHAGEAL DILATION N/A 07/25/2014   Procedure: ESOPHAGEAL DILATION;  Surgeon: Daneil Dolin, MD;  Location: [facility];  Service: Endoscopy;  Laterality: N/A;  Malony dilators # 56,  . ESOPHAGOGASTRODUODENOSCOPY (EGD) WITH PROPOFOL N/A 07/25/2014   KNL:ZJQB erosive reflux/s/p dilator/HH. +hpylor     There were no vitals filed for this visit.  Subjective Assessment - 04/26/17 1358    Subjective   S: It's just a little sore.     Currently in Pain?  No/denies         [facility] OT Assessment - 04/26/17 1432      Assessment   Medical Diagnosis  left shoulder pain      Precautions   Precautions  None               OT Treatments/Exercises (OP) - 04/26/17 1359      Exercises   Exercises  Shoulder      Shoulder Exercises: Supine   Protraction  PROM;5 reps;Strengthening;12 reps    Protraction Weight (lbs)  1    Horizontal ABduction  PROM;5 reps;Strengthening;12 reps    Horizontal ABduction Weight (lbs)  1    External Rotation  PROM;5 reps;Strengthening;12 reps    External Rotation Weight (lbs)  1    Internal Rotation  PROM;5 reps;Strengthening;12 reps    Internal Rotation Weight (lbs)  1    Flexion  PROM;5 reps;Strengthening;12 reps    Shoulder Flexion Weight (lbs)  1    ABduction  PROM;5 reps;Strengthening;12 reps    Shoulder ABduction Weight (lbs)  1      Shoulder Exercises: Standing   Protraction  Strengthening;10 reps    Protraction Weight (lbs)  1    Horizontal ABduction  Strengthening;10 reps    Horizontal ABduction Weight (lbs)  1    External Rotation  Strengthening;10 reps    External Rotation Weight (lbs)  1    Internal Rotation  Strengthening;10 reps    Internal Rotation Weight (lbs)  1    Flexion  Strengthening;10 reps    Shoulder Flexion Weight (lbs)  1    ABduction  Strengthening;10 reps    Shoulder ABduction Weight (lbs)  1      Shoulder Exercises: ROM/Strengthening   Wall Pushups  10 reps  X to V Arms  10X, 1#     Proximal Shoulder Strengthening, Supine  15X, 1# weight, no rest breaks    Ball on Wall  1' flexion 1' abduction      Manual Therapy   Manual Therapy  Myofascial release    Manual therapy comments  Manual therapy completed prior to exercises.     Myofascial Release  Myofascial release and manual stretching completed to left upper trapezius to decrease fascial restrictions and increase joint mobility and increase joint mobility in a pain free zone.                OT Short Term Goals - 04/07/17 0851      OT SHORT TERM GOAL #1   Title  Pt will be provided with HEP for improved mobility required for ADL performance and use of LUE as non-dominant.     Time  4    Period  Weeks    Status  On-going      OT SHORT TERM GOAL #2   Title  Pt will decrease pain in LUE to 3/10 or less to improve ability to sleep.     Time  4    Period  Weeks    Status  On-going      OT SHORT TERM GOAL #3   Title  Pt will  decrease LUE and cervical neck fascial restrictions to minimal amounts or less to improve mobility required for functional reaching tasks.     Time  4    Period  Weeks    Status  On-going      OT SHORT TERM GOAL #4   Title  Pt will improve LUE A/ROM to [facility] to improve ability to perform dressing tasks reaching overhead and behind back.     Time  4    Period  Weeks    Status  On-going      OT SHORT TERM GOAL #5   Title  Pt will improve LUE strength to 4+/5 to increase ability to ride scooter.     Time  4    Period  Weeks    Status  On-going      OT SHORT TERM GOAL #6   Title  Pt will improve cervical neck ROM to WNL to increase ability to look over shoulder when driving scooter.     Time  4    Period  Weeks    Status  On-going               Plan - 04/26/17 1425    Clinical Impression Statement  A: Pt. took more frequent rest breaks due to dizziness during standing and completed remainder of strengthening seated. He then reported muscle fatigue while seated during 1# exercises. Added wall push ups to increase shoulder stability and strength. Pt. continues Verbal cuing for form and technique.     Plan  P: Reassessment for possible discharge. Send progress note to MD.       Patient will benefit from skilled therapeutic intervention in order to improve the following deficits and impairments:     Visit Diagnosis: Chronic left shoulder pain  Other symptoms and signs involving the musculoskeletal system  Cervicalgia    Problem List Patient Active Problem List   Diagnosis Date Noted  . Pneumothorax, traumatic 04/14/2016  . H. pylori infection 08/22/2014  . Reflux esophagitis   . Dysphagia, pharyngoesophageal phase   . Mucosal abnormality of stomach   . History of colonic polyps   .  Chronic hepatitis C ([place]) 07/09/2014  . Abnormal weight loss 07/09/2014  . History of ETOH abuse 07/09/2014  . Esophageal dysphagia 07/09/2014  . Odynophagia 07/09/2014  . Abnormal ECG  05/28/2014   Ailene Ravel, OTR/L,CBIS  [phone]  04/26/2017, 2:32 PM  [facility] [address] [place], [place], [address] Phone: [phone]   Fax:  [phone]  Name: Bobby York MRN: [id number] Date of Birth: December 22, 1955

## 2017-04-29 ENCOUNTER — Encounter (HOSPITAL_COMMUNITY): Payer: Self-pay | Admitting: Occupational Therapy

## 2017-04-29 ENCOUNTER — Ambulatory Visit (HOSPITAL_COMMUNITY): Payer: Medicare Other | Admitting: Occupational Therapy

## 2017-04-29 DIAGNOSIS — M25512 Pain in left shoulder: Principal | ICD-10-CM

## 2017-04-29 DIAGNOSIS — M542 Cervicalgia: Secondary | ICD-10-CM

## 2017-04-29 DIAGNOSIS — R29898 Other symptoms and signs involving the musculoskeletal system: Secondary | ICD-10-CM

## 2017-04-29 DIAGNOSIS — G8929 Other chronic pain: Secondary | ICD-10-CM

## 2017-04-29 NOTE — Therapy (Signed)
[place], [place], [address] Phone: [phone]   Fax:  [phone]  Occupational Therapy Reassessment, Treatment, and Discharge  Patient Details  Name: Bobby York MRN: [id number] Date of Birth: 1955/05/25 Referring Provider: Dr. Remo Lipps Case   Encounter Date: 04/29/2017  OT End of Session - 04/29/17 1538    Visit Number  8    Number of Visits  8    Date for OT Re-Evaluation  04/29/17    Authorization Type  Medicare A & B    OT Start Time  1432    OT Stop Time  1515    OT Time Calculation (min)  43 min    Activity Tolerance  Patient tolerated treatment well    Behavior During Therapy  [facility] for tasks assessed/performed       Past Medical History:  Diagnosis Date  . Bipolar 1 disorder ([place])    diagnosed 2004  . Hepatitis    Hep C    Past Surgical History:  Procedure Laterality Date  . BACK SURGERY     two-lumbar disc x2  . COLONOSCOPY WITH PROPOFOL N/A 07/25/2014   QQP:YPPJKD rectal polyp otherwise normal . hyperplastic polyp. TCS in 07/2024  . ESOPHAGEAL DILATION N/A 07/25/2014   Procedure: ESOPHAGEAL DILATION;  Surgeon: Daneil Dolin, MD;  Location: [facility];  Service: Endoscopy;  Laterality: N/A;  Malony dilators # 56,  . ESOPHAGOGASTRODUODENOSCOPY (EGD) WITH PROPOFOL N/A 07/25/2014   TOI:ZTIW erosive reflux/s/p dilator/HH. +hpylor     There were no vitals filed for this visit.  Subjective Assessment - 04/29/17 1432    Subjective   S: It's getting easier to put my clothes on and ride my scooter.     Currently in Pain?  Yes    Pain Score  5     Pain Location  Shoulder    Pain Orientation  Left    Pain Descriptors / Indicators  Sore    Pain Type  Chronic pain    Pain Radiating Towards  neck    Pain Onset  More than a month ago    Pain Frequency  Intermittent    Aggravating Factors   movement    Pain Relieving Factors  not moving, rest    Effect of Pain on Daily Activities  min effect on ADLs    Multiple Pain Sites  No         [facility] OT Assessment - 04/29/17 1432      Assessment   Medical Diagnosis  left shoulder pain      Precautions   Precautions  None      Observation/Other Assessments   Focus on Therapeutic Outcomes (FOTO)   61/100      AROM   Overall AROM Comments  Assessed seated, er/IR adducted    AROM Assessment Site  Shoulder    Right/Left Shoulder  Left    Left Shoulder Flexion  180 Degrees 100 previous    Left Shoulder ABduction  180 Degrees 67 previous    Left Shoulder Internal Rotation  90 Degrees same as previous    Left Shoulder External Rotation  90 Degrees 61 previous    Cervical - Right Side Bend  35 21 previous    Cervical - Left Side Bend  20 same as previous    Cervical - Left Rotation  25 31 previous      PROM   Overall PROM Comments  Assessed supine, er/IR adducted  PROM Assessment Site  Shoulder    Left Shoulder Flexion  180 Degrees 148 previous    Left Shoulder ABduction  180 Degrees 135 previous    Left Shoulder Internal Rotation  90 Degrees same as previous    Left Shoulder External Rotation  90 Degrees same as previous      Strength   Overall Strength Comments  Assessed seated, er/IR adducted    Strength Assessment Site  Shoulder    Right/Left Shoulder  Left    Left Shoulder Flexion  5/5 3/5 previous    Left Shoulder ABduction  4/5 3-/5 previous    Left Shoulder Internal Rotation  4+/5 3/5 previous    Left Shoulder External Rotation  4/5 3/5 previous               OT Treatments/Exercises (OP) - 04/29/17 1439      Exercises   Exercises  Shoulder;Neck      Neck Exercises: Stretches   Upper Trapezius Stretch  2 reps;10 seconds each side    Levator Stretch  2 reps;10 seconds right side    Levator Stretch Limitations  Unable to complete to left-pt reports pain along left side of neck with levator stretch to left    Lower Cervical/Upper Thoracic Stretch  2 reps;10 seconds      Shoulder Exercises: Supine   Protraction   PROM;5 reps;Strengthening;10 reps    Protraction Weight (lbs)  2    Horizontal ABduction  PROM;5 reps;Strengthening;10 reps    Horizontal ABduction Weight (lbs)  2    External Rotation  PROM;5 reps;Strengthening;10 reps    External Rotation Weight (lbs)  2    Internal Rotation  PROM;5 reps;Strengthening;10 reps    Internal Rotation Weight (lbs)  2    Flexion  PROM;5 reps;Strengthening;10 reps    Shoulder Flexion Weight (lbs)  2    ABduction  PROM;5 reps;Strengthening;10 reps    Shoulder ABduction Weight (lbs)  2      Shoulder Exercises: Standing   Protraction  Strengthening;10 reps    Protraction Weight (lbs)  2    Horizontal ABduction  Strengthening;10 reps    Horizontal ABduction Weight (lbs)  1    External Rotation  Strengthening;10 reps    External Rotation Weight (lbs)  2    Internal Rotation  Strengthening;10 reps    Internal Rotation Weight (lbs)  2    Flexion  Strengthening;10 reps    Shoulder Flexion Weight (lbs)  2    ABduction  Strengthening;10 reps      Shoulder Exercises: ROM/Strengthening   X to V Arms  10X, 1#     Ball on Wall  1' flexion 1' abduction             OT Education - 04/29/17 1501    Education provided  Yes    Education Details  shoulder strengthening with weights    Person(s) Educated  Patient    Methods  Explanation;Demonstration;Handout    Comprehension  Verbalized understanding;Returned demonstration       OT Short Term Goals - 04/29/17 1458      OT SHORT TERM GOAL #1   Title  Pt will be provided with HEP for improved mobility required for ADL performance and use of LUE as non-dominant.     Time  4    Period  Weeks    Status  Achieved      OT SHORT TERM GOAL #2   Title  Pt will decrease pain in LUE to  3/10 or less to improve ability to sleep.     Time  4    Period  Weeks    Status  Partially Met      OT SHORT TERM GOAL #3   Title  Pt will decrease LUE and cervical neck fascial restrictions to minimal amounts or less to  improve mobility required for functional reaching tasks.     Time  4    Period  Weeks    Status  Partially Met      OT SHORT TERM GOAL #4   Title  Pt will improve LUE A/ROM to [facility] to improve ability to perform dressing tasks reaching overhead and behind back.     Time  4    Period  Weeks    Status  Achieved      OT SHORT TERM GOAL #5   Title  Pt will improve LUE strength to 4+/5 to increase ability to ride scooter.     Time  4    Period  Weeks    Status  Partially Met      OT SHORT TERM GOAL #6   Title  Pt will improve cervical neck ROM to WNL to increase ability to look over shoulder when driving scooter.     Time  4    Period  Weeks    Status  Not Met               Plan - 04/29/17 1538    Clinical Impression Statement  A: Reassessment completed this session, pt has met 2/6 STGs and has partially met an additional 3 STGs. Pt has improved ROM to WNL and has greatly improved shoulder strength. Pt cervical ROM has not improved as pt experiences increased pain with any passive stretching or self-stretching. Discussed progress with pt, pt requests to be discharged at this time.     Plan  P: Discharge pt        Patient will benefit from skilled therapeutic intervention in order to improve the following deficits and impairments:  Decreased activity tolerance, Impaired flexibility, Decreased strength, Decreased range of motion, Pain, Increased fascial restrictions, Impaired UE functional use  Visit Diagnosis: Chronic left shoulder pain  Other symptoms and signs involving the musculoskeletal system  Cervicalgia    Problem List Patient Active Problem List   Diagnosis Date Noted  . Pneumothorax, traumatic 04/14/2016  . H. pylori infection 08/22/2014  . Reflux esophagitis   . Dysphagia, pharyngoesophageal phase   . Mucosal abnormality of stomach   . History of colonic polyps   . Chronic hepatitis C ([place]) 07/09/2014  . Abnormal weight loss 07/09/2014  . History of  ETOH abuse 07/09/2014  . Esophageal dysphagia 07/09/2014  . Odynophagia 07/09/2014  . Abnormal ECG 05/28/2014   Guadelupe Sabin, OTR/L  [phone] 04/29/2017, 3:42 PM  [place] [address] [place], [place], [address] Phone: [phone]   Fax:  [phone]  Name: BHARATH BERNSTEIN MRN: [id number] Date of Birth: 17-Nov-1955    OCCUPATIONAL THERAPY DISCHARGE SUMMARY  Visits from Start of Care: 8  Current functional level related to goals / functional outcomes: See above.    Remaining deficits: Cervical ROM, mild shoulder strength deficits, pain with overhead reaching   Education / Equipment:  HEP Plan: Patient agrees to discharge.  Patient goals were partially met. Patient is being discharged due to being pleased with the current functional level.  ?????

## 2017-04-29 NOTE — Patient Instructions (Signed)
Repeat all exercises 10-15 times, 1-2 times per day. Complete with 1-2 lb weights for shoulder strengthening.   1) Shoulder Protraction    Begin with elbows by your side, slowly "punch" straight out in front of you.      2) Shoulder Flexion  Supine:     Standing:         Begin with arms at your side with thumbs pointed up, slowly raise both arms up and forward towards overhead.               3) Horizontal abduction/adduction  Supine:   Standing:           Begin with arms straight out in front of you, bring out to the side in at "T" shape. Keep arms straight entire time.                 4) Internal & External Rotation    *No band* -Stand with elbows at the side and elbows bent 90 degrees. Move your forearms away from your body, then bring back inward toward the body.     5) Shoulder Abduction  Supine:     Standing:       Lying on your back begin with your arms flat on the table next to your side. Slowly move your arms out to the side so that they go overhead, in a jumping jack or snow angel movement.

## 2017-05-02 DIAGNOSIS — Z9889 Other specified postprocedural states: Secondary | ICD-10-CM | POA: Diagnosis not present

## 2017-05-02 DIAGNOSIS — M792 Neuralgia and neuritis, unspecified: Secondary | ICD-10-CM | POA: Diagnosis not present

## 2017-05-04 DIAGNOSIS — M47813 Spondylosis without myelopathy or radiculopathy, cervicothoracic region: Secondary | ICD-10-CM | POA: Diagnosis not present

## 2017-05-04 DIAGNOSIS — M542 Cervicalgia: Secondary | ICD-10-CM | POA: Diagnosis not present

## 2017-05-04 DIAGNOSIS — M47812 Spondylosis without myelopathy or radiculopathy, cervical region: Secondary | ICD-10-CM | POA: Diagnosis not present

## 2017-05-04 DIAGNOSIS — M4322 Fusion of spine, cervical region: Secondary | ICD-10-CM | POA: Diagnosis not present

## 2017-05-04 DIAGNOSIS — M792 Neuralgia and neuritis, unspecified: Secondary | ICD-10-CM | POA: Diagnosis not present

## 2017-05-04 DIAGNOSIS — G9589 Other specified diseases of spinal cord: Secondary | ICD-10-CM | POA: Diagnosis not present

## 2017-05-04 DIAGNOSIS — M4802 Spinal stenosis, cervical region: Secondary | ICD-10-CM | POA: Diagnosis not present

## 2017-05-12 DIAGNOSIS — M542 Cervicalgia: Secondary | ICD-10-CM | POA: Diagnosis not present

## 2017-05-12 DIAGNOSIS — Z9889 Other specified postprocedural states: Secondary | ICD-10-CM | POA: Diagnosis not present

## 2017-06-15 DIAGNOSIS — M542 Cervicalgia: Secondary | ICD-10-CM | POA: Diagnosis not present

## 2017-06-15 DIAGNOSIS — M4712 Other spondylosis with myelopathy, cervical region: Secondary | ICD-10-CM | POA: Diagnosis not present

## 2017-06-21 DIAGNOSIS — G894 Chronic pain syndrome: Secondary | ICD-10-CM | POA: Diagnosis not present

## 2017-06-21 DIAGNOSIS — Z6825 Body mass index (BMI) 25.0-25.9, adult: Secondary | ICD-10-CM | POA: Diagnosis not present

## 2017-06-21 DIAGNOSIS — E663 Overweight: Secondary | ICD-10-CM | POA: Diagnosis not present

## 2017-07-11 DIAGNOSIS — Z01818 Encounter for other preprocedural examination: Secondary | ICD-10-CM | POA: Diagnosis not present

## 2017-07-13 DIAGNOSIS — F419 Anxiety disorder, unspecified: Secondary | ICD-10-CM | POA: Diagnosis present

## 2017-07-13 DIAGNOSIS — M4712 Other spondylosis with myelopathy, cervical region: Secondary | ICD-10-CM | POA: Diagnosis not present

## 2017-07-13 DIAGNOSIS — F17210 Nicotine dependence, cigarettes, uncomplicated: Secondary | ICD-10-CM | POA: Diagnosis present

## 2017-07-13 DIAGNOSIS — M4322 Fusion of spine, cervical region: Secondary | ICD-10-CM | POA: Diagnosis not present

## 2017-07-13 DIAGNOSIS — F319 Bipolar disorder, unspecified: Secondary | ICD-10-CM | POA: Diagnosis present

## 2017-07-13 DIAGNOSIS — M4802 Spinal stenosis, cervical region: Secondary | ICD-10-CM | POA: Diagnosis present

## 2017-07-21 DIAGNOSIS — Z981 Arthrodesis status: Secondary | ICD-10-CM | POA: Diagnosis not present

## 2017-07-21 DIAGNOSIS — M4712 Other spondylosis with myelopathy, cervical region: Secondary | ICD-10-CM | POA: Diagnosis not present

## 2017-07-21 DIAGNOSIS — M47812 Spondylosis without myelopathy or radiculopathy, cervical region: Secondary | ICD-10-CM | POA: Diagnosis not present

## 2017-07-21 DIAGNOSIS — M4322 Fusion of spine, cervical region: Secondary | ICD-10-CM | POA: Diagnosis not present

## 2017-12-23 DIAGNOSIS — M4712 Other spondylosis with myelopathy, cervical region: Secondary | ICD-10-CM | POA: Diagnosis not present

## 2018-01-25 IMAGING — CT CT CERVICAL SPINE W/O CM
4 of 7 series · 15 of 33 positions shown, 16 images · non-contrast
Comparison: Cervical spine MRI dated 12/15/2010

CLINICAL DATA: 60-year-old male with fall off a scooter.

EXAM:
CT HEAD WITHOUT CONTRAST
CT CERVICAL SPINE WITHOUT CONTRAST
TECHNIQUE: Multidetector CT imaging of the head and cervical spine was
performed following the standard protocol without intravenous
contrast. Multiplanar CT image reconstructions of the cervical spine
were also generated.

[Series 5: coronal soft tissue · coronal · 0.32mm/px · 2 of 76 slices shown]
[im 26/76  bone]
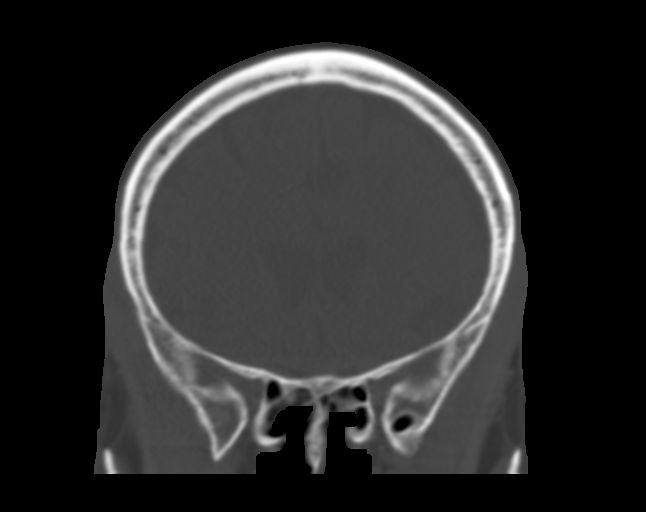
[im 51/76  bone]
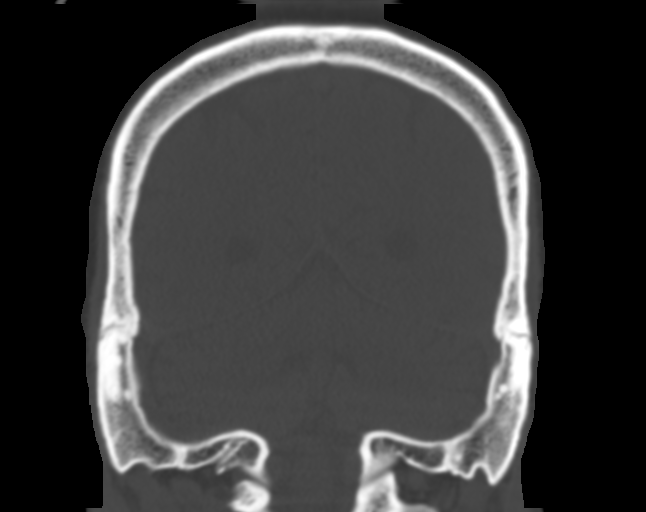

[Series 8: c spine soft · axial · 0.36mm/px · z∈[+1442,+1542]mm · 4 of 84 slices shown]
[im 17/84  soft-tissue]
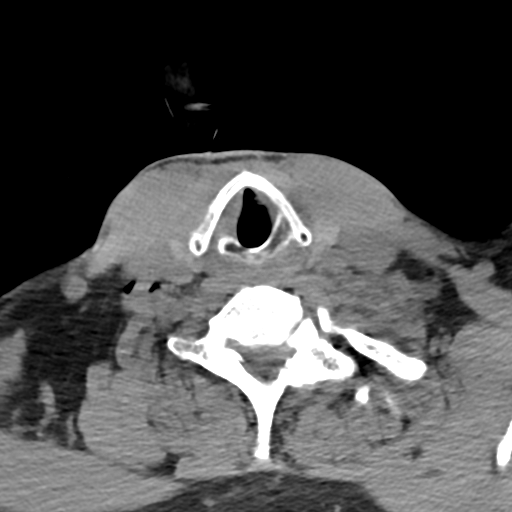
[im 34/84  soft-tissue]
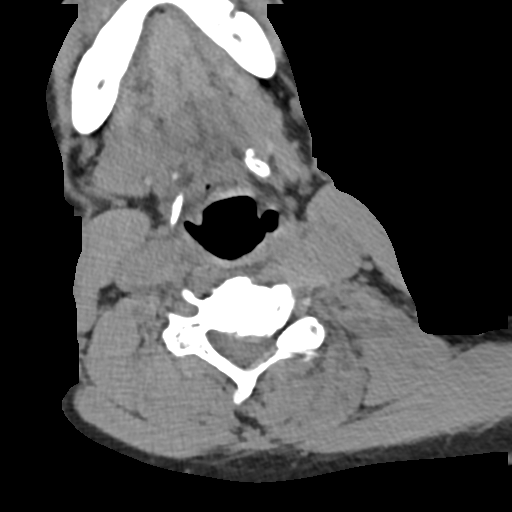
[im 50/84  soft-tissue]
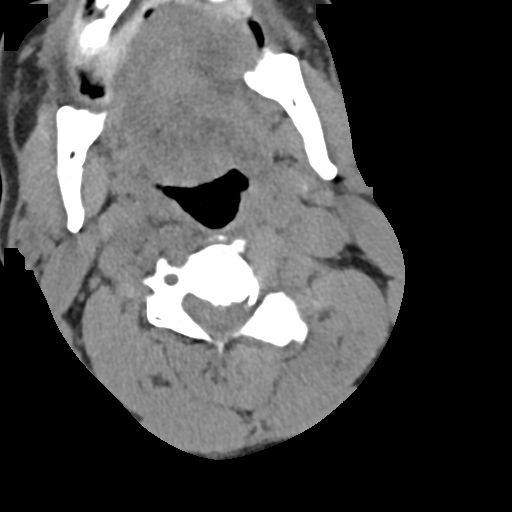
[im 67/84  soft-tissue]
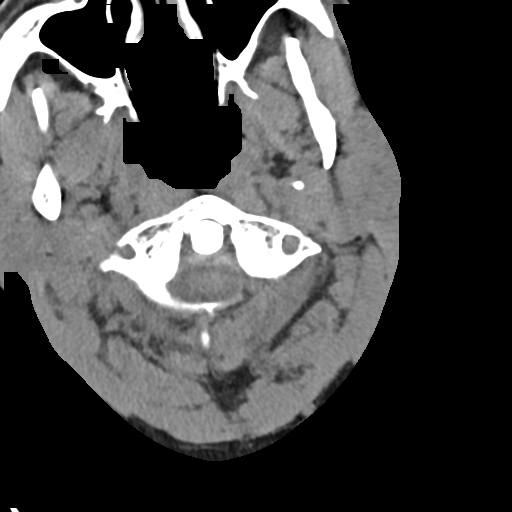

[Series 9: sagittal bone · sagittal · 0.24mm/px · 5 of 61 slices shown]
[im 11/61  bone]
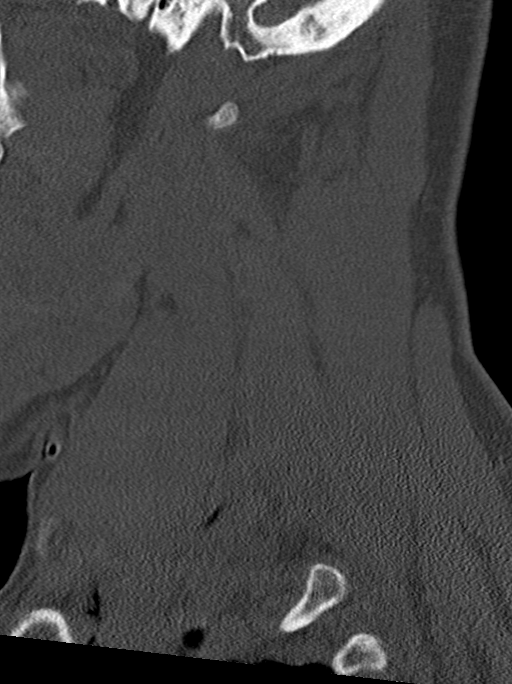
[im 21/61  bone]
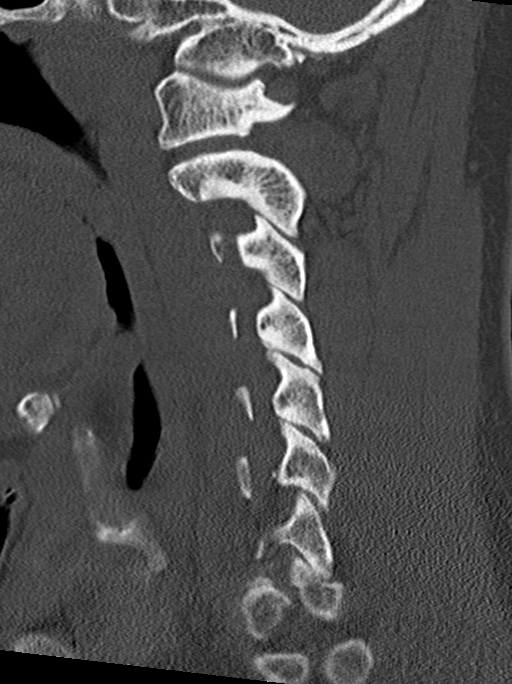
[im 31/61  bone]
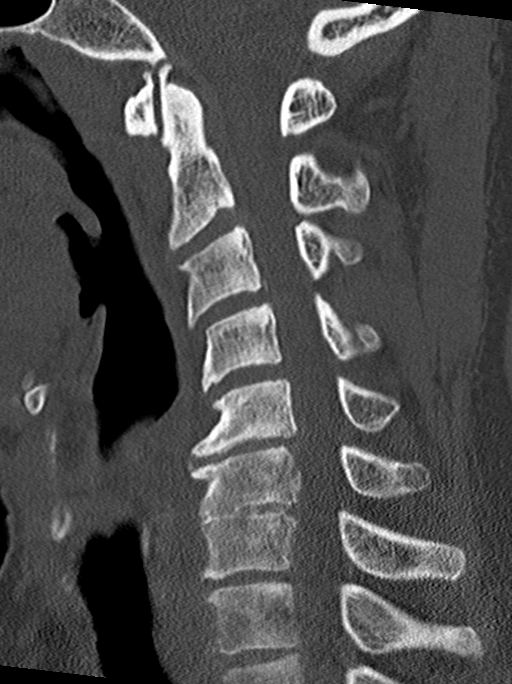
[im 41/61  bone]
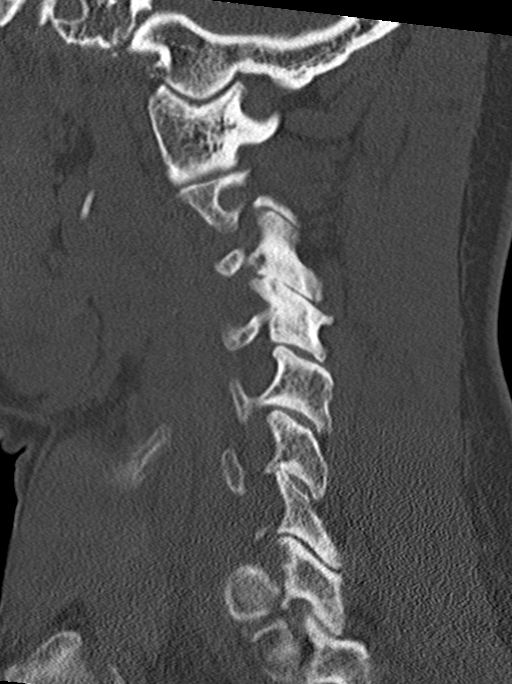
[im 51/61  bone]
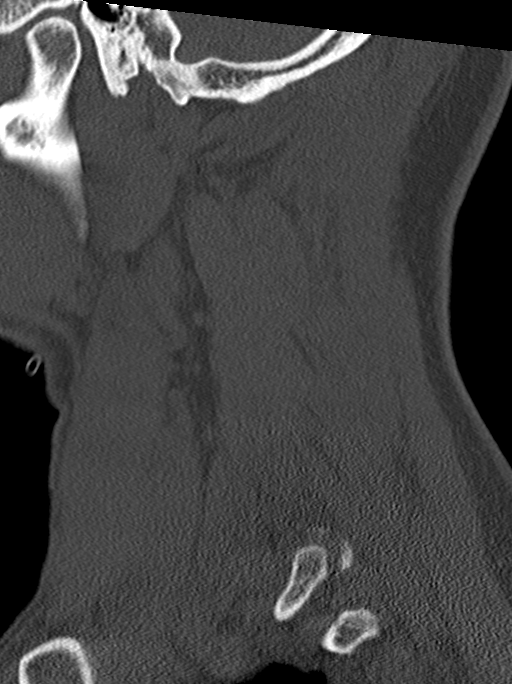

[Series 11: orthogonal bone · axial · 0.21mm/px · z∈[+1423,+1510]mm · 4 of 87 slices shown, 5 images]
[im 18/87  soft-tissue]
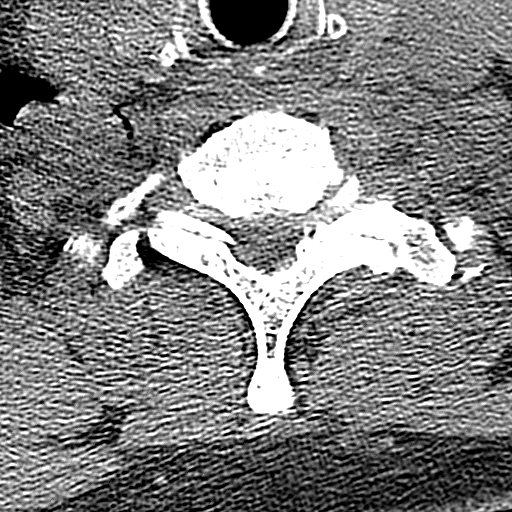
[im 18/87  bone]
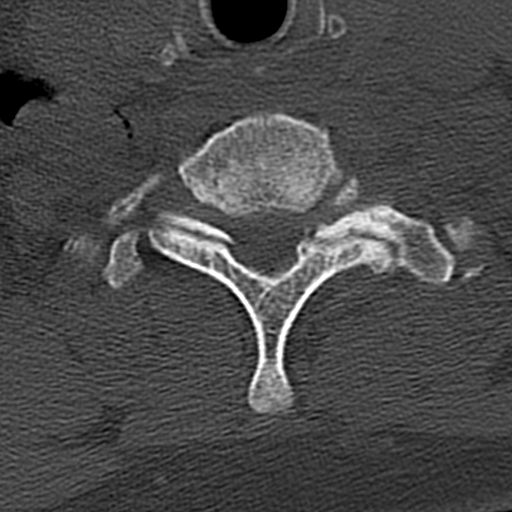
[im 35/87  bone]
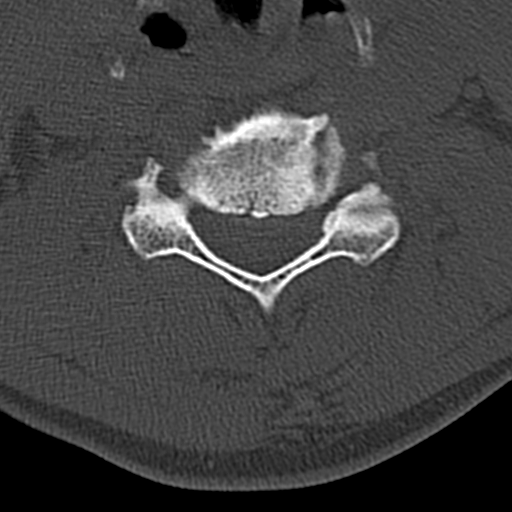
[im 52/87  bone]
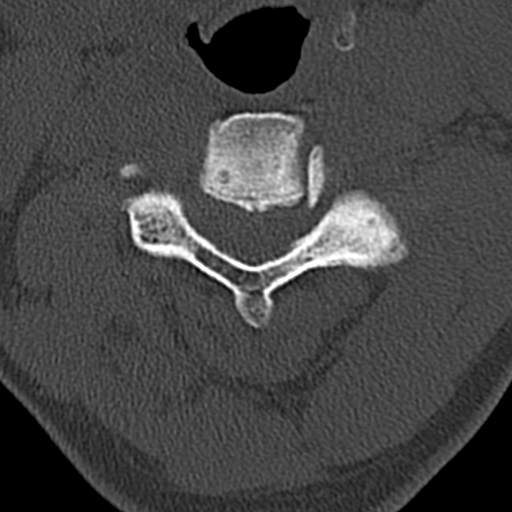
[im 69/87  bone]
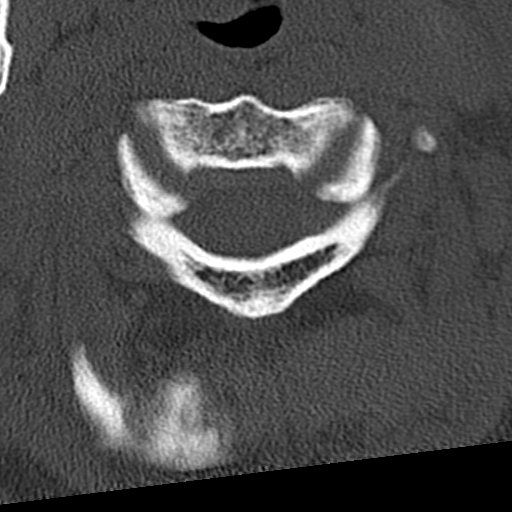

[15 of 33 positions shown; findings below may reference images not displayed]

FINDINGS: CT HEAD FINDINGS

Brain: The ventricles and sulci appropriate size for patient's age.
Mild periventricular and deep white matter chronic microvascular
ischemic changes noted. There is no acute intracranial hemorrhage.
No mass effect or midline shift noted. No extra-axial fluid
collection.

Vascular: No hyperdense vessel or unexpected calcification.

Skull: Normal. Negative for fracture or focal lesion.

Sinuses/Orbits: No acute finding.

Other: None

CT CERVICAL SPINE FINDINGS

Alignment: No acute subluxation. There is mild reversal of normal
cervical lordosis which may be positional or due to muscle spasm.

Skull base and vertebrae: No acute fracture. No primary bone lesion
or focal pathologic process.

Soft tissues and spinal canal: Right supraclavicular soft tissue gas
and partially visualized right pneumothorax. Please refer to the
report of CT of the chest for details.

Disc levels: Multilevel degenerative changes with anterior spurring.
There is partial fusion of the anterior C6-C7 disc space.

Upper chest: Partially visualized right pneumothorax and right
supraclavicular soft tissue air.

Other: None
IMPRESSION: 1. No acute intracranial pathology.
2. No acute/traumatic cervical spine pathology.
3. Partially visualized right pneumothorax and right supraclavicular
soft tissue air. Please refer to the report for the CT of the chest
for the top findings.

## 2018-01-26 IMAGING — DX DG CHEST 1V PORT
1 series · 1 of 1 positions shown · non-contrast
Comparison: 04/14/2016.

CLINICAL DATA: Chest tube.

EXAM:
PORTABLE CHEST 1 VIEW

[chest ap]
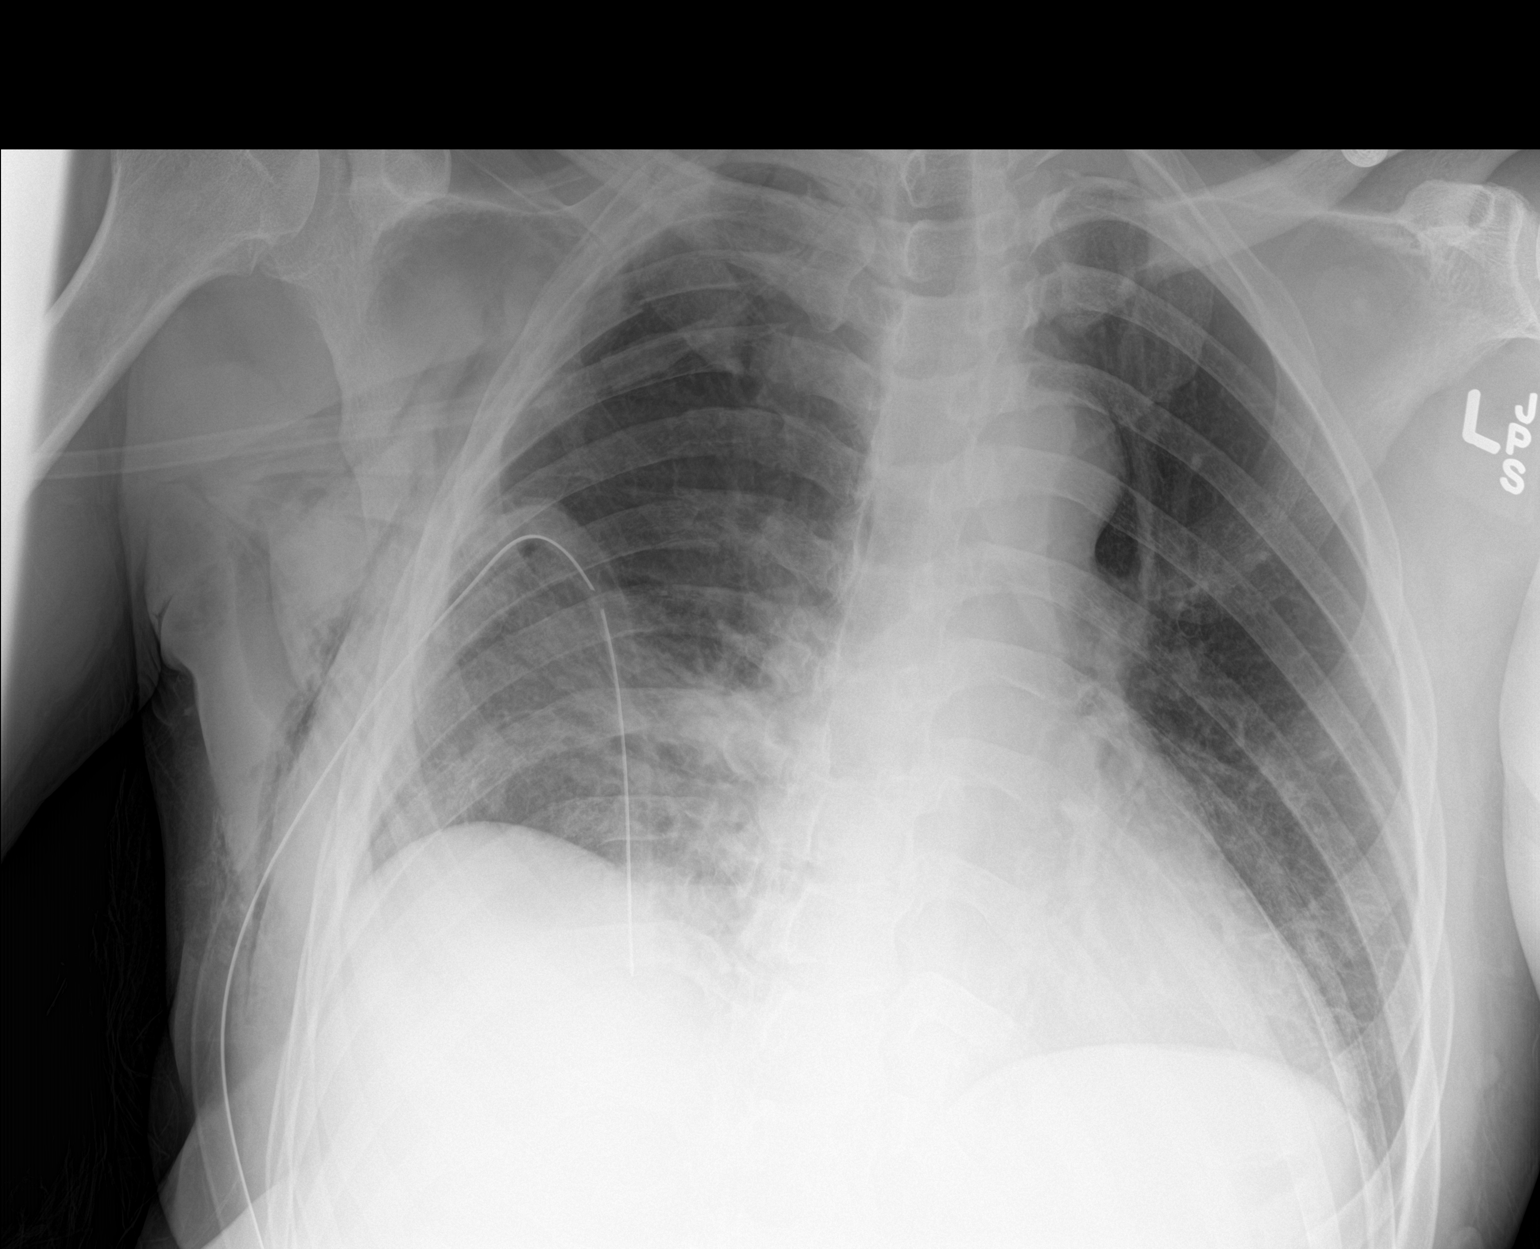

[1 of 1 positions shown; findings below may reference images not displayed]

FINDINGS: Right chest tube in stable position. No pneumothorax. Cardiomegaly.
Normal pulmonary vascularity. Mild atelectasis right lung base.
Right chest wall subcutaneous emphysema again noted. This is
improved from prior exam. Right rib fractures again noted.
IMPRESSION: 1. Right chest tube in stable position. No pneumothorax. Improving
right chest wall subcutaneous emphysema . Right rib fractures again
noted.

2.  Mild right base atelectasis.

3. Stable cardiomegaly.  No pulmonary venous congestion.

## 2018-01-27 IMAGING — DX DG CHEST 1V PORT
1 series · 1 of 1 positions shown · non-contrast
Comparison: Yesterday

CLINICAL DATA: Pneumothorax on the right.  Chest tube

EXAM:
PORTABLE CHEST 1 VIEW

[chest ap]
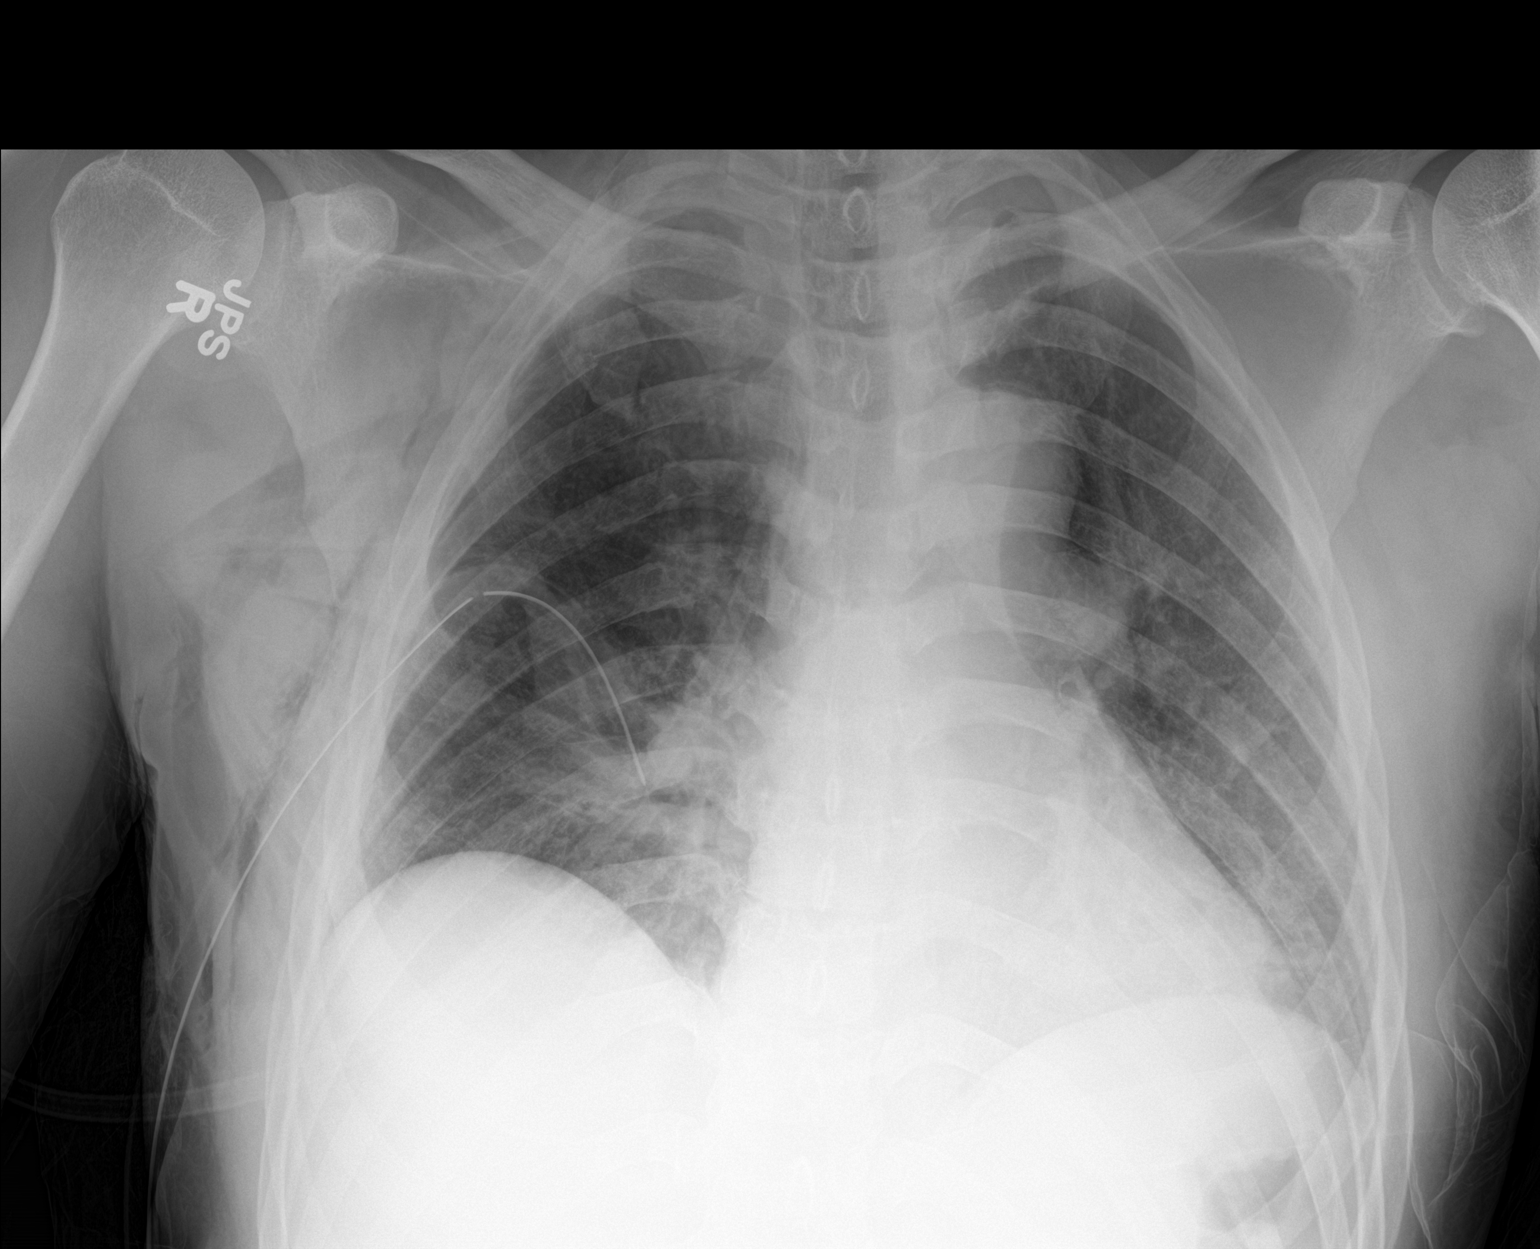

[1 of 1 positions shown; findings below may reference images not displayed]

FINDINGS: Shortened appearance of the right-sided chest tube, but side-port
still overlapping the hemithorax. No visible pneumothorax. Chest
wall emphysema stable. Stable cardiomegaly. Stable presumed
atelectasis.
IMPRESSION: 1. Shorter right-sided chest tube, but still in unremarkable
position. No visible pneumothorax. Stable chest wall emphysema.
2. Patchy atelectasis.

## 2018-03-06 DIAGNOSIS — E663 Overweight: Secondary | ICD-10-CM | POA: Diagnosis not present

## 2018-03-06 DIAGNOSIS — Z1389 Encounter for screening for other disorder: Secondary | ICD-10-CM | POA: Diagnosis not present

## 2018-03-06 DIAGNOSIS — G4700 Insomnia, unspecified: Secondary | ICD-10-CM | POA: Diagnosis not present

## 2018-03-06 DIAGNOSIS — R251 Tremor, unspecified: Secondary | ICD-10-CM | POA: Diagnosis not present

## 2018-03-06 DIAGNOSIS — Z6826 Body mass index (BMI) 26.0-26.9, adult: Secondary | ICD-10-CM | POA: Diagnosis not present

## 2018-03-17 DIAGNOSIS — J069 Acute upper respiratory infection, unspecified: Secondary | ICD-10-CM | POA: Diagnosis not present

## 2018-03-17 DIAGNOSIS — E663 Overweight: Secondary | ICD-10-CM | POA: Diagnosis not present

## 2018-03-17 DIAGNOSIS — Z6826 Body mass index (BMI) 26.0-26.9, adult: Secondary | ICD-10-CM | POA: Diagnosis not present

## 2018-05-03 DIAGNOSIS — Z6826 Body mass index (BMI) 26.0-26.9, adult: Secondary | ICD-10-CM | POA: Diagnosis not present

## 2018-05-03 DIAGNOSIS — F332 Major depressive disorder, recurrent severe without psychotic features: Secondary | ICD-10-CM | POA: Diagnosis not present

## 2018-05-03 DIAGNOSIS — Z1389 Encounter for screening for other disorder: Secondary | ICD-10-CM | POA: Diagnosis not present

## 2018-05-03 DIAGNOSIS — K219 Gastro-esophageal reflux disease without esophagitis: Secondary | ICD-10-CM | POA: Diagnosis not present

## 2018-05-03 DIAGNOSIS — R7309 Other abnormal glucose: Secondary | ICD-10-CM | POA: Diagnosis not present

## 2018-05-03 DIAGNOSIS — B182 Chronic viral hepatitis C: Secondary | ICD-10-CM | POA: Diagnosis not present

## 2018-05-03 DIAGNOSIS — R030 Elevated blood-pressure reading, without diagnosis of hypertension: Secondary | ICD-10-CM | POA: Diagnosis not present

## 2018-05-03 DIAGNOSIS — E663 Overweight: Secondary | ICD-10-CM | POA: Diagnosis not present

## 2018-05-03 DIAGNOSIS — E782 Mixed hyperlipidemia: Secondary | ICD-10-CM | POA: Diagnosis not present

## 2018-05-03 DIAGNOSIS — Z125 Encounter for screening for malignant neoplasm of prostate: Secondary | ICD-10-CM | POA: Diagnosis not present

## 2018-05-03 DIAGNOSIS — Z0001 Encounter for general adult medical examination with abnormal findings: Secondary | ICD-10-CM | POA: Diagnosis not present

## 2018-06-21 DIAGNOSIS — M5136 Other intervertebral disc degeneration, lumbar region: Secondary | ICD-10-CM | POA: Diagnosis not present

## 2018-06-21 DIAGNOSIS — E663 Overweight: Secondary | ICD-10-CM | POA: Diagnosis not present

## 2018-06-21 DIAGNOSIS — Z6826 Body mass index (BMI) 26.0-26.9, adult: Secondary | ICD-10-CM | POA: Diagnosis not present

## 2018-06-21 DIAGNOSIS — S39012A Strain of muscle, fascia and tendon of lower back, initial encounter: Secondary | ICD-10-CM | POA: Diagnosis not present

## 2018-08-17 DIAGNOSIS — Z6825 Body mass index (BMI) 25.0-25.9, adult: Secondary | ICD-10-CM | POA: Diagnosis not present

## 2018-08-17 DIAGNOSIS — E663 Overweight: Secondary | ICD-10-CM | POA: Diagnosis not present

## 2018-08-17 DIAGNOSIS — I951 Orthostatic hypotension: Secondary | ICD-10-CM | POA: Diagnosis not present

## 2018-11-23 DIAGNOSIS — R0602 Shortness of breath: Secondary | ICD-10-CM | POA: Diagnosis not present

## 2018-11-23 DIAGNOSIS — I10 Essential (primary) hypertension: Secondary | ICD-10-CM | POA: Diagnosis not present

## 2018-11-23 DIAGNOSIS — I498 Other specified cardiac arrhythmias: Secondary | ICD-10-CM | POA: Diagnosis not present

## 2019-02-23 DIAGNOSIS — F1910 Other psychoactive substance abuse, uncomplicated: Secondary | ICD-10-CM

## 2019-02-23 HISTORY — DX: Other psychoactive substance abuse, uncomplicated: F19.10

## 2019-04-03 DIAGNOSIS — Z6825 Body mass index (BMI) 25.0-25.9, adult: Secondary | ICD-10-CM | POA: Diagnosis not present

## 2019-04-03 DIAGNOSIS — E7849 Other hyperlipidemia: Secondary | ICD-10-CM | POA: Diagnosis not present

## 2019-04-03 DIAGNOSIS — F419 Anxiety disorder, unspecified: Secondary | ICD-10-CM | POA: Diagnosis not present

## 2019-04-03 DIAGNOSIS — F332 Major depressive disorder, recurrent severe without psychotic features: Secondary | ICD-10-CM | POA: Diagnosis not present

## 2019-04-03 DIAGNOSIS — I951 Orthostatic hypotension: Secondary | ICD-10-CM | POA: Diagnosis not present

## 2019-04-03 DIAGNOSIS — Z1389 Encounter for screening for other disorder: Secondary | ICD-10-CM | POA: Diagnosis not present

## 2019-05-16 DIAGNOSIS — Z6825 Body mass index (BMI) 25.0-25.9, adult: Secondary | ICD-10-CM | POA: Diagnosis not present

## 2019-05-16 DIAGNOSIS — F332 Major depressive disorder, recurrent severe without psychotic features: Secondary | ICD-10-CM | POA: Diagnosis not present

## 2019-05-16 DIAGNOSIS — Z1389 Encounter for screening for other disorder: Secondary | ICD-10-CM | POA: Diagnosis not present

## 2019-05-16 DIAGNOSIS — B182 Chronic viral hepatitis C: Secondary | ICD-10-CM | POA: Diagnosis not present

## 2019-05-16 DIAGNOSIS — Z0001 Encounter for general adult medical examination with abnormal findings: Secondary | ICD-10-CM | POA: Diagnosis not present

## 2019-05-16 DIAGNOSIS — E663 Overweight: Secondary | ICD-10-CM | POA: Diagnosis not present

## 2019-05-31 DIAGNOSIS — H524 Presbyopia: Secondary | ICD-10-CM | POA: Diagnosis not present

## 2019-05-31 DIAGNOSIS — H40013 Open angle with borderline findings, low risk, bilateral: Secondary | ICD-10-CM | POA: Diagnosis not present

## 2019-05-31 DIAGNOSIS — H2513 Age-related nuclear cataract, bilateral: Secondary | ICD-10-CM | POA: Diagnosis not present

## 2019-11-22 ENCOUNTER — Other Ambulatory Visit: Payer: Self-pay

## 2019-11-22 ENCOUNTER — Emergency Department (HOSPITAL_COMMUNITY): Payer: Medicare HMO

## 2019-11-22 ENCOUNTER — Emergency Department (HOSPITAL_COMMUNITY)
Admission: EM | Admit: 2019-11-22 | Discharge: 2019-11-22 | Disposition: A | Discharge status: Home / Self Care | Payer: Medicare HMO | Attending: Emergency Medicine | Admitting: Emergency Medicine

## 2019-11-22 ENCOUNTER — Encounter (HOSPITAL_COMMUNITY): Payer: Self-pay

## 2019-11-22 DIAGNOSIS — R072 Precordial pain: Secondary | ICD-10-CM

## 2019-11-22 DIAGNOSIS — Z6824 Body mass index (BMI) 24.0-24.9, adult: Secondary | ICD-10-CM | POA: Diagnosis not present

## 2019-11-22 DIAGNOSIS — F17210 Nicotine dependence, cigarettes, uncomplicated: Secondary | ICD-10-CM | POA: Diagnosis not present

## 2019-11-22 DIAGNOSIS — R079 Chest pain, unspecified: Secondary | ICD-10-CM | POA: Diagnosis not present

## 2019-11-22 DIAGNOSIS — R0789 Other chest pain: Secondary | ICD-10-CM | POA: Diagnosis not present

## 2019-11-22 LAB — BASIC METABOLIC PANEL
Anion gap: 7 (ref 5–15)
BUN: 12 mg/dL (ref 8–23)
CO2: 27 mmol/L (ref 22–32)
Calcium: 9 mg/dL (ref 8.9–10.3)
Chloride: 102 mmol/L (ref 98–111)
Creatinine, Ser: 0.95 mg/dL (ref 0.61–1.24)
GFR calc Af Amer: >60 mL/min (ref >60)
GFR calc non Af Amer: >60 mL/min (ref >60)
Glucose, Bld: 99 mg/dL (ref 70–99)
Potassium: 3.9 mmol/L (ref 3.5–5.1)
Sodium: 136 mmol/L (ref 135–145)

## 2019-11-22 LAB — CBC WITH DIFFERENTIAL/PLATELET
Abs Immature Granulocytes: 0.02 10*3/uL (ref 0.00–0.07)
Basophils Absolute: 0 10*3/uL (ref 0.0–0.1)
Basophils Relative: 0 %
Eosinophils Absolute: 0.1 10*3/uL (ref 0.0–0.5)
Eosinophils Relative: 2 %
HCT: 40.8 % (ref 39.0–52.0)
Hemoglobin: 13.5 g/dL (ref 13.0–17.0)
Immature Granulocytes: 1 %
Lymphocytes Relative: 34 %
Lymphs Abs: 1.5 10*3/uL (ref 0.7–4.0)
MCH: 31.7 pg (ref 26.0–34.0)
MCHC: 33.1 g/dL (ref 30.0–36.0)
MCV: 95.8 fL (ref 80.0–100.0)
Monocytes Absolute: 0.3 10*3/uL (ref 0.1–1.0)
Monocytes Relative: 7 %
Neutro Abs: 2.4 10*3/uL (ref 1.7–7.7)
Neutrophils Relative %: 56 %
Platelets: 250 10*3/uL (ref 150–400)
RBC: 4.26 MIL/uL (ref 4.22–5.81)
RDW: 12.1 % (ref 11.5–15.5)
WBC: 4.4 10*3/uL (ref 4.0–10.5)
nRBC: 0 % (ref 0.0–0.2)

## 2019-11-22 LAB — TROPONIN I (HIGH SENSITIVITY)
Troponin I (High Sensitivity): 4 ng/L (ref <18)
Troponin I (High Sensitivity): 4 ng/L (ref <18)

## 2019-11-22 NOTE — ED Triage Notes (Signed)
Pt has been passing out for the last few months. Was seen at the [facility] and given a Holter monitor. He has been wearing it for 13 days. Went for a visit at [facility] today and was told to come here for CP. He has been having CP for the last 3 days. Denies any loc

## 2019-11-22 NOTE — ED Provider Notes (Signed)
Emergency Department Provider Note   I have reviewed the triage vital signs and the nursing notes.   HISTORY  Chief Complaint Chest Pain   HPI Bobby York is a 64 y.o. male with PMH reviewed below presents to the ED with CP from [facility] today.  Patient states that he has had 3 days of intermittent pain in the left center of his chest.  Symptoms are intermittent with no clear provoking or modifying factors.  Pain does not radiate.  He describes the pain as feeling like a "toothache" type pain. No SOB of fever. No abdominal pain, nausea, vomiting, or diaphoresis. No similar pain in the past.  He is currently wearing a Holter monitor after several episodes of syncope.  He is seeing a cardiologist at the [place] but was not having chest pain at the time of his initial evaluation.  He is due to mail the monitor back in tomorrow and his last episode of syncope was 1 to 2 months ago. No near syncope symptoms. No cough, congestion, or sick contacts.    Past Medical History:  Diagnosis Date   Bipolar 1 disorder ([place])    diagnosed 2004   Hepatitis    Hep C    Patient Active Problem List   Diagnosis Date Noted   Pneumothorax, traumatic 04/14/2016   H. pylori infection 08/22/2014   Reflux esophagitis    Dysphagia, pharyngoesophageal phase    Mucosal abnormality of stomach    History of colonic polyps    Chronic hepatitis C ([place]) 07/09/2014   Abnormal weight loss 07/09/2014   History of ETOH abuse 07/09/2014   Esophageal dysphagia 07/09/2014   Odynophagia 07/09/2014   Abnormal ECG 05/28/2014    Past Surgical History:  Procedure Laterality Date   BACK SURGERY     two-lumbar disc x2   COLONOSCOPY WITH PROPOFOL N/A 07/25/2014   VOZ:DGUYQI rectal polyp otherwise normal . hyperplastic polyp. TCS in 07/2024   ESOPHAGEAL DILATION N/A 07/25/2014   Procedure: ESOPHAGEAL DILATION;  Surgeon: Daneil Dolin, MD;  Location: [facility];  Service: Endoscopy;  Laterality:  N/A;  Malony dilators # 56,   ESOPHAGOGASTRODUODENOSCOPY (EGD) WITH PROPOFOL N/A 07/25/2014   HKV:QQVZ erosive reflux/s/p dilator/HH. +hpylor     Allergies Patient has no known allergies.  Family History  Problem Relation Age of Onset   Heart disease Mother    Diabetes Mother    Prostate cancer Father    Liver disease Neg Hx    Colon cancer Neg Hx     Social History Social History   Tobacco Use   Smoking status: Current Every Day Smoker    Packs/day: 0.02    Years: 40.00    Pack years: 0.80    Types: Cigarettes    Start date: 05/28/1970   Smokeless tobacco: Never Used   Tobacco comment: 3 cigarettes daily  Substance Use Topics   Alcohol use: Yes    Alcohol/week: 0.0 standard drinks    Comment: Occ   Drug use: Yes    Types: Marijuana    Comment: 04/13/16    Review of Systems  Constitutional: No fever/chills Eyes: No visual changes. ENT: No sore throat. Cardiovascular: Positive chest pain. Respiratory: Denies shortness of breath. Gastrointestinal: No abdominal pain.  No nausea, no vomiting.  No diarrhea.  No constipation. Genitourinary: Negative for dysuria. Musculoskeletal: Negative for back pain. Skin: Negative for rash. Neurological: Negative for headaches, focal weakness or numbness.  10-point ROS otherwise negative.  ____________________________________________  PHYSICAL EXAM:  VITAL SIGNS: ED Triage Vitals  Enc Vitals Group     BP 11/22/19 1032 (!) 148/107     Pulse Rate 11/22/19 1032 81     Resp 11/22/19 1032 12     Temp 11/22/19 1032 98.8 F (37.1 C)     Temp Source 11/22/19 1032 Oral     SpO2 11/22/19 1032 100 %     Weight 11/22/19 1033 177 lb (80.3 kg)     Height 11/22/19 1033 5\' 11"  (1.803 m)   Constitutional: Alert and oriented. Well appearing and in no acute distress. Eyes: Conjunctivae are normal.  Head: Atraumatic. Nose: No congestion/rhinnorhea. Mouth/Throat: Mucous membranes are moist.  Neck: No stridor.    Cardiovascular: Normal rate, regular rhythm. Good peripheral circulation. Grossly normal heart sounds.   Respiratory: Normal respiratory effort.  No retractions. Lungs CTAB. Gastrointestinal: Soft and nontender. No distention.  Musculoskeletal: No gross deformities of extremities. Neurologic:  Normal speech and language. Skin:  Skin is warm, dry and intact. No rash noted.   ____________________________________________   LABS (all labs ordered are listed, but only abnormal results are displayed)  Labs Reviewed  BASIC METABOLIC PANEL  CBC WITH DIFFERENTIAL/PLATELET  TROPONIN I (HIGH SENSITIVITY)  TROPONIN I (HIGH SENSITIVITY)   ____________________________________________  EKG   EKG Interpretation  Date/Time:  Thursday November 22 2019 10:24:40 EDT Ventricular Rate:  83 PR Interval:  138 QRS Duration: 86 QT Interval:  388 QTC Calculation: 455 R Axis:   68 Text Interpretation: Sinus rhythm with Premature atrial complexes with Abberant conduction Otherwise normal ECG No STEMI Confirmed by Nanda Quinton [phone]) on 11/22/2019 10:51:34 AM       ____________________________________________  RADIOLOGY  CXR reviewed.   ____________________________________________   PROCEDURES  Procedure(s) performed:   Procedures  None ____________________________________________   INITIAL IMPRESSION / ASSESSMENT AND PLAN / ED COURSE  Pertinent labs & imaging results that were available during my care of the patient were reviewed by me and considered in my medical decision making (see chart for details).   Patient presents to the emergency department for evaluation of chest pain.  EKG is reassuring.  EKG from [place] shows significant artifact due to the patient's underlying tremor.  No acute ischemic change today.  Plan for serial troponins, chest x-ray, and likely d/c if negative. Patient is low risk by HEART score. Doubt PE or aortic pathology clinically.   Labs and imaging  reviewed.  At this time, I do not feel there is any life-threatening condition present. I have reviewed and discussed all results (EKG, imaging, lab, urine as appropriate), exam findings with patient. I have reviewed nursing notes and appropriate previous records.  I feel the patient is safe to be discharged home without further emergent workup. Discussed usual and customary return precautions. Patient and family (if present) verbalize understanding and are comfortable with this plan.  Patient will follow-up with their primary care provider. If they do not have a primary care provider, information for follow-up has been provided to them. All questions have been answered.  ____________________________________________  FINAL CLINICAL IMPRESSION(S) / ED DIAGNOSES  Final diagnoses:  Precordial chest pain    Note:  This document was prepared using Dragon voice recognition software and may include unintentional dictation errors.  Nanda Quinton, MD, [facility] Emergency Medicine    Reinhold Rickey, [facility], MD 11/28/19 2008

## 2019-11-22 NOTE — Discharge Instructions (Addendum)
You were seen in the ED today with chest pain. Your heart enzymes were normal and you do not appear to be having a heart attack. Please call your Cardiologist at the [facility] to make them aware of your symptoms. They may want to do additional testing to make sure your heart is healthy. Please return to the ED immediately if you develop new or suddenly worsening symptoms.

## 2020-02-26 DIAGNOSIS — D352 Benign neoplasm of pituitary gland: Secondary | ICD-10-CM | POA: Diagnosis not present

## 2020-07-15 ENCOUNTER — Encounter (HOSPITAL_COMMUNITY): Payer: Self-pay | Admitting: *Deleted

## 2020-07-15 ENCOUNTER — Emergency Department (HOSPITAL_COMMUNITY): Payer: Medicare HMO

## 2020-07-15 ENCOUNTER — Emergency Department (HOSPITAL_COMMUNITY)
Admission: EM | Admit: 2020-07-15 | Discharge: 2020-07-15 | Disposition: A | Discharge status: Home / Self Care | Payer: Medicare HMO | Attending: Emergency Medicine | Admitting: Emergency Medicine

## 2020-07-15 ENCOUNTER — Other Ambulatory Visit: Payer: Self-pay

## 2020-07-15 DIAGNOSIS — R110 Nausea: Secondary | ICD-10-CM | POA: Diagnosis not present

## 2020-07-15 DIAGNOSIS — R0602 Shortness of breath: Secondary | ICD-10-CM | POA: Insufficient documentation

## 2020-07-15 DIAGNOSIS — Z20822 Contact with and (suspected) exposure to covid-19: Secondary | ICD-10-CM | POA: Diagnosis not present

## 2020-07-15 DIAGNOSIS — F17210 Nicotine dependence, cigarettes, uncomplicated: Secondary | ICD-10-CM | POA: Insufficient documentation

## 2020-07-15 LAB — CBC WITH DIFFERENTIAL/PLATELET
Abs Immature Granulocytes: 0.01 10*3/uL (ref 0.00–0.07)
Basophils Absolute: 0 10*3/uL (ref 0.0–0.1)
Basophils Relative: 1 %
Eosinophils Absolute: 0.1 10*3/uL (ref 0.0–0.5)
Eosinophils Relative: 2 %
HCT: 42.8 % (ref 39.0–52.0)
Hemoglobin: 14.4 g/dL (ref 13.0–17.0)
Immature Granulocytes: 0 %
Lymphocytes Relative: 45 %
Lymphs Abs: 1.8 10*3/uL (ref 0.7–4.0)
MCH: 32.3 pg (ref 26.0–34.0)
MCHC: 33.6 g/dL (ref 30.0–36.0)
MCV: 96 fL (ref 80.0–100.0)
Monocytes Absolute: 0.4 10*3/uL (ref 0.1–1.0)
Monocytes Relative: 9 %
Neutro Abs: 1.7 10*3/uL (ref 1.7–7.7)
Neutrophils Relative %: 43 %
Platelets: 250 10*3/uL (ref 150–400)
RBC: 4.46 MIL/uL (ref 4.22–5.81)
RDW: 11.9 % (ref 11.5–15.5)
WBC: 4 10*3/uL (ref 4.0–10.5)
nRBC: 0 % (ref 0.0–0.2)

## 2020-07-15 LAB — COMPREHENSIVE METABOLIC PANEL
ALT: 15 U/L (ref 0–44)
AST: 25 U/L (ref 15–41)
Albumin: 4.1 g/dL (ref 3.5–5.0)
Alkaline Phosphatase: 64 U/L (ref 38–126)
Anion gap: 8 (ref 5–15)
BUN: 14 mg/dL (ref 8–23)
CO2: 23 mmol/L (ref 22–32)
Calcium: 9.3 mg/dL (ref 8.9–10.3)
Chloride: 105 mmol/L (ref 98–111)
Creatinine, Ser: 1.09 mg/dL (ref 0.61–1.24)
GFR, Estimated: >60 mL/min (ref >60)
Glucose, Bld: 98 mg/dL (ref 70–99)
Potassium: 3.8 mmol/L (ref 3.5–5.1)
Sodium: 136 mmol/L (ref 135–145)
Total Bilirubin: 0.6 mg/dL (ref 0.3–1.2)
Total Protein: 7.4 g/dL (ref 6.5–8.1)

## 2020-07-15 LAB — RESP PANEL BY RT-PCR (FLU A&B, COVID) ARPGX2
Influenza A by PCR: NEGATIVE
Influenza B by PCR: NEGATIVE
SARS Coronavirus 2 by RT PCR: NEGATIVE

## 2020-07-15 LAB — D-DIMER, QUANTITATIVE: D-Dimer, Quant: 0.36 ug/mL-FEU (ref 0.00–0.50)

## 2020-07-15 LAB — TROPONIN I (HIGH SENSITIVITY)
Troponin I (High Sensitivity): 3 ng/L (ref <18)
Troponin I (High Sensitivity): 4 ng/L (ref <18)

## 2020-07-15 MED ORDER — ASPIRIN 81 MG PO CHEW: 324.0000 mg | CHEWABLE_TABLET | Freq: Once | ORAL | Status: DC

## 2020-07-15 MED ORDER — ONDANSETRON HCL 4 MG/2ML IJ SOLN
4.0000 mg | Freq: Once | INTRAMUSCULAR | Status: AC
  Administered 2020-07-15: 4 mg via INTRAVENOUS
  Filled 2020-07-15: qty 2

## 2020-07-15 NOTE — ED Notes (Signed)
Pt c/o sudden sob and nausea ealier this am. Pt having tachypnea, c/o sob. Encouraged to take slow deep breaths. Comfort measures given. Denies v/d/abd pain. abd soft. A/o. Pt legs shaking.

## 2020-07-15 NOTE — Discharge Instructions (Addendum)
You came to the emerge department today to be evaluated for your shortness of breath and nausea.  Your physical exam and lab work were reassuring.  Please follow-up with your primary care provider.   Get help right away if: Your shortness of breath gets worse. You have shortness of breath when you are resting. You feel light-headed or you faint. You have a cough that is not controlled with medicines. You cough up blood. You have pain with breathing. You have pain in your chest, arms, shoulders, or abdomen. You have a fever. You cannot walk up stairs or exercise the way that you normally do.

## 2020-07-15 NOTE — ED Provider Notes (Signed)
[facility] EMERGENCY DEPARTMENT Provider Note   CSN: [id number] Arrival date & time: 07/15/20  6962     History Chief Complaint  Patient presents with  . Shortness of Breath    Bobby York is a 65 y.o. male with a history of bipolar disorder, hepatitis C, alcohol use, cocaine use, GERD.  Patient presents with chief complaint of shortness of breath and nausea.  Patient reports that his shortness of breath and nausea started yesterday.  Both symptoms are intermittent.  Patient denies any alleviating or aggravating factors.  Patient denies any fevers, chills, abdominal pain, vomiting, cough, chest pain, palpitations, leg swelling.  Patient denies any known sick contacts.  Patient has been vaccinated and received booster for COVID-19.  Patient has been vaccinated for influenza.  HPI     Past Medical History:  Diagnosis Date  . Bipolar 1 disorder ([place])    diagnosed 2004  . Hepatitis    Hep C    Patient Active Problem List   Diagnosis Date Noted  . Pneumothorax, traumatic 04/14/2016  . H. pylori infection 08/22/2014  . Reflux esophagitis   . Dysphagia, pharyngoesophageal phase   . Mucosal abnormality of stomach   . History of colonic polyps   . Chronic hepatitis C ([place]) 07/09/2014  . Abnormal weight loss 07/09/2014  . History of ETOH abuse 07/09/2014  . Esophageal dysphagia 07/09/2014  . Odynophagia 07/09/2014  . Abnormal ECG 05/28/2014    Past Surgical History:  Procedure Laterality Date  . BACK SURGERY     two-lumbar disc x2  . CLAVICLE SURGERY    . COLONOSCOPY WITH PROPOFOL N/A 07/25/2014   XBM:WUXLKG rectal polyp otherwise normal . hyperplastic polyp. TCS in 07/2024  . ESOPHAGEAL DILATION N/A 07/25/2014   Procedure: ESOPHAGEAL DILATION;  Surgeon: Daneil Dolin, MD;  Location: [facility];  Service: Endoscopy;  Laterality: N/A;  Malony dilators # 56,  . ESOPHAGOGASTRODUODENOSCOPY (EGD) WITH PROPOFOL N/A 07/25/2014   MWN:UUVO erosive reflux/s/p dilator/HH. +hpylor   .  NECK SURGERY         Family History  Problem Relation Age of Onset  . Heart disease Mother   . Diabetes Mother   . Prostate cancer Father   . Liver disease Neg Hx   . Colon cancer Neg Hx     Social History   Tobacco Use  . Smoking status: Current Every Day Smoker    Packs/day: 0.40    Years: 40.00    Pack years: 16.00    Types: Cigarettes    Start date: 05/28/1970  . Smokeless tobacco: Never Used  . Tobacco comment: 4 cigarettes daily   Substance Use Topics  . Alcohol use: Not Currently    Alcohol/week: 0.0 standard drinks  . Drug use: Yes    Types: Marijuana    Home Medications Prior to Admission medications   Medication Sig Start Date End Date Taking? Authorizing Provider  atorvastatin (LIPITOR) 20 MG tablet Take 1 tablet by mouth daily. 04/29/20  Yes [provider]  benztropine (COGENTIN) 1 MG tablet Take 0.5 mg by mouth daily. 03/20/20  Yes [provider]  QUEtiapine (SEROQUEL) 100 MG tablet Take 50 mg by mouth at bedtime. 04/30/20  Yes [provider]  traZODone (DESYREL) 50 MG tablet Take 50 mg by mouth at bedtime as needed for sleep. 04/30/20  Yes [provider]  diclofenac sodium (VOLTAREN) 1 % GEL Apply 4 g topically 4 (four) times daily. Patient not taking: No sig reported 06/18/16  Triplett, Tammy, PA-C  HYDROcodone-acetaminophen (NORCO/VICODIN) 5-325 MG tablet One tablet by mouth every six hours as needed for pain.  Seven day limit Patient not taking: No sig reported 11/02/16   Carole Civil, MD  meloxicam (MOBIC) 7.5 MG tablet Take 1 tablet (7.5 mg total) by mouth daily. Patient not taking: No sig reported 12/13/16   Carole Civil, MD    Allergies    Patient has no known allergies.  Review of Systems   Review of Systems  Constitutional: Negative for chills and fever.  HENT: Negative for congestion, rhinorrhea and sore throat.   Respiratory: Positive for shortness of breath. Negative for cough, chest tightness  and wheezing.   Cardiovascular: Negative for chest pain, palpitations and leg swelling.  Gastrointestinal: Positive for nausea. Negative for abdominal distention, abdominal pain, anal bleeding, blood in stool, constipation, diarrhea and vomiting.  Genitourinary: Negative for difficulty urinating, dysuria, frequency and hematuria.  Musculoskeletal: Negative for back pain, myalgias and neck pain.  Skin: Negative for color change and rash.  Neurological: Negative for dizziness, syncope, light-headedness and headaches.  Psychiatric/Behavioral: Negative for confusion.    Physical Exam Updated Vital Signs BP (!) 158/104 (BP Location: Left Arm)   Pulse 70   Temp 98 F (36.7 C) (Oral)   Resp (!) 28   Ht 5\' 11"  (1.803 m)   Wt 79.4 kg   SpO2 100%   BMI 24.41 kg/m   Physical Exam Vitals and nursing note reviewed.  Constitutional:      General: He is not in acute distress.    Appearance: He is not ill-appearing, toxic-appearing or diaphoretic.  HENT:     Head: Normocephalic.  Eyes:     General: No scleral icterus.       Right eye: No discharge.        Left eye: No discharge.  Cardiovascular:     Rate and Rhythm: Normal rate.     Heart sounds: Normal heart sounds.  Pulmonary:     Effort: Pulmonary effort is normal. Tachypnea present. No bradypnea or respiratory distress.     Breath sounds: Normal breath sounds. No stridor.     Comments: Tachypnea at 28; patient able speak in full complete sentences without difficulty.  Oxygen saturation 100% on room air. Abdominal:     General: Bowel sounds are normal. There is no distension. There are no signs of injury.     Palpations: Abdomen is soft. There is no mass or pulsatile mass.     Tenderness: There is no abdominal tenderness. There is no guarding or rebound.     Hernia: There is no hernia in the umbilical area or ventral area.  Musculoskeletal:     Cervical back: Neck supple.     Right lower leg: No tenderness. No edema.     Left  lower leg: No tenderness. No edema.  Skin:    General: Skin is warm and dry.     Coloration: Skin is not cyanotic or pale.  Neurological:     General: No focal deficit present.     Mental Status: He is alert.     GCS: GCS eye subscore is 4. GCS verbal subscore is 5. GCS motor subscore is 6.  Psychiatric:        Behavior: Behavior is cooperative.     ED Results / Procedures / Treatments   Labs (all labs ordered are listed, but only abnormal results are displayed) Labs Reviewed  RESP PANEL BY RT-PCR (FLU A&B, COVID) ARPGX2  CBC WITH DIFFERENTIAL/PLATELET  COMPREHENSIVE METABOLIC PANEL  D-DIMER, QUANTITATIVE  TROPONIN I (HIGH SENSITIVITY)  TROPONIN I (HIGH SENSITIVITY)    EKG EKG Interpretation  Date/Time:  Tuesday Jul 15 2020 09:19:50 EDT Ventricular Rate:  78 PR Interval:  150 QRS Duration: 90 QT Interval:  413 QTC Calculation: 471 R Axis:   84 Text Interpretation: Sinus rhythm Borderline right axis deviation Confirmed by Fredia Sorrow [phone]) on 07/15/2020 9:50:42 AM   Radiology DG Chest Portable 1 View  Result Date: 07/15/2020 CLINICAL DATA:  Shortness of breath EXAM: PORTABLE CHEST 1 VIEW COMPARISON:  11/22/2019 FINDINGS: Artifact from EKG leads. Normal heart size and mediastinal contours. No acute infiltrate or edema. No effusion or pneumothorax. No acute osseous findings. Remote right upper rib fractures. IMPRESSION: No active disease. Electronically Signed   By: Monte Fantasia M.D.   On: 07/15/2020 10:27    Procedures Procedures   Medications Ordered in ED Medications  ondansetron (ZOFRAN) injection 4 mg (4 mg Intravenous Given 07/15/20 1000)    ED Course  I have reviewed the triage vital signs and the nursing notes.  Pertinent labs & imaging results that were available during my care of the patient were reviewed by me and considered in my medical decision making (see chart for details).    MDM Rules/Calculators/A&P                          Alert  65 year old male no acute distress, nontoxic-appearing.  Patient presents with chief complaint of short of breath and nausea.  Patient reports that most of the symptoms started yesterday.  Patient denies any chest pain or discomfort.  Patient able to speak in full pretenses without difficulty.  Oxygen saturation 100% on room air.  Patient's lungs clear to auscultation bilaterally.  Patient abdomen soft, nondistended, nontender.  Will obtain chest x-ray, EKG, D-dimer, troponin, respiratory panel, CBC, and CMP.  Chest x-ray shows no active cardiopulmonary disease.   Respiratory panel negative for COVID-19 and influenza. D-dimer normal limits, low suspicion for PE. CBC unremarkable. CMP unremarkable. EKG shows normal sinus rhythm, troponin 4.  Patient has not had any chest pain and continues to be chest pain-free throughout his emergency department stay.  Low suspicion for ACS at this time.  On serial reexamination patient reports improvement in his oxygen saturation.  Patient hemodynamic stable.  Will have patient follow-up with his primary care provider for hypertension.  Discussed results, findings, treatment and follow up. Patient advised of return precautions. Patient verbalized understanding and agreed with plan.   Final Clinical Impression(s) / ED Diagnoses Final diagnoses:  SOB (shortness of breath)    Rx / DC Orders ED Discharge Orders    None       Dyann Ruddle 07/15/20 2258    Fredia Sorrow, MD 07/19/20 [phone]

## 2020-07-15 NOTE — ED Notes (Signed)
Pt states the right sided shakiness is his normal and takes meds for it. lpt able to complete sentences and talk on phone.

## 2020-07-15 NOTE — ED Triage Notes (Signed)
Pt c/o SOB and and nausea since yesterday. Denies sick contacts, cough, fever, abdominal pain, vomiting, diarrhea.

## 2020-07-23 DIAGNOSIS — Z0001 Encounter for general adult medical examination with abnormal findings: Secondary | ICD-10-CM | POA: Diagnosis not present

## 2020-07-23 DIAGNOSIS — E7849 Other hyperlipidemia: Secondary | ICD-10-CM | POA: Diagnosis not present

## 2020-07-23 DIAGNOSIS — F329 Major depressive disorder, single episode, unspecified: Secondary | ICD-10-CM | POA: Diagnosis not present

## 2020-07-23 DIAGNOSIS — I951 Orthostatic hypotension: Secondary | ICD-10-CM | POA: Diagnosis not present

## 2020-07-23 DIAGNOSIS — B182 Chronic viral hepatitis C: Secondary | ICD-10-CM | POA: Diagnosis not present

## 2020-07-23 DIAGNOSIS — Z6824 Body mass index (BMI) 24.0-24.9, adult: Secondary | ICD-10-CM | POA: Diagnosis not present

## 2020-08-13 NOTE — Progress Notes (Signed)
CARDIOLOGY CONSULT NOTE       Patient ID: Bobby York MRN: [id number] DOB/AGE: April 10, 1955 65 y.o.  Admit date: (Not on file) Referring Physician: [name] Primary Physician: [name], MD Primary Cardiologist: New Reason for Consultation: dyspnea   Active Problems:   * No active hospital problems. *   HPI:  65 y.o. referred by Dr [name] for  dyspnea with ER visit 07/15/20 History of Bipolar disease and hepatitis, ETOH/Cocaine abuse/use. No history of vascular disease or CAD. On statin for HLD LDL 135 last year. Seen in ED 07/15/20 with dyspnea and nausea Sats fine CXR NAD COVID/Influenza negative D dimer normal troponin negative Hct 42.8  and no acute ECG changes History of gastritis with EGD 2016 and esophageal dilation   He indicated some tightness in the exam room and ECG was normal On Cogentin for tremors worse in right arm/hand Dose lowered recently   He was in the [employer]. Sees [facility] Son deceased and daughter works in school system in [place] Has 3 older grandchildren  BP seems to run low with some postural symptoms when taking bipolar meds   ROS All other systems reviewed and negative except as noted above  Past Medical History:  Diagnosis Date   Bipolar 1 disorder ([place])    diagnosed 2004   Hepatitis    Hep C    Family History  Problem Relation Age of Onset   Heart disease Mother    Diabetes Mother    Prostate cancer Father    Liver disease Neg Hx    Colon cancer Neg Hx     Social History   Socioeconomic History   Marital status: Married    Spouse name: Not on file   Number of children: Not on file   Years of education: Not on file   Highest education level: Not on file  Occupational History   Not on file  Tobacco Use   Smoking status: Every Day    Packs/day: 0.40    Years: 40.00    Pack years: 16.00    Types: Cigarettes    Start date: 05/28/1970   Smokeless tobacco: Never   Tobacco comments:    4 cigarettes daily   Substance and  Sexual Activity   Alcohol use: Not Currently    Alcohol/week: 0.0 standard drinks   Drug use: Yes    Types: Marijuana   Sexual activity: Yes    Birth control/protection: None  Other Topics Concern   Not on file  Social History Narrative   Not on file   Social Determinants of Health   Financial Resource Strain: Not on file  Food Insecurity: Not on file  Transportation Needs: Not on file  Physical Activity: Not on file  Stress: Not on file  Social Connections: Not on file  Intimate Partner Violence: Not on file    Past Surgical History:  Procedure Laterality Date   BACK SURGERY     two-lumbar disc x2   CLAVICLE SURGERY     COLONOSCOPY WITH PROPOFOL N/A 07/25/2014   NWG:NFAOZH rectal polyp otherwise normal . hyperplastic polyp. TCS in 07/2024   ESOPHAGEAL DILATION N/A 07/25/2014   Procedure: ESOPHAGEAL DILATION;  Surgeon: Daneil Dolin, MD;  Location: [facility];  Service: Endoscopy;  Laterality: N/A;  Malony dilators # 56,   ESOPHAGOGASTRODUODENOSCOPY (EGD) WITH PROPOFOL N/A 07/25/2014   YQM:VHQI erosive reflux/s/p dilator/HH. +hpylor    NECK SURGERY        Current Outpatient Medications:  atorvastatin (LIPITOR) 20 MG tablet, Take 1 tablet by mouth daily., Disp: , Rfl:    benztropine (COGENTIN) 1 MG tablet, Take 0.5 mg by mouth daily., Disp: , Rfl:    QUEtiapine (SEROQUEL) 100 MG tablet, Take 50 mg by mouth at bedtime., Disp: , Rfl:    traZODone (DESYREL) 50 MG tablet, Take 50 mg by mouth at bedtime as needed for sleep., Disp: , Rfl:     Physical Exam: Blood pressure 92/74, pulse (!) 109, height 5\' 11"  (1.803 m), weight 83.4 kg, SpO2 99 %.   Affect appropriate Healthy:  appears stated age 73: normal Neck supple with no adenopathy JVP normal no bruits no thyromegaly Lungs clear with no wheezing and good diaphragmatic motion Heart:  S1/S2 no murmur, no rub, gallop or click PMI normal Abdomen: benighn, BS positve, no tenderness, no AAA no bruit.  No HSM or HJR Distal  pulses intact with no bruits No edema Neuro non-focal Skin warm and dry No muscular weakness   Labs:   Lab Results  Component Value Date   WBC 4.0 07/15/2020   HGB 14.4 07/15/2020   HCT 42.8 07/15/2020   MCV 96.0 07/15/2020   PLT 250 07/15/2020   No results for input(s): NA, K, CL, CO2, BUN, CREATININE, CALCIUM, PROT, BILITOT, ALKPHOS, ALT, AST, GLUCOSE in the last 168 hours.  Invalid input(s): LABALBU No results found for: CKTOTAL, CKMB, CKMBINDEX, TROPONINI No results found for: CHOL No results found for: HDL No results found for: LDLCALC No results found for: TRIG No results found for: CHOLHDL No results found for: LDLDIRECT    Radiology: No results found.  EKG: SR rate 78 normal borderline right axis 08/26/2020 ST rate 94 normal    ASSESSMENT AND PLAN:   Dyspnea:  w/u negative sats fine CXR NAD will order echo for RV/LV function  HLD:  continue statin  Bipolar: mood stable continue Cogentin and seroquel CAD:  risk with atypical chest pain f/u Lexiscan myovue  Orthostasis:  ? Side effect from Cogentin Needs to f/u with Neurology as tremor in right arm worse And low BP / orthostatic symptoms likely from Cogentin   TTE Dyspnea Lexiscan Myovue chest pain    F/u PRN if testing normal   Signed: Jenkins Rouge 08/26/2020, 9:28 AM

## 2020-08-26 ENCOUNTER — Other Ambulatory Visit: Payer: Self-pay

## 2020-08-26 ENCOUNTER — Encounter: Payer: Self-pay | Admitting: Cardiovascular Disease

## 2020-08-26 ENCOUNTER — Ambulatory Visit (INDEPENDENT_AMBULATORY_CARE_PROVIDER_SITE_OTHER): Payer: Medicare HMO | Admitting: Cardiovascular Disease

## 2020-08-26 VITALS — BP 92/74 | HR 109 | Ht 71.0 in | Wt 183.8 lb

## 2020-08-26 DIAGNOSIS — F319 Bipolar disorder, unspecified: Secondary | ICD-10-CM

## 2020-08-26 DIAGNOSIS — R079 Chest pain, unspecified: Secondary | ICD-10-CM

## 2020-08-26 DIAGNOSIS — R0600 Dyspnea, unspecified: Secondary | ICD-10-CM

## 2020-08-26 DIAGNOSIS — R251 Tremor, unspecified: Secondary | ICD-10-CM | POA: Diagnosis not present

## 2020-08-26 NOTE — Addendum Note (Signed)
Addended by: Levonne Hubert on: 08/26/2020 02:22 PM   Modules accepted: Orders

## 2020-08-26 NOTE — Patient Instructions (Addendum)
Medication Instructions:  Your physician recommends that you continue on your current medications as directed. Please refer to the Current Medication list given to you today.  *If you need a refill on your cardiac medications before your next appointment, please call your pharmacy*   Lab Work: If you have labs (blood work) drawn today and your tests are completely normal, you will receive your results only by: [place] (if you have MyChart) OR A paper copy in the mail If you have any lab test that is abnormal or we need to change your treatment, we will call you to review the results.  Testing/Procedures: Your physician has requested that you have an echocardiogram. Echocardiography is a painless test that uses sound waves to create images of your heart. It provides your doctor with information about the size and shape of your heart and how well your heart's chambers and valves are working. This procedure takes approximately one hour. There are no restrictions for this procedure.  Your physician has requested that you have a lexiscan myoview. For further information please visit [email or web address]. Please follow instruction sheet, as given.  Follow-Up: At [facility], you and your health needs are our priority.  As part of our continuing mission to provide you with exceptional heart care, we have created designated Provider Care Teams.  These Care Teams include your primary Cardiologist (physician) and Advanced Practice Providers (APPs -  Physician Assistants and Nurse Practitioners) who all work together to provide you with the care you need, when you need it.  We recommend signing up for the patient portal called "MyChart".  Sign up information is provided on this After Visit Summary.  MyChart is used to connect with patients for Virtual Visits (Telemedicine).  Patients are able to view lab/test results, encounter notes, upcoming appointments, etc.  Non-urgent messages can be sent to  your provider as well.   To learn more about what you can do with MyChart, go to [email or web address].    Your next appointment:   As needed  The format for your next appointment:   In Person  Provider:   You may see Dr. [name] or one of the following Advanced Practice Providers on your designated Care Team:   Kathyrn Drown, NP

## 2020-08-27 ENCOUNTER — Other Ambulatory Visit: Payer: Self-pay | Admitting: Neurosurgery

## 2020-08-27 DIAGNOSIS — D352 Benign neoplasm of pituitary gland: Secondary | ICD-10-CM

## 2020-09-09 ENCOUNTER — Ambulatory Visit
Admission: RE | Admit: 2020-09-09 | Discharge: 2020-09-09 | Disposition: A | Discharge status: Home / Self Care | Payer: Medicare PPO | Source: Ambulatory Visit | Attending: Neurosurgery | Admitting: Neurosurgery

## 2020-09-09 DIAGNOSIS — E236 Other disorders of pituitary gland: Secondary | ICD-10-CM | POA: Diagnosis not present

## 2020-09-09 DIAGNOSIS — I619 Nontraumatic intracerebral hemorrhage, unspecified: Secondary | ICD-10-CM | POA: Diagnosis not present

## 2020-09-09 DIAGNOSIS — D352 Benign neoplasm of pituitary gland: Secondary | ICD-10-CM

## 2020-09-09 DIAGNOSIS — R42 Dizziness and giddiness: Secondary | ICD-10-CM | POA: Diagnosis not present

## 2020-09-09 MED ORDER — GADOBENATE DIMEGLUMINE 529 MG/ML IV SOLN
10.0000 mL | Freq: Once | INTRAVENOUS | Status: AC | PRN
  Administered 2020-09-09: 10 mL via INTRAVENOUS

## 2020-09-11 ENCOUNTER — Ambulatory Visit (HOSPITAL_COMMUNITY): Admission: RE | Admit: 2020-09-11 | Payer: Medicare PPO | Source: Ambulatory Visit

## 2020-09-11 ENCOUNTER — Other Ambulatory Visit: Payer: Self-pay

## 2020-09-11 ENCOUNTER — Encounter (HOSPITAL_COMMUNITY): Payer: Self-pay

## 2020-09-11 ENCOUNTER — Ambulatory Visit (HOSPITAL_COMMUNITY)
Admission: RE | Admit: 2020-09-11 | Discharge: 2020-09-11 | Disposition: A | Discharge status: Home / Self Care | Payer: Medicare PPO | Source: Ambulatory Visit | Attending: Cardiovascular Disease | Admitting: Cardiovascular Disease

## 2020-09-11 ENCOUNTER — Encounter (HOSPITAL_COMMUNITY)
Admission: RE | Admit: 2020-09-11 | Discharge: 2020-09-11 | Disposition: A | Discharge status: Home / Self Care | Payer: Medicare PPO | Source: Ambulatory Visit | Attending: Cardiovascular Disease | Admitting: Cardiovascular Disease

## 2020-09-11 DIAGNOSIS — R079 Chest pain, unspecified: Secondary | ICD-10-CM | POA: Diagnosis not present

## 2020-09-11 LAB — NM MYOCAR MULTI W/SPECT W/WALL MOTION / EF
LV dias vol: 96 mL (ref 62–150)
LV sys vol: 45 mL
Peak HR: 114 {beats}/min
RATE: 0.34
Rest HR: 73 {beats}/min
SDS: 2
SRS: 0
SSS: 2
TID: 0.98

## 2020-09-11 MED ORDER — REGADENOSON 0.4 MG/5ML IV SOLN
INTRAVENOUS | Status: AC
  Administered 2020-09-11: 0.4 mg via INTRAVENOUS
  Filled 2020-09-11: qty 5

## 2020-09-11 MED ORDER — TECHNETIUM TC 99M TETROFOSMIN IV KIT
10.0000 | PACK | Freq: Once | INTRAVENOUS | Status: AC | PRN
  Administered 2020-09-11: 11 via INTRAVENOUS

## 2020-09-11 MED ORDER — TECHNETIUM TC 99M TETROFOSMIN IV KIT
30.0000 | PACK | Freq: Once | INTRAVENOUS | Status: AC | PRN
  Administered 2020-09-11: 31 via INTRAVENOUS

## 2020-09-11 MED ORDER — SODIUM CHLORIDE FLUSH 0.9 % IV SOLN
INTRAVENOUS | Status: AC
  Administered 2020-09-11: 10 mL via INTRAVENOUS
  Filled 2020-09-11: qty 10

## 2020-09-29 DIAGNOSIS — Z681 Body mass index (BMI) 19 or less, adult: Secondary | ICD-10-CM | POA: Diagnosis not present

## 2020-09-29 DIAGNOSIS — J0100 Acute maxillary sinusitis, unspecified: Secondary | ICD-10-CM | POA: Diagnosis not present

## 2020-10-07 ENCOUNTER — Telehealth: Payer: Self-pay

## 2020-10-07 ENCOUNTER — Ambulatory Visit (HOSPITAL_COMMUNITY)
Admission: RE | Admit: 2020-10-07 | Discharge: 2020-10-07 | Disposition: A | Discharge status: Home / Self Care | Payer: Medicare PPO | Source: Ambulatory Visit | Attending: Cardiovascular Disease | Admitting: Cardiovascular Disease

## 2020-10-07 ENCOUNTER — Other Ambulatory Visit: Payer: Self-pay

## 2020-10-07 DIAGNOSIS — R0609 Other forms of dyspnea: Secondary | ICD-10-CM

## 2020-10-07 DIAGNOSIS — R0600 Dyspnea, unspecified: Secondary | ICD-10-CM | POA: Insufficient documentation

## 2020-10-07 LAB — ECHOCARDIOGRAM COMPLETE
AR max vel: 2.95 cm2
AV Area VTI: 3.37 cm2
AV Area mean vel: 2.78 cm2
AV Mean grad: 3 mmHg
AV Peak grad: 4.8 mmHg
Ao pk vel: 1.09 m/s
Area-P 1/2: 5.84 cm2
MV VTI: 4.04 cm2
S' Lateral: 2.94 cm

## 2020-10-07 NOTE — Telephone Encounter (Signed)
Patient notified and verbalized understanding. Pt had no questions or concerns at this time 

## 2020-10-07 NOTE — Progress Notes (Signed)
*  PRELIMINARY RESULTS* Echocardiogram 2D Echocardiogram has been performed.  Bobby York 10/07/2020, 3:00 PM

## 2020-10-07 NOTE — Telephone Encounter (Signed)
-----   Message from Josue Hector, MD sent at 10/07/2020  3:27 PM EDT ----- EF normal no bad valves overall good

## 2020-10-23 DIAGNOSIS — E663 Overweight: Secondary | ICD-10-CM | POA: Diagnosis not present

## 2020-10-23 DIAGNOSIS — Z6826 Body mass index (BMI) 26.0-26.9, adult: Secondary | ICD-10-CM | POA: Diagnosis not present

## 2020-10-23 DIAGNOSIS — K219 Gastro-esophageal reflux disease without esophagitis: Secondary | ICD-10-CM | POA: Diagnosis not present

## 2021-01-19 DIAGNOSIS — I951 Orthostatic hypotension: Secondary | ICD-10-CM | POA: Diagnosis not present

## 2021-01-19 DIAGNOSIS — E663 Overweight: Secondary | ICD-10-CM | POA: Diagnosis not present

## 2021-01-19 DIAGNOSIS — T50905A Adverse effect of unspecified drugs, medicaments and biological substances, initial encounter: Secondary | ICD-10-CM | POA: Diagnosis not present

## 2021-01-19 DIAGNOSIS — Z6826 Body mass index (BMI) 26.0-26.9, adult: Secondary | ICD-10-CM | POA: Diagnosis not present

## 2021-01-20 ENCOUNTER — Emergency Department (HOSPITAL_COMMUNITY): Payer: Medicare PPO

## 2021-01-20 ENCOUNTER — Emergency Department (HOSPITAL_COMMUNITY)
Admission: EM | Admit: 2021-01-20 | Discharge: 2021-01-20 | Disposition: A | Discharge status: Home / Self Care | Payer: Medicare PPO | Attending: Emergency Medicine | Admitting: Emergency Medicine

## 2021-01-20 ENCOUNTER — Other Ambulatory Visit: Payer: Self-pay

## 2021-01-20 ENCOUNTER — Encounter (HOSPITAL_COMMUNITY): Payer: Self-pay | Admitting: Emergency Medicine

## 2021-01-20 DIAGNOSIS — F17210 Nicotine dependence, cigarettes, uncomplicated: Secondary | ICD-10-CM | POA: Insufficient documentation

## 2021-01-20 DIAGNOSIS — R42 Dizziness and giddiness: Secondary | ICD-10-CM

## 2021-01-20 DIAGNOSIS — Z5181 Encounter for therapeutic drug level monitoring: Secondary | ICD-10-CM | POA: Insufficient documentation

## 2021-01-20 DIAGNOSIS — I951 Orthostatic hypotension: Secondary | ICD-10-CM

## 2021-01-20 DIAGNOSIS — Z79899 Other long term (current) drug therapy: Secondary | ICD-10-CM | POA: Insufficient documentation

## 2021-01-20 LAB — CBC WITH DIFFERENTIAL/PLATELET
Abs Immature Granulocytes: 0.02 10*3/uL (ref 0.00–0.07)
Basophils Absolute: 0 10*3/uL (ref 0.0–0.1)
Basophils Relative: 0 %
Eosinophils Absolute: 0.3 10*3/uL (ref 0.0–0.5)
Eosinophils Relative: 6 %
HCT: 40.3 % (ref 39.0–52.0)
Hemoglobin: 13.7 g/dL (ref 13.0–17.0)
Immature Granulocytes: 0 %
Lymphocytes Relative: 39 %
Lymphs Abs: 2 10*3/uL (ref 0.7–4.0)
MCH: 31.1 pg (ref 26.0–34.0)
MCHC: 34 g/dL (ref 30.0–36.0)
MCV: 91.6 fL (ref 80.0–100.0)
Monocytes Absolute: 0.3 10*3/uL (ref 0.1–1.0)
Monocytes Relative: 6 %
Neutro Abs: 2.5 10*3/uL (ref 1.7–7.7)
Neutrophils Relative %: 49 %
Platelets: 240 10*3/uL (ref 150–400)
RBC: 4.4 MIL/uL (ref 4.22–5.81)
RDW: 11.9 % (ref 11.5–15.5)
WBC: 5.1 10*3/uL (ref 4.0–10.5)
nRBC: 0 % (ref 0.0–0.2)

## 2021-01-20 LAB — URINALYSIS, ROUTINE W REFLEX MICROSCOPIC
Bilirubin Urine: NEGATIVE
Glucose, UA: NEGATIVE mg/dL
Hgb urine dipstick: NEGATIVE
Ketones, ur: NEGATIVE mg/dL
Leukocytes,Ua: NEGATIVE
Nitrite: NEGATIVE
Protein, ur: NEGATIVE mg/dL
Specific Gravity, Urine: 1.015 (ref 1.005–1.030)
pH: 8 (ref 5.0–8.0)

## 2021-01-20 LAB — AMMONIA: Ammonia: 42 umol/L — ABNORMAL HIGH (ref 9–35)

## 2021-01-20 LAB — COMPREHENSIVE METABOLIC PANEL
ALT: 22 U/L (ref 0–44)
AST: 29 U/L (ref 15–41)
Albumin: 3.8 g/dL (ref 3.5–5.0)
Alkaline Phosphatase: 52 U/L (ref 38–126)
Anion gap: 6 (ref 5–15)
BUN: 15 mg/dL (ref 8–23)
CO2: 24 mmol/L (ref 22–32)
Calcium: 9.1 mg/dL (ref 8.9–10.3)
Chloride: 106 mmol/L (ref 98–111)
Creatinine, Ser: 1.13 mg/dL (ref 0.61–1.24)
GFR, Estimated: >60 mL/min (ref >60)
Glucose, Bld: 128 mg/dL — ABNORMAL HIGH (ref 70–99)
Potassium: 4.1 mmol/L (ref 3.5–5.1)
Sodium: 136 mmol/L (ref 135–145)
Total Bilirubin: 0.8 mg/dL (ref 0.3–1.2)
Total Protein: 7.1 g/dL (ref 6.5–8.1)

## 2021-01-20 LAB — PROTIME-INR
INR: 1.1 (ref 0.8–1.2)
Prothrombin Time: 14.1 seconds (ref 11.4–15.2)

## 2021-01-20 MED ORDER — SODIUM CHLORIDE 0.9 % IV BOLUS
1000.0000 mL | Freq: Once | INTRAVENOUS | Status: AC
  Administered 2021-01-20: 1000 mL via INTRAVENOUS

## 2021-01-20 NOTE — ED Notes (Signed)
Pt given ice chips at this time 

## 2021-01-20 NOTE — ED Provider Notes (Addendum)
[facility] EMERGENCY DEPARTMENT Provider Note   CSN: [id number] Arrival date & time: 01/20/21  0502     History Chief Complaint  Patient presents with   Hypotension    Bobby York is a 65 y.o. male.  Patient presents to the emergency department with concerns over dizziness.  He has been experiencing ongoing episodes of dizziness, went to his primary care doctor yesterday.  At the visit his blood pressure was low, he had his Seroquel stopped.  Patient has not been able to sleep, thinks it is because of stopping the medication.  He started to feel dizzy so he checked his blood pressure and it was 50/30 on his cuff at home.  Patient not experiencing any chest pain.      Past Medical History:  Diagnosis Date   Bipolar 1 disorder ([place])    diagnosed 2004   Hepatitis    Hep C    Patient Active Problem List   Diagnosis Date Noted   Pneumothorax, traumatic 04/14/2016   H. pylori infection 08/22/2014   Reflux esophagitis    Dysphagia, pharyngoesophageal phase    Mucosal abnormality of stomach    History of colonic polyps    Chronic hepatitis C ([place]) 07/09/2014   Abnormal weight loss 07/09/2014   History of ETOH abuse 07/09/2014   Esophageal dysphagia 07/09/2014   Odynophagia 07/09/2014   Abnormal ECG 05/28/2014    Past Surgical History:  Procedure Laterality Date   BACK SURGERY     two-lumbar disc x2   CLAVICLE SURGERY     COLONOSCOPY WITH PROPOFOL N/A 07/25/2014   KJZ:PHXTAV rectal polyp otherwise normal . hyperplastic polyp. TCS in 07/2024   ESOPHAGEAL DILATION N/A 07/25/2014   Procedure: ESOPHAGEAL DILATION;  Surgeon: Daneil Dolin, MD;  Location: [facility];  Service: Endoscopy;  Laterality: N/A;  Malony dilators # 56,   ESOPHAGOGASTRODUODENOSCOPY (EGD) WITH PROPOFOL N/A 07/25/2014   WPV:XYIA erosive reflux/s/p dilator/HH. +hpylor    NECK SURGERY         Family History  Problem Relation Age of Onset   Heart disease Mother    Diabetes Mother    Prostate cancer  Father    Liver disease Neg Hx    Colon cancer Neg Hx     Social History   Tobacco Use   Smoking status: Every Day    Packs/day: 0.40    Years: 40.00    Pack years: 16.00    Types: Cigarettes    Start date: 05/28/1970   Smokeless tobacco: Never   Tobacco comments:    4 cigarettes daily   Substance Use Topics   Alcohol use: Not Currently    Alcohol/week: 0.0 standard drinks   Drug use: Yes    Types: Marijuana    Home Medications Prior to Admission medications   Medication Sig Start Date End Date Taking? Authorizing Provider  atorvastatin (LIPITOR) 20 MG tablet Take 1 tablet by mouth daily. 04/29/20   [provider]  benztropine (COGENTIN) 1 MG tablet Take 0.5 mg by mouth daily. 03/20/20   [provider]  QUEtiapine (SEROQUEL) 100 MG tablet Take 50 mg by mouth at bedtime. 04/30/20   [provider]  traZODone (DESYREL) 50 MG tablet Take 50 mg by mouth at bedtime as needed for sleep. 04/30/20   [provider]    Allergies    Patient has no known allergies.  Review of Systems   Review of Systems  Neurological:  Positive for dizziness.  All other systems  reviewed and are negative.  Physical Exam Updated Vital Signs BP (!) 160/106   Pulse 64   Temp 97.7 F (36.5 C) (Oral)   Resp 18   Ht 5\' 11"  (1.803 m)   Wt 86.2 kg   SpO2 98%   BMI 26.50 kg/m   Physical Exam Vitals and nursing note reviewed.  Constitutional:      General: He is not in acute distress.    Appearance: Normal appearance. He is well-developed.  HENT:     Head: Normocephalic and atraumatic.     Right Ear: Hearing normal.     Left Ear: Hearing normal.     Nose: Nose normal.  Eyes:     Conjunctiva/sclera: Conjunctivae normal.     Pupils: Pupils are equal, round, and reactive to light.  Cardiovascular:     Rate and Rhythm: Regular rhythm.     Heart sounds: S1 normal and S2 normal. No murmur heard.   No friction rub. No gallop.  Pulmonary:     Effort: Pulmonary  effort is normal. No respiratory distress.     Breath sounds: Normal breath sounds.  Chest:     Chest wall: No tenderness.  Abdominal:     General: Bowel sounds are normal.     Palpations: Abdomen is soft.     Tenderness: There is no abdominal tenderness. There is no guarding or rebound. Negative signs include Murphy's sign and McBurney's sign.     Hernia: No hernia is present.  Musculoskeletal:        General: Normal range of motion.     Cervical back: Normal range of motion and neck supple.  Skin:    General: Skin is warm and dry.     Findings: No rash.  Neurological:     Mental Status: He is alert and oriented to person, place, and time.     GCS: GCS eye subscore is 4. GCS verbal subscore is 5. GCS motor subscore is 6.     Cranial Nerves: No cranial nerve deficit.     Sensory: No sensory deficit.     Motor: Tremor present.     Coordination: Coordination normal.  Psychiatric:        Speech: Speech normal.        Behavior: Behavior normal.        Thought Content: Thought content normal.    ED Results / Procedures / Treatments   Labs (all labs ordered are listed, but only abnormal results are displayed) Labs Reviewed  COMPREHENSIVE METABOLIC PANEL - Abnormal; Notable for the following components:      Result Value   Glucose, Bld 128 (*)    All other components within normal limits  AMMONIA - Abnormal; Notable for the following components:   Ammonia 42 (*)    All other components within normal limits  CBC WITH DIFFERENTIAL/PLATELET  URINALYSIS, ROUTINE W REFLEX MICROSCOPIC  PROTIME-INR    EKG EKG Interpretation  Date/Time:  Tuesday January 20 2021 05:11:38 EST Ventricular Rate:  71 PR Interval:  152 QRS Duration: 96 QT Interval:  421 QTC Calculation: 458 R Axis:   85 Text Interpretation: Sinus rhythm Borderline right axis deviation Minimal ST depression, lateral leads Baseline wander in lead(s) V5 Confirmed by Orpah Greek [phone]) on 01/20/2021 6:47:43  AM  Radiology No results found.  Procedures Procedures   Medications Ordered in ED Medications  sodium chloride 0.9 % bolus 1,000 mL (has no administration in time range)    ED Course  I  have reviewed the triage vital signs and the nursing notes.  Pertinent labs & imaging results that were available during my care of the patient were reviewed by me and considered in my medical decision making (York chart for details).    MDM Rules/Calculators/A&P                           Patient presents to the emergency department for evaluation of dizziness and low blood pressure.  Patient has been having episodes of dizziness for some time.  Is being worked up as an outpatient by his primary doctor.  Patient had his Seroquel stopped yesterday.  This likely led to him having difficulty sleeping tonight.  He noted recurrence of the dizziness and checked his blood pressure.  He reports that his blood pressure was 53/38.  Lab work is unremarkable.  Etiology of the dizziness is unclear, continue outpatient work-up.  He does not have any focal neurologic findings at this time.  No concern for central vertigo or stroke.  CT is currently broken.  I do not feel that the patient requires a CT scan at this time, will likely be very low yield.  Symptoms are secondary to orthostatic hypotension.  Patient will be given IV fluids and recheck.  Signout oncoming ER physician to recheck his vitals after fluid bolus.  Final Clinical Impression(s) / ED Diagnoses Final diagnoses:  Dizziness  Orthostatic hypotension    Rx / DC Orders ED Discharge Orders     None        Poseidon Pam, Gwenyth Allegra, MD 01/20/21 [id number]    Orpah Greek, MD 01/20/21 [id number]

## 2021-01-20 NOTE — Discharge Instructions (Signed)
Stop taking the Quetiapine as told by your doctor.   Call and get follow up this week

## 2021-01-20 NOTE — ED Triage Notes (Signed)
Pt brought in by family for hypotension. Pt seen yesterday by PCP for hypotension and dizziness. Pt advised to discontinue one of his medications due to the hypotension.

## 2021-01-20 NOTE — ED Notes (Signed)
During orthostatic vitals pt. Felt dizzy and light headed when standing also states shortness of breath

## 2021-01-27 DIAGNOSIS — I951 Orthostatic hypotension: Secondary | ICD-10-CM | POA: Diagnosis not present

## 2021-01-27 DIAGNOSIS — T50905A Adverse effect of unspecified drugs, medicaments and biological substances, initial encounter: Secondary | ICD-10-CM | POA: Diagnosis not present

## 2021-01-27 DIAGNOSIS — E663 Overweight: Secondary | ICD-10-CM | POA: Diagnosis not present

## 2021-01-27 DIAGNOSIS — Z6826 Body mass index (BMI) 26.0-26.9, adult: Secondary | ICD-10-CM | POA: Diagnosis not present

## 2021-01-27 DIAGNOSIS — E782 Mixed hyperlipidemia: Secondary | ICD-10-CM | POA: Diagnosis not present

## 2021-01-27 DIAGNOSIS — F329 Major depressive disorder, single episode, unspecified: Secondary | ICD-10-CM | POA: Diagnosis not present

## 2021-01-27 DIAGNOSIS — B182 Chronic viral hepatitis C: Secondary | ICD-10-CM | POA: Diagnosis not present

## 2021-01-27 DIAGNOSIS — K219 Gastro-esophageal reflux disease without esophagitis: Secondary | ICD-10-CM | POA: Diagnosis not present

## 2021-02-09 DIAGNOSIS — Z6826 Body mass index (BMI) 26.0-26.9, adult: Secondary | ICD-10-CM | POA: Diagnosis not present

## 2021-02-09 DIAGNOSIS — I951 Orthostatic hypotension: Secondary | ICD-10-CM | POA: Diagnosis not present

## 2021-02-09 DIAGNOSIS — E663 Overweight: Secondary | ICD-10-CM | POA: Diagnosis not present

## 2021-02-24 DIAGNOSIS — G90A Postural orthostatic tachycardia syndrome (POTS): Secondary | ICD-10-CM | POA: Diagnosis not present

## 2021-02-24 DIAGNOSIS — E663 Overweight: Secondary | ICD-10-CM | POA: Diagnosis not present

## 2021-02-24 DIAGNOSIS — I951 Orthostatic hypotension: Secondary | ICD-10-CM | POA: Diagnosis not present

## 2021-02-24 DIAGNOSIS — Z6826 Body mass index (BMI) 26.0-26.9, adult: Secondary | ICD-10-CM | POA: Diagnosis not present

## 2021-03-24 NOTE — Progress Notes (Signed)
CARDIOLOGY CONSULT NOTE       Patient ID: Bobby York MRN: [id number] DOB/AGE: 67-Jul-1957 67 y.o.  Admit date: (Not on file) Referring Physician: [name] Primary Physician: [name], MD Primary Cardiologist: [name]  Reason for Consultation: Dyspnea/POTS   Active Problems:   * No active hospital problems. *   HPI:  66 y.o. referred by Dr [name] for  dyspnea with ER visit 07/15/20 History of Bipolar disease and hepatitis, ETOH/Cocaine abuse/use. No history of vascular disease or CAD. On statin for HLD LDL 135 last year. Seen in ED 07/15/20 with dyspnea and nausea Sats fine CXR NAD COVID/Influenza negative D dimer normal troponin negative Hct 42.8  and no acute ECG changes History of gastritis with EGD 2016 and esophageal dilation   He indicated some tightness in the exam room and ECG was normal On Cogentin for tremors worse in right arm/hand Dose lowered recently   He was in the [employer]. Sees [facility] Son deceased and daughter works in school system in [place] Has 3 older grandchildren  BP seems to run low with some postural symptoms when taking bipolar meds  TTE 10/07/20 normal EF 60-65%  Myovue 09/11/20 normal no ischemia EF 54%   Seen in ED 01/20/21 with low BP and dizziness Given fluids and D/c Primary tried to d/c Seroquel but patient had insomnia BUN/CR normal not azotemic  And Hct 42 Has had MRI 09/09/20 with unchanged size of pituitary macroadenoma Sees [facility] neurosurgery for CNS mass, RUE tremor Has had multiple cervical and lumbar spine surgeries   He does not have a cardiac problem he has orthostasis and worsening PTSD/panic  This can be taken care of by his primary, neurology and behavioral specialist at [place] All other systems reviewed and negative except as noted above  Past Medical History:  Diagnosis Date   Bipolar 1 disorder ([place])    diagnosed 2004   Hepatitis    Hep C    Family History  Problem Relation Age of Onset   Heart  disease Mother    Diabetes Mother    Prostate cancer Father    Liver disease Neg Hx    Colon cancer Neg Hx     Social History   Socioeconomic History   Marital status: Married    Spouse name: Not on file   Number of children: Not on file   Years of education: Not on file   Highest education level: Not on file  Occupational History   Not on file  Tobacco Use   Smoking status: Every Day    Packs/day: 0.40    Years: 40.00    Pack years: 16.00    Types: Cigarettes    Start date: 05/28/1970   Smokeless tobacco: Never   Tobacco comments:    4 cigarettes daily   Substance and Sexual Activity   Alcohol use: Not Currently    Alcohol/week: 0.0 standard drinks   Drug use: Yes    Types: Marijuana   Sexual activity: Yes    Birth control/protection: None  Other Topics Concern   Not on file  Social History Narrative   Not on file   Social Determinants of Health   Financial Resource Strain: Not on file  Food Insecurity: Not on file  Transportation Needs: Not on file  Physical Activity: Not on file  Stress: Not on file  Social Connections: Not on file  Intimate Partner Violence: Not on file    Past Surgical History:  Procedure Laterality Date   BACK SURGERY     two-lumbar disc x2   CLAVICLE SURGERY     COLONOSCOPY WITH PROPOFOL N/A 07/25/2014   MLY:YTKPTW rectal polyp otherwise normal . hyperplastic polyp. TCS in 07/2024   ESOPHAGEAL DILATION N/A 07/25/2014   Procedure: ESOPHAGEAL DILATION;  Surgeon: Daneil Dolin, MD;  Location: [facility];  Service: Endoscopy;  Laterality: N/A;  Malony dilators # 56,   ESOPHAGOGASTRODUODENOSCOPY (EGD) WITH PROPOFOL N/A 07/25/2014   SFK:CLEX erosive reflux/s/p dilator/HH. +hpylor    NECK SURGERY        Current Outpatient Medications:    atorvastatin (LIPITOR) 20 MG tablet, Take 1 tablet by mouth daily., Disp: , Rfl:    benztropine (COGENTIN) 1 MG tablet, Take 0.5 mg by mouth daily., Disp: , Rfl:    QUEtiapine (SEROQUEL) 100 MG tablet, Take 50 mg  by mouth at bedtime., Disp: , Rfl:    traZODone (DESYREL) 50 MG tablet, Take 50 mg by mouth at bedtime as needed for sleep., Disp: , Rfl:     Physical Exam: There were no vitals taken for this visit.   Affect appropriate Healthy:  appears stated age 66: normal Neck supple with no adenopathy JVP normal no bruits no thyromegaly Lungs clear with no wheezing and good diaphragmatic motion Heart:  S1/S2 no murmur, no rub, gallop or click PMI normal Abdomen: benighn, BS positve, no tenderness, no AAA no bruit.  No HSM or HJR Distal pulses intact with no bruits No edema Neuro non-focal Skin warm and dry No muscular weakness   Labs:   Lab Results  Component Value Date   WBC 5.1 01/20/2021   HGB 13.7 01/20/2021   HCT 40.3 01/20/2021   MCV 91.6 01/20/2021   PLT 240 01/20/2021   No results for input(s): NA, K, CL, CO2, BUN, CREATININE, CALCIUM, PROT, BILITOT, ALKPHOS, ALT, AST, GLUCOSE in the last 168 hours.  Invalid input(s): LABALBU No results found for: CKTOTAL, CKMB, CKMBINDEX, TROPONINI No results found for: CHOL No results found for: HDL No results found for: LDLCALC No results found for: TRIG No results found for: CHOLHDL No results found for: LDLDIRECT    Radiology: No results found.  EKG: SR rate 78 normal borderline right axis 03/24/2021 ST rate 94 normal    ASSESSMENT AND PLAN:   Dyspnea:  w/u negative sats fine CXR NAD TTE 10/07/20 EF 60-65% normal RV and valves  HLD:  continue statin  Bipolar: mood stable continue Seroquel d/c meds make orthostasis worse  CAD:  risk with atypical chest pain Normal myovue 09/11/20  Orthostasis:  ? Side effect from Cogentin Needs to f/u with Neurology as tremor in right arm worse And low BP / orthostatic symptoms likely from Cogentin and polypharmacy with Seroquel and Trazodone Increase midodrine to 10 mg tid If this does not help neuro/primary can consider [place]ine[place]  Meningioma/Macroadenoma:  f/u neurosurgery this his meds  above and multiple previous spine surgeries likely etiology of his orthostasis and dizziness    Midodrine 10 mg tid F/U primary neurology and neurosurgery   No need for routine cardiology f/u   Signed: Jenkins Rouge 03/24/2021, 1:37 PM

## 2021-04-01 ENCOUNTER — Other Ambulatory Visit: Payer: Self-pay

## 2021-04-01 ENCOUNTER — Encounter: Payer: Self-pay | Admitting: Cardiovascular Disease

## 2021-04-01 ENCOUNTER — Ambulatory Visit (INDEPENDENT_AMBULATORY_CARE_PROVIDER_SITE_OTHER): Payer: Commercial Managed Care - PPO | Admitting: Cardiovascular Disease

## 2021-04-01 VITALS — BP 98/70 | HR 91 | Ht 71.0 in | Wt 191.0 lb

## 2021-04-01 DIAGNOSIS — F4310 Post-traumatic stress disorder, unspecified: Secondary | ICD-10-CM

## 2021-04-01 DIAGNOSIS — I951 Orthostatic hypotension: Secondary | ICD-10-CM | POA: Diagnosis not present

## 2021-04-01 DIAGNOSIS — R0600 Dyspnea, unspecified: Secondary | ICD-10-CM

## 2021-04-01 DIAGNOSIS — R079 Chest pain, unspecified: Secondary | ICD-10-CM

## 2021-04-01 MED ORDER — MIDODRINE HCL 10 MG PO TABS: 10.0000 mg | ORAL_TABLET | Freq: Three times a day (TID) | ORAL | 3 refills | Status: DC

## 2021-04-01 NOTE — Patient Instructions (Signed)
Medication Instructions:   Increase Midodrine to 10 mg 3 times daily   *If you need a refill on your cardiac medications before your next appointment, please call your pharmacy*   Lab Work: NONE   If you have labs (blood work) drawn today and your tests are completely normal, you will receive your results only by: [place] (if you have MyChart) OR A paper copy in the mail If you have any lab test that is abnormal or we need to change your treatment, we will call you to review the results.   Testing/Procedures: NONE    Follow-Up: At [facility], you and your health needs are our priority.  As part of our continuing mission to provide you with exceptional heart care, we have created designated Provider Care Teams.  These Care Teams include your primary Cardiologist (physician) and Advanced Practice Providers (APPs -  Physician Assistants and Nurse Practitioners) who all work together to provide you with the care you need, when you need it.  We recommend signing up for the patient portal called "MyChart".  Sign up information is provided on this After Visit Summary.  MyChart is used to connect with patients for Virtual Visits (Telemedicine).  Patients are able to view lab/test results, encounter notes, upcoming appointments, etc.  Non-urgent messages can be sent to your provider as well.   To learn more about what you can do with MyChart, go to [email or web address].    Your next appointment:    As Needed   The format for your next appointment:   In Person  Provider:   Jenkins Rouge, MD    Other Instructions Thank you for choosing [facility]!

## 2021-04-22 DIAGNOSIS — Z1331 Encounter for screening for depression: Secondary | ICD-10-CM | POA: Diagnosis not present

## 2021-04-22 DIAGNOSIS — F329 Major depressive disorder, single episode, unspecified: Secondary | ICD-10-CM | POA: Diagnosis not present

## 2021-04-22 DIAGNOSIS — E782 Mixed hyperlipidemia: Secondary | ICD-10-CM | POA: Diagnosis not present

## 2021-04-22 DIAGNOSIS — I951 Orthostatic hypotension: Secondary | ICD-10-CM | POA: Diagnosis not present

## 2021-04-22 DIAGNOSIS — G90A Postural orthostatic tachycardia syndrome (POTS): Secondary | ICD-10-CM | POA: Diagnosis not present

## 2021-04-22 DIAGNOSIS — E663 Overweight: Secondary | ICD-10-CM | POA: Diagnosis not present

## 2021-04-22 DIAGNOSIS — E7849 Other hyperlipidemia: Secondary | ICD-10-CM | POA: Diagnosis not present

## 2021-04-22 DIAGNOSIS — B182 Chronic viral hepatitis C: Secondary | ICD-10-CM | POA: Diagnosis not present

## 2021-04-22 DIAGNOSIS — Z6826 Body mass index (BMI) 26.0-26.9, adult: Secondary | ICD-10-CM | POA: Diagnosis not present

## 2021-04-22 DIAGNOSIS — Z0001 Encounter for general adult medical examination with abnormal findings: Secondary | ICD-10-CM | POA: Diagnosis not present

## 2021-05-14 DIAGNOSIS — E663 Overweight: Secondary | ICD-10-CM | POA: Diagnosis not present

## 2021-05-14 DIAGNOSIS — Z6826 Body mass index (BMI) 26.0-26.9, adult: Secondary | ICD-10-CM | POA: Diagnosis not present

## 2021-05-14 DIAGNOSIS — G90A Postural orthostatic tachycardia syndrome (POTS): Secondary | ICD-10-CM | POA: Diagnosis not present

## 2021-05-14 DIAGNOSIS — J449 Chronic obstructive pulmonary disease, unspecified: Secondary | ICD-10-CM | POA: Diagnosis not present

## 2021-08-27 ENCOUNTER — Encounter: Payer: Self-pay | Admitting: Internal Medicine

## 2021-08-27 ENCOUNTER — Ambulatory Visit (INDEPENDENT_AMBULATORY_CARE_PROVIDER_SITE_OTHER): Payer: No Typology Code available for payment source | Admitting: Internal Medicine

## 2021-08-27 ENCOUNTER — Ambulatory Visit (HOSPITAL_COMMUNITY)
Admission: RE | Admit: 2021-08-27 | Discharge: 2021-08-27 | Disposition: A | Discharge status: Home / Self Care | Payer: No Typology Code available for payment source | Source: Ambulatory Visit | Attending: Internal Medicine | Admitting: Internal Medicine

## 2021-08-27 DIAGNOSIS — R0609 Other forms of dyspnea: Secondary | ICD-10-CM | POA: Insufficient documentation

## 2021-08-27 MED ORDER — BREZTRI AEROSPHERE 160-9-4.8 MCG/ACT IN AERO: 2.0000 | INHALATION_SPRAY | Freq: Two times a day (BID) | RESPIRATORY_TRACT | 0 refills | Status: DC

## 2021-08-27 NOTE — Patient Instructions (Addendum)
Try breztri 2puffs each 9 am and keep walking to Mailbox daily at  3pm  If not improving after 2 weeks ok to stop breztri   PFTs next available - ok to use [facility] if up to driving there.  Please remember to go to the  x-ray department  @  [facility] [address] for your tests - we will call you with the results when they are available      No pulmonary follow up needed until / unless PFTs are available to review

## 2021-08-27 NOTE — Assessment & Plan Note (Addendum)
Onset p covid 19 vax  March 2021 - myoview 09/11/20  Nl EF/ no ischemic changes  - echo 10/07/20 ok  - quit smoking 03/2021  - 08/27/2021   Walked on RA  x  1.5  lap(s) =  approx 225  ft  @ slow/ shuffling pace, stopped due to knee pain, legs gave out before breathing  with lowest 02 sats 100%  - 08/27/2021  After extensive coaching inhaler device,  effectiveness =   75% from baseline hfa of 25% > try breztri 2 pffs about 6 h before daily walk to MB pending f/u with pfts when available    Strongly doubt he has much copd and As I explained to this patient in detail:  although there may be copd present, it may not be clinically relevant because he is so sedentary   Given one container sample of breztri to take daily x 2 weeks and walk to MB w/in 6 h and see if does better or not pending PFTs / ordered.    Will need screening LDSCT chest for which he is eligible for the next 14 years (until age 19) but defer this to pcp as part of an annual health eval.    Each maintenance medication was reviewed in detail including emphasizing most importantly the difference between maintenance and prns and under what circumstances the prns are to be triggered using an action plan format where appropriate.  Total time for H and P, chart review, counseling, reviewing hfa device(s) , directly observing portions of ambulatory 02 saturation study/ and generating customized AVS unique to this office visit / same day charting  > 45 min for new pt with refractory respiratory  symptoms of uncertain etiology

## 2021-08-27 NOTE — Progress Notes (Signed)
Bobby York, male    DOB: 05-22-1955    MRN: [id number]   Brief patient profile:  64   yobm quit smoking 2.2023  referred to pulmonary clinic in [place]  08/27/2021 by Dr Bobby York  for ? Copd with doe starting p 1st covid injection March 2021 on a background of ?bipolar disorder with ? Movement disorder from phenothiazine RX       History of Present Illness  08/27/2021  Pulmonary/ 1st office eval/ Bobby York / [facility] York Office  Chief Complaint  Patient presents with   Consult    Consult ref by Dr. Hilma York for COPD- SOB for 8 months. Patient states he cannot walk more than 7-8 steps without getting lightheaded and out of breath.   Dyspnea:  MB 100 ft slt incline slow pace and stops then "hurries back downhill home before his breathing gives out no better on breztri but hfa quite poor technicque Cough: hoarse but no cough  Sleep: fine with hop up 30 degrees with pillows under mattress SABA use: none  No obvious day to day or daytime pattern/variability or assoc excess/ purulent sputum or mucus plugs or hemoptysis or cp or chest tightness, subjective wheeze or overt sinus or hb symptoms.   Sleeping  without nocturnal  or early am exacerbation  of respiratory  c/o's or need for noct saba. Also denies any obvious fluctuation of symptoms with weather or environmental changes or other aggravating or alleviating factors except as outlined above   No unusual exposure hx or h/o childhood pna/ asthma or knowledge of premature birth.  Current Allergies, Complete Past Medical History, Past Surgical History, Family History, and Social History were reviewed in [employer] record.  ROS  The following are not active complaints unless bolded Hoarseness, sore throat, dysphagia, dental problems, itching, sneezing,  nasal congestion or discharge of excess mucus or purulent secretions, ear ache,   fever, chills, sweats, unintended wt loss or wt gain, classically pleuritic or exertional  cp,  orthopnea pnd or arm/hand swelling  or leg swelling, presyncope, palpitations, abdominal pain, anorexia, nausea, vomiting, diarrhea  or change in bowel habits or change in bladder habits, change in stools or change in urine, dysuria, hematuria,  rash, arthralgias, visual complaints, headache, numbness, weakness or ataxia or problems with walking or coordination,  change in mood or  memory.resting tremor           Past Medical History:  Diagnosis Date   Bipolar 1 disorder ([place])    diagnosed 2004   Hepatitis    Hep C    Outpatient Medications Prior to Visit  Medication Sig Dispense Refill   benztropine (COGENTIN) 1 MG tablet Take 0.5 mg by mouth daily.     Budeson-Glycopyrrol-Formoterol (BREZTRI AEROSPHERE IN) Inhale into the lungs.     Multiple Vitamin (MULTIVITAMIN ADULT PO) Take 1 tablet by mouth daily.     pantoprazole (PROTONIX) 40 MG tablet Take 40 mg by mouth daily.     risperiDONE (RISPERDAL) 0.5 MG tablet TAKE ONE-HALF TABLET BY MOUTH DAILY FOR MENTAL HEALTH     traZODone (DESYREL) 50 MG tablet Take 50 mg by mouth at bedtime as needed for sleep.     atorvastatin (LIPITOR) 20 MG tablet Take 1 tablet by mouth daily. (Patient not taking: Reported on 04/01/2021)     midodrine (PROAMATINE) 10 MG tablet Take 1 tablet (10 mg total) by mouth 3 (three) times daily. 270 tablet 3   No facility-administered medications prior to visit.  Objective:     BP 128/84 (BP Location: Right Arm, Patient Position: Sitting)   Pulse 90   Temp 98 F (36.7 C) (Temporal)   Ht '5\' 11"'$  (1.803 m)   Wt 182 lb 9.6 oz (82.8 kg)   SpO2 97% Comment: ra- checked in the lobby.  BMI 25.47 kg/m   SpO2: 97 % (ra- checked in the lobby.)  W/c bound elderly bm, raspy voice/ resting tremor    HEENT : Oropharynx  clear/ edentulous   Nasal turbinates nl    NECK :  without  apparent JVD/ palpable Nodes/TM    LUNGS: no acc muscle use,  Min barrel  contour chest wall with bilateral  slightly decreased bs  s audible wheeze and  without cough on insp or exp maneuvers and min  Hyperresonant  to  percussion bilaterally    CV:  RRR  no s3 or murmur or increase in P2, and no edema   ABD:  soft and nontender with pos end  insp Hoover's  in the supine position.  No bruits or organomegaly appreciated   MS:   xt warm without deformities Or obvious joint restrictions  calf tenderness, cyanosis or clubbing     SKIN: warm and dry without lesions    NEURO:  alert, approp, nl sensorium with  no motor or cerebellar deficits apparent.  Parkinsonian tremor           Assessment   DOE (dyspnea on exertion) Onset p covid 19 vax  March 2021 - myoview 09/11/20  Nl EF/ no ischemic changes  - echo 10/07/20 ok  - quit smoking 03/2021  - 08/27/2021   Walked on RA  x  1.5  lap(s) =  approx 225  ft  @ slow/ shuffling pace, stopped due to knee pain, legs gave out before breathing  with lowest 02 sats 100%  - 08/27/2021  After extensive coaching inhaler device,  effectiveness =   75% from baseline hfa of 25% > try breztri 2 pffs about 6 h before daily walk to MB pending f/u with pfts when available    Strongly doubt he has much copd and As I explained to this patient in detail:  although there may be copd present, it may not be clinically relevant because he is so sedentary   Given one container sample of breztri to take daily x 2 weeks and walk to MB w/in 6 h and see if does better or not pending PFTs / ordered.    Will need screening LDSCT chest for which he is eligible for the next 14 years (until age 72) but defer this to pcp as part of an annual health eval.    Each maintenance medication was reviewed in detail including emphasizing most importantly the difference between maintenance and prns and under what circumstances the prns are to be triggered using an action plan format where appropriate.  Total time for H and P, chart review, counseling, reviewing hfa device(s) , directly observing portions of ambulatory 02  saturation study/ and generating customized AVS unique to this office visit / same day charting  > 45 min for new pt with refractory respiratory  symptoms of uncertain etiology                   Bobby Gully, MD 08/27/2021

## 2021-09-22 ENCOUNTER — Ambulatory Visit (INDEPENDENT_AMBULATORY_CARE_PROVIDER_SITE_OTHER): Payer: No Typology Code available for payment source | Admitting: Internal Medicine

## 2021-09-22 DIAGNOSIS — R0609 Other forms of dyspnea: Secondary | ICD-10-CM

## 2021-09-22 LAB — PULMONARY FUNCTION TEST
DL/VA % pred: 71 %
DL/VA: 2.95 ml/min/mmHg/L
DLCO cor % pred: 57 %
DLCO cor: 15.4 ml/min/mmHg
DLCO unc % pred: 57 %
DLCO unc: 15.4 ml/min/mmHg
FEF 25-75 Post: 3.34 L/sec
FEF 25-75 Pre: 3.19 L/sec
FEF2575-%Change-Post: 4 %
FEF2575-%Pred-Post: 124 %
FEF2575-%Pred-Pre: 119 %
FEV1-%Change-Post: 0 %
FEV1-%Pred-Post: 91 %
FEV1-%Pred-Pre: 90 %
FEV1-Post: 3.1 L
FEV1-Pre: 3.09 L
FEV1FVC-%Change-Post: 1 %
FEV1FVC-%Pred-Pre: 109 %
FEV6-%Change-Post: -1 %
FEV6-%Pred-Post: 85 %
FEV6-%Pred-Pre: 86 %
FEV6-Post: 3.69 L
FEV6-Pre: 3.74 L
FEV6FVC-%Change-Post: 0 %
FEV6FVC-%Pred-Post: 103 %
FEV6FVC-%Pred-Pre: 104 %
FVC-%Change-Post: -1 %
FVC-%Pred-Post: 81 %
FVC-%Pred-Pre: 82 %
FVC-Post: 3.74 L
FVC-Pre: 3.79 L
Post FEV1/FVC ratio: 83 %
Post FEV6/FVC ratio: 99 %
Pre FEV1/FVC ratio: 82 %
Pre FEV6/FVC Ratio: 99 %
RV % pred: 84 %
RV: 2.01 L
TLC % pred: 80 %
TLC: 5.68 L

## 2021-09-22 NOTE — Progress Notes (Signed)
PFT done today. 

## 2021-09-25 ENCOUNTER — Telehealth: Payer: Self-pay | Admitting: Internal Medicine

## 2021-09-25 NOTE — Telephone Encounter (Signed)
It looks good and would not explain why he is having trouble breathing  - Be sure patient has/keeps f/u ov so we can go over all the details of this study and get a plan together moving forward - ok to move up f/u if not feeling better and wants to be seen sooner

## 2021-09-25 NOTE — Telephone Encounter (Signed)
Called and spoke with patient. He verbalized understanding. Patient made an appointment with dr. Melvyn Novas for Monday 09/28/2021.   Nothing further needed.

## 2021-09-25 NOTE — Telephone Encounter (Signed)
Called patient and he is wanting the result sof his pulmonary function test. Patient states that he did the test on 09/22/2021.   Please advise sir

## 2021-09-27 NOTE — Progress Notes (Unsigned)
Bobby York, male    DOB: December 27, 1955    MRN: [id number]   Brief patient profile:  48   yobm quit smoking 03/2021  referred to pulmonary clinic in [facility]  08/27/2021 by Bobby York  for ? Copd with doe starting p 1st covid injection March 2021 on a background of ?bipolar disorder with ? Movement disorder from phenothiazine RX       History of Present Illness  08/27/2021  Pulmonary/ 1st office eval/ Bobby York / [facility]  Chief Complaint  Patient presents with   Consult    Consult ref by Bobby. Hilma York for COPD- SOB for 8 months. Patient states he cannot walk more than 7-8 steps without getting lightheaded and out of breath.   Dyspnea:  MB 100 ft slt incline slow pace and stops then "hurries back downhill home before his breathing gives out no better on breztri but hfa quite poor technicque Cough: hoarse but no cough  Sleep: fine with hob up 30 degrees with pillows under mattress SABA use: none Rec Try breztri 2puffs each 9 am and keep walking to [employer] daily at  3pm If not improving after 2 weeks ok to stop breztri PFTs next available - ok to use [place] if up to driving there. Please remember to go to the  x-ray department   > min copd changes    09/28/2021  f/u ov/Aneesha York re: unexplained doe/ orthostatic hypotension   maint on breztri but didn't take it on day of ov   Chief Complaint  Patient presents with   Follow-up    Breathing is unchanged. He is c/o hoarseness- not rinsing mouth after his Breztri.   Dyspnea:  still doing mb about the same on breztri but more hoarse,lihgt headed x 6 months  Cough: none  Sleeping: bed is flat, pillow under mattress  SABA use: none  02: none  Covid status:   vax 3-4  Eating / drinking fine per wife s evidence aspiration or explanation for wt loss   No obvious day to day or daytime variability or assoc excess/ purulent sputum or mucus plugs or hemoptysis or cp or chest tightness, subjective wheeze or overt sinus or hb symptoms.    Sleeping  without nocturnal  or early am exacerbation  of respiratory  c/o's or need for noct saba. Also denies any obvious fluctuation of symptoms with weather or environmental changes or other aggravating or alleviating factors except as outlined above   No unusual exposure hx or h/o childhood pna/ asthma or knowledge of premature birth.  Current Allergies, Complete Past Medical History, Past Surgical History, Family History, and Social History were reviewed in [employer] record.  ROS  The following are not active complaints unless bolded Hoarseness, sore throat, dysphagia, dental problems, itching, sneezing,  nasal congestion or discharge of excess mucus or purulent secretions, ear ache,   fever, chills, sweats, unintended wt loss or wt gain, classically pleuritic or exertional cp,  orthopnea pnd or arm/hand swelling  or leg swelling, presyncope, palpitations, abdominal pain, anorexia, nausea, vomiting, diarrhea  or change in bowel habits or change in bladder habits, change in stools or change in urine, dysuria, hematuria,  rash, arthralgias, visual complaints, headache, numbness, weakness or ataxia or problems with walking or coordination,  change in mood or  memory. Tremor/ rigidity "they are adjusting his meds"  at the [place]         Current Meds  Medication Sig   Budeson-Glycopyrrol-Formoterol (BREZTRI AEROSPHERE) 160-9-4.8 MCG/ACT  AERO Inhale 2 puffs into the lungs in the morning and at bedtime.   Multiple Vitamin (MULTIVITAMIN ADULT PO) Take 1 tablet by mouth daily.   Probiotic Product (ALIGN PO) Take 1 capsule by mouth daily.   risperiDONE (RISPERDAL) 0.5 MG tablet TAKE ONE-HALF TABLET BY MOUTH DAILY FOR MENTAL HEALTH   traZODone (DESYREL) 50 MG tablet Take 50 mg by mouth at bedtime as needed for sleep.             Objective:     Wt Readings from Last 3 Encounters:  09/28/21 177 lb (80.3 kg)  08/27/21 182 lb 9.6 oz (82.8 kg)  04/01/21 191 lb (86.6 kg)       Vital signs reviewed  09/28/2021  - Note at rest 02 sats  97% on RA   General appearance:    chronically ill amb bm nad / a bit unsteady on his feet  / classic pseudowheeze some better with plm   HEENT : Oropharynx  clear     Nasal turbinates nl   NECK :  without  apparent JVD/ palpable Nodes/TM    LUNGS: no acc muscle use,  Nl contour chest which is clear to A and P bilaterally without cough on insp or exp maneuvers   CV:  RRR  no s3 or murmur or increase in P2, and no edema   ABD:  soft and nontender with nl inspiratory excursion in the supine position. No bruits or organomegaly appreciated   MS:   ext warm without deformities Or obvious joint restrictions  calf tenderness, cyanosis or clubbing    SKIN: warm and dry without lesions    NEURO:  alert, approp, nl sensorium with  no motor or cerebellar deficits apparent.  Parkinsonian  vs TD tremors/ rigidity    I personally reviewed images and agree with radiology impression as follows:  CXR:   PA and Lat   08/27/21 No active cardiopulmonary disease   Labs ordered/ reviewed:      Chemistry      Component Value Date/Time   NA 141 09/28/2021 1042   K 4.2 09/28/2021 1042   CL 103 09/28/2021 1042   CO2 25 09/28/2021 1042   BUN 15 09/28/2021 1042   CREATININE 1.20 09/28/2021 1042   CREATININE 1.06 06/02/2015 0921      Component Value Date/Time   CALCIUM 9.4 09/28/2021 1042   ALKPHOS 52 01/20/2021 0608   AST 29 01/20/2021 0608   ALT 22 01/20/2021 0608   BILITOT 0.8 01/20/2021 0608        Lab Results  Component Value Date   WBC 5.5 09/28/2021   HGB 13.3 09/28/2021   HCT 39.3 09/28/2021   MCV 91.8 09/28/2021   PLT 268.0 09/28/2021     Lab Results  Component Value Date   DDIMER 0.51 (H) 09/28/2021      Lab Results  Component Value Date   TSH 1.02 09/28/2021     Lab Results  Component Value Date   PROBNP 8.0 09/28/2021       Lab Results  Component Value Date   ESRSEDRATE 24 (H) 09/28/2021                 Assessment

## 2021-09-28 ENCOUNTER — Encounter: Payer: Self-pay | Admitting: Internal Medicine

## 2021-09-28 ENCOUNTER — Ambulatory Visit (INDEPENDENT_AMBULATORY_CARE_PROVIDER_SITE_OTHER): Payer: No Typology Code available for payment source | Admitting: Internal Medicine

## 2021-09-28 DIAGNOSIS — R0609 Other forms of dyspnea: Secondary | ICD-10-CM

## 2021-09-28 LAB — CBC WITH DIFFERENTIAL/PLATELET
Basophils Absolute: 0 10*3/uL (ref 0.0–0.1)
Basophils Relative: 0.5 % (ref 0.0–3.0)
Eosinophils Absolute: 0.3 10*3/uL (ref 0.0–0.7)
Eosinophils Relative: 5.4 % — ABNORMAL HIGH (ref 0.0–5.0)
HCT: 39.3 % (ref 39.0–52.0)
Hemoglobin: 13.3 g/dL (ref 13.0–17.0)
Lymphocytes Relative: 38.2 % (ref 12.0–46.0)
Lymphs Abs: 2.1 10*3/uL (ref 0.7–4.0)
MCHC: 33.8 g/dL (ref 30.0–36.0)
MCV: 91.8 fl (ref 78.0–100.0)
Monocytes Absolute: 0.4 10*3/uL (ref 0.1–1.0)
Monocytes Relative: 6.7 % (ref 3.0–12.0)
Neutro Abs: 2.7 10*3/uL (ref 1.4–7.7)
Neutrophils Relative %: 49.2 % (ref 43.0–77.0)
Platelets: 268 10*3/uL (ref 150.0–400.0)
RBC: 4.28 Mil/uL (ref 4.22–5.81)
RDW: 12.8 % (ref 11.5–15.5)
WBC: 5.5 10*3/uL (ref 4.0–10.5)

## 2021-09-28 LAB — BASIC METABOLIC PANEL
BUN: 15 mg/dL (ref 6–23)
CO2: 25 mEq/L (ref 19–32)
Calcium: 9.4 mg/dL (ref 8.4–10.5)
Chloride: 103 mEq/L (ref 96–112)
Creatinine, Ser: 1.2 mg/dL (ref 0.40–1.50)
GFR: 62.81 mL/min (ref >60.00)
Glucose, Bld: 88 mg/dL (ref 70–99)
Potassium: 4.2 mEq/L (ref 3.5–5.1)
Sodium: 141 mEq/L (ref 135–145)

## 2021-09-28 LAB — D-DIMER, QUANTITATIVE: D-Dimer, Quant: 0.51 mcg/mL FEU — ABNORMAL HIGH (ref <0.50)

## 2021-09-28 LAB — BRAIN NATRIURETIC PEPTIDE: Pro B Natriuretic peptide (BNP): 8 pg/mL (ref 0.0–100.0)

## 2021-09-28 LAB — TSH: TSH: 1.02 u[IU]/mL (ref 0.35–5.50)

## 2021-09-28 LAB — SEDIMENTATION RATE: Sed Rate: 24 mm/hr — ABNORMAL HIGH (ref 0–20)

## 2021-09-28 MED ORDER — FAMOTIDINE 20 MG PO TABS: ORAL_TABLET | ORAL | 11 refills | Status: DC

## 2021-09-28 MED ORDER — PANTOPRAZOLE SODIUM 40 MG PO TBEC: 40.0000 mg | DELAYED_RELEASE_TABLET | Freq: Every day | ORAL | 2 refills | Status: DC

## 2021-09-28 NOTE — Patient Instructions (Addendum)
Stop breztri   Start  Pantoprazole (protonix) 40 mg   Take  30-60 min before first meal of the day and Pepcid (famotidine)  20 mg after supper for at least a month  GERD (REFLUX)  is an extremely common cause of respiratory symptoms just like yours , many times with no obvious heartburn at all.    It can be treated with medication, but also with lifestyle changes including elevation of the head of your bed (ideally with 6 -8inch blocks under the headboard of your bed),  Smoking cessation, avoidance of late meals, excessive alcohol, and avoid fatty foods, chocolate, peppermint, colas, red wine, and acidic juices such as orange juice.  NO MINT OR MENTHOL PRODUCTS SO NO COUGH DROPS  USE SUGARLESS CANDY INSTEAD (Jolley ranchers or Stover's or Life Savers) or even ice chips will also do - the key is to swallow to prevent all throat clearing. NO OIL BASED VITAMINS - use powdered substitutes.  Avoid fish oil when coughing.   If not better I would suggest ENT eval thru [facility] blood pressure problems need to be addressed thru the [place]    Please remember to go to the lab department   for your tests - we will call you with the results when they are available.       Follow up as needed at [facility] clinic   .

## 2021-09-30 ENCOUNTER — Encounter: Payer: Self-pay | Admitting: Internal Medicine

## 2021-09-30 NOTE — Assessment & Plan Note (Signed)
Onset p covid 19 vax  March 2021 - quit smoking 03/2021  - 08/27/2021   Walked on RA  x  1.5  lap(s) =  approx 225  ft  @ slow/ shuffling pace, stopped due to knee pain, legs gave out before breathing  with lowest 02 sats 100%  - 08/27/2021  After extensive coaching inhaler device,  effectiveness =   75% from baseline hfa of 25% > try breztri 2 pffs about 6 h before daily walk to MB pending f/u with pfts when available  - PFT's  09/22/21   FEV1 3.10 (91 % ) ratio 0.83  p 0 % improvement from saba p breztri prior to study with DLCO  15.40 (57%)  and FV curve truncated insp loop with true plateau      Only finding is unexplained orthostasis, PD vs TD  and pseudowheeze on exam which correlates with PFTs abnormal only on Insp loop which can be seen in all but the first (would not explain orthostasis though this may be related to PD)  rec No further pulmonary eval  Return to [facility] for [facility] / bp eval asap./ falling precautions  Medical decision making was a moderatelyhigh level of complexity in this case because of a chronic condition/diagnosis with severe exacerbation progression side effects of treatment  requiring extra time for  H and P, chart review, counseling, for this final summary pulmonary eval  and generating customized AVS unique to this office visit and charting.

## 2021-11-13 ENCOUNTER — Other Ambulatory Visit: Payer: Self-pay | Admitting: Neurological Surgery

## 2021-11-13 DIAGNOSIS — D352 Benign neoplasm of pituitary gland: Secondary | ICD-10-CM

## 2021-12-03 ENCOUNTER — Ambulatory Visit
Admission: RE | Admit: 2021-12-03 | Discharge: 2021-12-03 | Disposition: A | Discharge status: Home / Self Care | Payer: No Typology Code available for payment source | Source: Ambulatory Visit | Attending: Neurological Surgery | Admitting: Neurological Surgery

## 2021-12-03 DIAGNOSIS — D352 Benign neoplasm of pituitary gland: Secondary | ICD-10-CM

## 2021-12-03 MED ORDER — GADOPICLENOL 0.5 MMOL/ML IV SOLN
7.5000 mL | Freq: Once | INTRAVENOUS | Status: AC | PRN
  Administered 2021-12-03: 7.5 mL via INTRAVENOUS

## 2021-12-10 ENCOUNTER — Telehealth: Payer: Self-pay | Admitting: *Deleted

## 2021-12-10 NOTE — Telephone Encounter (Signed)
   Name: Bobby York  DOB: 1955-02-23  MRN: [id number]  Primary Cardiologist: Jenkins Rouge, MD   Preoperative team, please contact this patient and set up a phone call appointment for further preoperative risk assessment. Please obtain consent and complete medication review. Thank you for your help.  I confirm that guidance regarding antiplatelet and oral anticoagulation therapy has been completed and, if necessary, noted below.  Patient not on any blood thinning agents or DAPT.   Deberah Pelton, NP 12/10/2021, 10:46 AM [name]

## 2021-12-10 NOTE — Telephone Encounter (Signed)
   Pre-operative Risk Assessment    Patient Name: Bobby York  DOB: 1955/05/15 MRN: [id number]      Request for Surgical Clearance    Procedure:   TRANSPHENOIDAL RESECTION  Date of Surgery:  Clearance TBD                                 Surgeon:  DR. Duffy Rhody Surgeon's Group or Practice Name:  [facility] Phone number:  [phone] Fax number:  [phone]   Type of Clearance Requested:   - Medical    Type of Anesthesia:  General    Additional requests/questions:    SignedJeanann Lewandowsky   12/10/2021, 8:00 AM

## 2021-12-10 NOTE — Telephone Encounter (Signed)
1st attempt to reach pt regarding surgical clearance and the need for a tele visit.  Left pt a message to call back and ask for the preop team.  

## 2021-12-11 ENCOUNTER — Other Ambulatory Visit (HOSPITAL_COMMUNITY)
Admission: RE | Admit: 2021-12-11 | Discharge: 2021-12-11 | Disposition: A | Discharge status: Home / Self Care | Payer: No Typology Code available for payment source | Source: Ambulatory Visit | Attending: Neurosurgery | Admitting: Neurosurgery

## 2021-12-11 DIAGNOSIS — D352 Benign neoplasm of pituitary gland: Secondary | ICD-10-CM | POA: Insufficient documentation

## 2021-12-11 LAB — TSH: TSH: 1.298 u[IU]/mL (ref 0.350–4.500)

## 2021-12-11 LAB — T4, FREE: Free T4: 0.72 ng/dL (ref 0.61–1.12)

## 2021-12-11 LAB — GLUCOSE, RANDOM: Glucose, Bld: 112 mg/dL — ABNORMAL HIGH (ref 70–99)

## 2021-12-11 LAB — CORTISOL: Cortisol, Plasma: 13 ug/dL

## 2021-12-11 NOTE — Telephone Encounter (Signed)
2nd attempt to reach patient to scheduled tele visit. LVM

## 2021-12-12 LAB — IGF BINDING PROTEIN 3, BLOOD: IGF Binding Protein 3: 3341 ug/L (ref 1926–5487)

## 2021-12-12 LAB — FOLLICLE STIMULATING HORMONE: FSH: 2.1 m[IU]/mL (ref 1.5–12.4)

## 2021-12-12 LAB — INSULIN-LIKE GROWTH FACTOR: Somatomedin C: 142 ng/mL (ref 59–230)

## 2021-12-12 LAB — LUTEINIZING HORMONE: LH: 3 m[IU]/mL (ref 1.7–8.6)

## 2021-12-12 LAB — PROLACTIN: Prolactin: 34.8 ng/mL — ABNORMAL HIGH (ref 4.0–15.2)

## 2021-12-14 ENCOUNTER — Telehealth: Payer: Self-pay | Admitting: *Deleted

## 2021-12-14 NOTE — Telephone Encounter (Signed)
Pt agreeable to plan of care for tele pre op appt 12/16/21 @ 3 pm. Med rec and consent are done.     Patient Consent for Virtual Visit        Bobby York has provided verbal consent on 12/14/2021 for a virtual visit (video or telephone).   CONSENT FOR VIRTUAL VISIT FOR:  Bobby York  By participating in this virtual visit I agree to the following:  I hereby voluntarily request, consent and authorize [facility] and its employed or contracted physicians, physician assistants, nurse practitioners or other licensed health care professionals (the Practitioner), to provide me with telemedicine health care services (the "Services") as deemed necessary by the treating Practitioner. I acknowledge and consent to receive the Services by the Practitioner via telemedicine. I understand that the telemedicine visit will involve communicating with the Practitioner through live audiovisual communication technology and the disclosure of certain medical information by electronic transmission. I acknowledge that I have been given the opportunity to request an in-person assessment or other available alternative prior to the telemedicine visit and am voluntarily participating in the telemedicine visit.  I understand that I have the right to withhold or withdraw my consent to the use of telemedicine in the course of my care at any time, without affecting my right to future care or treatment, and that the Practitioner or I may terminate the telemedicine visit at any time. I understand that I have the right to inspect all information obtained and/or recorded in the course of the telemedicine visit and may receive copies of available information for a reasonable fee.  I understand that some of the potential risks of receiving the Services via telemedicine include:  Delay or interruption in medical evaluation due to technological equipment failure or disruption; Information transmitted may not be  sufficient (e.g. poor resolution of images) to allow for appropriate medical decision making by the Practitioner; and/or  In rare instances, security protocols could fail, causing a breach of personal health information.  Furthermore, I acknowledge that it is my responsibility to provide information about my medical history, conditions and care that is complete and accurate to the best of my ability. I acknowledge that Practitioner's advice, recommendations, and/or decision may be based on factors not within their control, such as incomplete or inaccurate data provided by me or distortions of diagnostic images or specimens that may result from electronic transmissions. I understand that the practice of medicine is not an exact science and that Practitioner makes no warranties or guarantees regarding treatment outcomes. I acknowledge that a copy of this consent can be made available to me via my patient portal ([facility]), or I can request a printed copy by calling the office of [facility].    I understand that my insurance will be billed for this visit.   I have read or had this consent read to me. I understand the contents of this consent, which adequately explains the benefits and risks of the Services being provided via telemedicine.  I have been provided ample opportunity to ask questions regarding this consent and the Services and have had my questions answered to my satisfaction. I give my informed consent for the services to be provided through the use of telemedicine in my medical care

## 2021-12-14 NOTE — Telephone Encounter (Signed)
Pt agreeable to plan of care for tele pre op appt 12/16/21 @ 3 pm. Med rec and consent are done.

## 2021-12-16 ENCOUNTER — Encounter (HOSPITAL_BASED_OUTPATIENT_CLINIC_OR_DEPARTMENT_OTHER): Payer: Self-pay | Admitting: Family

## 2021-12-16 ENCOUNTER — Ambulatory Visit (INDEPENDENT_AMBULATORY_CARE_PROVIDER_SITE_OTHER): Payer: No Typology Code available for payment source | Admitting: Family

## 2021-12-16 DIAGNOSIS — Z01818 Encounter for other preprocedural examination: Secondary | ICD-10-CM

## 2021-12-16 NOTE — Progress Notes (Signed)
Virtual Visit via Telephone Note   Because of Bobby York's co-morbid illnesses, he is at least at moderate risk for complications without adequate follow up.  This format is felt to be most appropriate for this patient at this time.  The patient did not have access to video technology/had technical difficulties with video requiring transitioning to audio format only (telephone).  All issues noted in this document were discussed and addressed.  No physical exam could be performed with this format.  Please refer to the patient's chart for his consent to telehealth for [facility].    Date:  12/16/2021   ID:  Bobby York, DOB 01-19-1956, MRN [id number] The patient was identified using 2 identifiers.  Patient Location: Home Provider Location: Home Office   PCP:  [name], [facility] Cardiologist:  Jenkins Rouge, MD     Evaluation Performed:  Follow-Up Visit  Chief Complaint:  Preop clearance  History of Present Illness:    Bobby York is a 66 y.o. male with bipolar disease, hepatitis, etoh/cocaine use, HLD. He is an Scientist, research (life sciences). . Last seen 04/01/21 by Dr. [name]. Per that note he had orthostasis and worsening PTSD which could be managed by primary, neurology, and behavioral health at [facility]. Workup for dyspnea negative for cardiac etiology. His midodrine dose was increased to '10mg'$  TID for his orthostasis.   Presents today for preoperative clearance for transphenoidal resection.   He reports no chest pain. He feels dyspnea on exertion for the last 6-7 months. It is overall unchanged compared to when he saw Dr. [name]. Prior myoview 08/2020 and echo 09/2020 unremarkable. Reports no orthopnea, PND. He reports some lightheadedness which is unchanged but no near syncope, syncope. He is no longer taking Midodrine at the direction of his [facility] as his provider as it was not raising his blood pressure. He is drinking electrolyte supplements to help  stay hydrated.   Past Medical History:  Diagnosis Date   Bipolar 1 disorder (Bobby York)    diagnosed 2004   Hepatitis    Hep C   Past Surgical History:  Procedure Laterality Date   BACK SURGERY     two-lumbar disc x2   CLAVICLE SURGERY     COLONOSCOPY WITH PROPOFOL N/A 07/25/2014   GGY:IRSWNI rectal polyp otherwise normal . hyperplastic polyp. TCS in 07/2024   ESOPHAGEAL DILATION N/A 07/25/2014   Procedure: ESOPHAGEAL DILATION;  Surgeon: Daneil Dolin, MD;  Location: [facility];  Service: Endoscopy;  Laterality: N/A;  Malony dilators # 56,   ESOPHAGOGASTRODUODENOSCOPY (EGD) WITH PROPOFOL N/A 07/25/2014   OEV:OJJK erosive reflux/s/p dilator/HH. +hpylor    NECK SURGERY       Current Meds  Medication Sig   famotidine (PEPCID) 20 MG tablet One after supper   Multiple Vitamin (MULTIVITAMIN ADULT PO) Take 1 tablet by mouth daily.   pantoprazole (PROTONIX) 40 MG tablet Take 1 tablet (40 mg total) by mouth daily. Take 30-60 min before first meal of the day   Probiotic Product (ALIGN PO) Take 1 capsule by mouth daily.   risperiDONE (RISPERDAL) 0.5 MG tablet TAKE ONE-HALF TABLET BY MOUTH DAILY FOR MENTAL HEALTH   traZODone (DESYREL) 50 MG tablet Take 50 mg by mouth at bedtime as needed for sleep.     Allergies:   Patient has no known allergies.   Social History   Tobacco Use   Smoking status: Former    Packs/day: 0.40    Years: 40.00  Total pack years: 16.00    Types: Cigarettes    Start date: 05/28/1970    Quit date: 03/2021    Years since quitting: 0.7   Smokeless tobacco: Never   Tobacco comments:    Pt states he stopped smoking about 4-5 months ago // mr CMA 08/27/2021   Substance Use Topics   Alcohol use: Not Currently    Alcohol/week: 0.0 standard drinks of alcohol   Drug use: Yes    Types: Marijuana     Family Hx: The patient's family history includes Diabetes in his mother; Heart disease in his mother; Prostate cancer in his father. There is no history of Liver disease or Colon  cancer.  ROS:   Please see the history of present illness.     All other systems reviewed and are negative.   Prior CV studies:   The following studies were reviewed today:  TTE 10/07/20 normal EF 60-65%  Myovue 09/11/20 normal no ischemia EF 54%   Labs/Other Tests and Data Reviewed:    EKG:  No ECG reviewed.  Recent Labs: 01/20/2021: ALT 22 09/28/2021: BUN 15; Creatinine, Ser 1.20; Hemoglobin 13.3; Platelets 268.0; Potassium 4.2; Pro B Natriuretic peptide (BNP) 8.0; Sodium 141 12/11/2021: TSH 1.298   Recent Lipid Panel No results found for: "CHOL", "TRIG", "HDL", "CHOLHDL", "LDLCALC", "LDLDIRECT"  Wt Readings from Last 3 Encounters:  09/28/21 177 lb (80.3 kg)  08/27/21 182 lb 9.6 oz (82.8 kg)  04/01/21 191 lb (86.6 kg)        Objective:    Vital Signs:  No vital signs available for review.  ASSESSMENT & PLAN:    Preoperative clearance - According to the Revised Cardiac Risk Index (RCRI), his Perioperative Risk of Major Cardiac Event is (%): 0.4. His Functional Capacity in METs is: 4.3 according to the [facility] Activity Status Index (DASI). Per AHA/ACC guidelines, he is deemed acceptable risk for the planned procedure without additional cardiovascular testing. Will route to surgical team so they are aware.   Antiplatelet/Anticoagulant: Does not take antiplatelet nor anticoagulant that would need to be stopped prior to procedure.    Time:   Today, I have spent 7 minutes with the patient with telehealth technology discussing the above problems.     Medication Adjustments/Labs and Tests Ordered: Current medicines are reviewed at length with the patient today.  Concerns regarding medicines are outlined above.   Tests Ordered: No orders of the defined types were placed in this encounter.   Medication Changes: No orders of the defined types were placed in this encounter.   Follow Up:  In Person prn with Dr. [name]  Signed, Loel Dubonnet, NP  12/16/2021 3:10 PM     [place]

## 2022-01-28 ENCOUNTER — Other Ambulatory Visit: Payer: Self-pay | Admitting: Neurosurgery

## 2022-02-09 ENCOUNTER — Other Ambulatory Visit: Payer: Self-pay | Admitting: Otolaryngology

## 2022-03-01 ENCOUNTER — Other Ambulatory Visit: Payer: Self-pay | Admitting: Neurosurgery

## 2022-03-08 NOTE — Progress Notes (Signed)
Surgical Instructions    Your procedure is scheduled on Thursday, January 19th, 2024.  Report to [facility] Main Entrance "A" at 5:30 A.M., then check in with the Admitting office.  Call this number if you have problems the morning of surgery:  [phone]   If you have any questions prior to your surgery date call [phone]: Open Monday-Friday 8am-4pm If you experience any cold or flu symptoms such as cough, fever, chills, shortness of breath, etc. between now and your scheduled surgery, please notify us at the above number     Remember:  Do not eat or drink after midnight the night before your surgery    Take these medicines the morning of surgery with A SIP OF WATER: None   As of today, STOP taking any Aspirin (unless otherwise instructed by your surgeon) Aleve, Naproxen, Ibuprofen, Motrin, Advil, Goody's, BC's, all herbal medications, fish oil, and all vitamins.           Do not wear jewelry or makeup. Do not wear lotions, powders, perfumes/cologne or deodorant. Do not shave 48 hours prior to surgery.  Men may shave face and neck. Do not bring valuables to the hospital. Do not wear nail polish, gel polish, artificial nails, or any other type of covering on natural nails (fingers and toes) If you have artificial nails or gel coating that need to be removed by a nail salon, please have this removed prior to surgery. Artificial nails or gel coating may interfere with anesthesia's ability to adequately monitor your vital signs.  [facility] is not responsible for any belongings or valuables.    Do NOT Smoke (Tobacco/Vaping)  24 hours prior to your procedure  If you use a CPAP at night, you may bring your mask for your overnight stay.   Contacts, glasses, hearing aids, dentures or partials may not be worn into surgery, please bring cases for these belongings   For patients admitted to the hospital, discharge time will be determined by your treatment team.   Patients  discharged the day of surgery will not be allowed to drive home, and someone needs to stay with them for 24 hours.   SURGICAL WAITING ROOM VISITATION Patients having surgery or a procedure may have no more than 2 support people in the waiting area - these visitors may rotate.   Children under the age of 87 must have an adult with them who is not the patient. If the patient needs to stay at the hospital during part of their recovery, the visitor guidelines for inpatient rooms apply. Pre-op nurse will coordinate an appropriate time for 1 support person to accompany patient in pre-op.  This support person may not rotate.   Please refer to [email or web address] for the visitor guidelines for Inpatients (after your surgery is over and you are in a regular room).    Special instructions:    Oral Hygiene is also important to reduce your risk of infection.  Remember - BRUSH YOUR TEETH THE MORNING OF SURGERY WITH YOUR REGULAR TOOTHPASTE   - Preparing For Surgery  Before surgery, you can play an important role. Because skin is not sterile, your skin needs to be as free of germs as possible. You can reduce the number of germs on your skin by washing with CHG (chlorahexidine gluconate) Soap before surgery.  CHG is an antiseptic cleaner which kills germs and bonds with the skin to continue killing germs even after washing.     Please do not use if  you have an allergy to CHG or antibacterial soaps. If your skin becomes reddened/irritated stop using the CHG.  Do not shave (including legs and underarms) for at least 48 hours prior to first CHG shower. It is OK to shave your face.  Please follow these instructions carefully.     Shower the NIGHT BEFORE SURGERY and the MORNING OF SURGERY with CHG Soap.   If you chose to wash your hair, wash your hair first as usual with your normal shampoo. After you shampoo, rinse your hair and body thoroughly to  remove the shampoo.  Then [employer] and genitals (private parts) with your normal soap and rinse thoroughly to remove soap.  After that Use CHG Soap as you would any other liquid soap. You can apply CHG directly to the skin and wash gently with a scrungie or a clean washcloth.   Apply the CHG Soap to your body ONLY FROM THE NECK DOWN.  Do not use on open wounds or open sores. Avoid contact with your eyes, ears, mouth and genitals (private parts). Wash Face and genitals (private parts)  with your normal soap.   Wash thoroughly, paying special attention to the area where your surgery will be performed.  Thoroughly rinse your body with warm water from the neck down.  DO NOT shower/wash with your normal soap after using and rinsing off the CHG Soap.  Pat yourself dry with a CLEAN TOWEL.  Wear CLEAN PAJAMAS to bed the night before surgery  Place CLEAN SHEETS on your bed the night before your surgery  DO NOT SLEEP WITH PETS.   Day of Surgery:  Take a shower with CHG soap. Wear Clean/Comfortable clothing the morning of surgery Do not apply any deodorants/lotions.   Remember to brush your teeth WITH YOUR REGULAR TOOTHPASTE.    If you received a COVID test during your pre-op visit, it is requested that you wear a mask when out in public, stay away from anyone that may not be feeling well, and notify your surgeon if you develop symptoms. If you have been in contact with anyone that has tested positive in the last 10 days, please notify your surgeon.    Please read over the following fact sheets that you were given.

## 2022-03-09 ENCOUNTER — Encounter (HOSPITAL_COMMUNITY): Payer: Self-pay

## 2022-03-09 ENCOUNTER — Encounter (HOSPITAL_COMMUNITY)
Admission: RE | Admit: 2022-03-09 | Discharge: 2022-03-09 | Disposition: A | Discharge status: Home / Self Care | Payer: No Typology Code available for payment source | Source: Ambulatory Visit | Attending: Neurosurgery | Admitting: Neurosurgery

## 2022-03-09 ENCOUNTER — Other Ambulatory Visit: Payer: Self-pay

## 2022-03-09 VITALS — BP 85/58 | HR 97 | Temp 98.1°F | Resp 19 | Ht 71.0 in | Wt 188.4 lb

## 2022-03-09 DIAGNOSIS — Z01818 Encounter for other preprocedural examination: Secondary | ICD-10-CM

## 2022-03-09 DIAGNOSIS — Z01812 Encounter for preprocedural laboratory examination: Secondary | ICD-10-CM | POA: Insufficient documentation

## 2022-03-09 HISTORY — DX: Dyspnea, unspecified: R06.00

## 2022-03-09 LAB — COMPREHENSIVE METABOLIC PANEL
ALT: 16 U/L (ref 0–44)
AST: 24 U/L (ref 15–41)
Albumin: 3.7 g/dL (ref 3.5–5.0)
Alkaline Phosphatase: 51 U/L (ref 38–126)
Anion gap: 8 (ref 5–15)
BUN: 13 mg/dL (ref 8–23)
CO2: 25 mmol/L (ref 22–32)
Calcium: 8.9 mg/dL (ref 8.9–10.3)
Chloride: 105 mmol/L (ref 98–111)
Creatinine, Ser: 1.18 mg/dL (ref 0.61–1.24)
GFR, Estimated: >60 mL/min (ref >60)
Glucose, Bld: 113 mg/dL — ABNORMAL HIGH (ref 70–99)
Potassium: 3.7 mmol/L (ref 3.5–5.1)
Sodium: 138 mmol/L (ref 135–145)
Total Bilirubin: 0.5 mg/dL (ref 0.3–1.2)
Total Protein: 6.9 g/dL (ref 6.5–8.1)

## 2022-03-09 LAB — TYPE AND SCREEN
ABO/RH(D): O NEG
Antibody Screen: NEGATIVE

## 2022-03-09 LAB — CBC
HCT: 40.3 % (ref 39.0–52.0)
Hemoglobin: 13 g/dL (ref 13.0–17.0)
MCH: 30.2 pg (ref 26.0–34.0)
MCHC: 32.3 g/dL (ref 30.0–36.0)
MCV: 93.7 fL (ref 80.0–100.0)
Platelets: 274 10*3/uL (ref 150–400)
RBC: 4.3 MIL/uL (ref 4.22–5.81)
RDW: 12 % (ref 11.5–15.5)
WBC: 4.9 10*3/uL (ref 4.0–10.5)
nRBC: 0 % (ref 0.0–0.2)

## 2022-03-09 NOTE — Progress Notes (Signed)
PCP -  [name], MD Cardiologist -  Jenkins Rouge, MD  PPM/ICD - Denies  Chest x-ray - 08/27/2021 EKG - 04/01/2021 Stress Test - 09/11/2020 ECHO - 10/07/2020 Cardiac Cath - Denies  Sleep Study - Denies  DM: Denies   Blood Thinner Instructions: N/A Aspirin Instructions:N/A  ERAS Protcol - NPO PRE-SURGERY Ensure or G2- N/A  COVID TEST- No   Anesthesia review: Yes, during patients PAT appointment, blood pressure was 77/52. Recheck was 85/58. [name], PA-C with anesthesia notified. Patient reports this is a chronic problem lasting the last 8 months. Patient reports feeling dizzy at appointment.   Patient denies shortness of breath, fever, cough and chest pain at PAT appointment   All instructions explained to the patient, with a verbal understanding of the material. Patient agrees to go over the instructions while at home for a better understanding. Patient also instructed to self quarantine after being tested for COVID-19. The opportunity to ask questions was provided.

## 2022-03-10 NOTE — Progress Notes (Signed)
Anesthesia Chart Review:  Pt has recently been evaluated by both cardiology and pulmonology for DOE and orthostatic hypotension. Reportedly, DOE started after 1st dose of Covid 19 vaccination in March of 2021. Myoview 08/2020 was nonischemic. Echo 09/2020 was essentially normal. He was seen via televisit by Laurann Montana, NP 12/16/21 for preop eval. Per note, "Preoperative clearance - According to the Revised Cardiac Risk Index (RCRI), his Perioperative Risk of Major Cardiac Event is (%): 0.4. His Functional Capacity in METs is: 4.3 according to the Duke Activity Status Index (DASI). Per AHA/ACC guidelines, he is deemed acceptable risk for the planned procedure without additional cardiovascular testing. Will route to surgical team so they are aware."  He has also been evaluated by pulmonologist Dr. Melvyn Novas. On 08/27/2021 Walked on RA x 1.5 lap(s) = approx 225 ft @ slow/ shuffling pace, stopped due to knee pain, legs gave out before breathing with lowest 02 sats 100%. PFTs showed FVC, FEV1, FEV1/FVC ratio and FEF25-75% to be within normal limits. Lung volumes were within normal limits. No bronchodilator response. Reduced diffusing capacity indicated a moderate loss of functional alveolar capillary surface. Pt was last seen by Dr. Melvyn Novas 09/28/21. At that time there was some concern for Parkinsonian vs TD tremors/rigidity. Per note, "Only finding is unexplained orthostasis, PD vs TD  and pseudowheeze on exam which correlates with PFTs abnormal only on Insp loop which can be seen in all but the first (would not explain orthostasis though this may be related to PD). Rec No further pulmonary eval. Return to [facility] for ENT / bp eval asap./ falling precautions." At that time the pt's Judithann Sauger was discontinue and he was started on pantoprazole for possible silent GERD.   Patient has been followed by neurosurgery for a pituitary adenoma that recently showed interval growth and surgical resection was recommended.   Pt reported DOE  to be stable at preop visit. He continues to feel winded after walking short distances. He says there has been no change in this since being evaluated by pulmonology and cardiology. He says his providers at the [place] have been following this as well as his hypotension. He states he feels chronically a little lightheaded and unsteady on his feet; says this is also currently as baseline and has been discussed with his providers at the [place].  BP Readings from Last 3 Encounters:  03/09/22 (!) 85/58  09/28/21 (!) 82/60  08/27/21 128/84   Preop labs reviewed, WNL.   EKG 04/01/21: Sinus  Rhythm. Rate 79. Nonspecific ST depression   CHEST - 2 VIEW 08/27/21: COMPARISON:  Chest radiograph Jul 15, 2020   FINDINGS: Stable cardiac and mediastinal contours. No consolidative pulmonary opacities. No pleural effusion or pneumothorax. Old right rib fractures.   IMPRESSION: No active cardiopulmonary disease.  TTE 10/07/20:  1. Left ventricular ejection fraction, by estimation, is 60 to 65%. The  left ventricle has normal function. The left ventricle has no regional  wall motion abnormalities. There is mild left ventricular hypertrophy.  Left ventricular diastolic parameters  were normal.   2. Right ventricular systolic function is normal. The right ventricular  size is normal. There is normal pulmonary artery systolic pressure. The  estimated right ventricular systolic pressure is 63.8 mmHg.   3. The mitral valve is abnormal, mildly thickened. Trivial mitral valve  regurgitation. No evidence of mitral stenosis. The mean mitral valve  gradient is 2.0 mmHg.   4. The aortic valve is tricuspid. Aortic valve regurgitation is not  visualized. Aortic valve mean  gradient measures 3.0 mmHg.   5. The inferior vena cava is normal in size with greater than 50%  respiratory variability, suggesting right atrial pressure of 3 mmHg.   Nuclear stress 09/11/20: No diagnostic ST segment changes to indicate ischemia. No  significant myocardial perfusion defects to indicate scar or ischemia. This is a low risk study. Nuclear stress EF: 54%.   Wynonia Musty [facility] Short Stay Center/Anesthesiology Phone [phone] 03/10/2022 3:38 PM

## 2022-03-10 NOTE — Anesthesia Preprocedure Evaluation (Addendum)
Anesthesia Evaluation  Patient identified by MRN, date of birth, ID band Patient awake    Reviewed: Allergy & Precautions, NPO status , Patient's Chart, lab work & pertinent test results  History of Anesthesia Complications Negative for: history of anesthetic complications  Airway Mallampati: III  TM Distance: >3 FB Neck ROM: Full    Dental  (+) Edentulous Upper, Missing, Dental Advisory Given,    Pulmonary shortness of breath, former smoker   breath sounds clear to auscultation       Cardiovascular + DOE   Rhythm:Regular     Neuro/Psych  PSYCHIATRIC DISORDERS   Bipolar Disorder   Pituitary mass  Tremor at rest    GI/Hepatic negative GI ROS,,,(+) Hepatitis -, C  Endo/Other  negative endocrine ROS    Renal/GU Lab Results      Component                Value               Date                      CREATININE               1.18                03/09/2022                Musculoskeletal negative musculoskeletal ROS (+)    Abdominal   Peds  Hematology negative hematology ROS (+) Lab Results      Component                Value               Date                      WBC                      4.9                 03/09/2022                HGB                      13.0                03/09/2022                HCT                      40.3                03/09/2022                MCV                      93.7                03/09/2022                PLT                      274                 03/09/2022              Anesthesia Other Findings Pt has recently been evaluated by both  cardiology and pulmonology for DOE and orthostatic hypotension. Reportedly, DOE started after 1st dose of Covid 19 vaccination in March of 2021. Myoview 08/2020 was nonischemic. Echo 09/2020 was essentially normal. He was seen via televisit by Laurann Montana, NP 12/16/21 for preop eval. Per note, "Preoperative clearance - According to the Revised  Cardiac Risk Index (RCRI), his Perioperative Risk of Major Cardiac Event is (%): 0.4. His Functional Capacity in METs is: 4.3 according to the Duke Activity Status Index (DASI). Per AHA/ACC guidelines, he is deemed acceptable risk for the planned procedure without additional cardiovascular testing. Will route to surgical team so they are aware."   He has also been evaluated by pulmonologist Dr. Melvyn Novas. On 08/27/2021 Walked on RA x 1.5 lap(s) = approx 225 ft @ slow/ shuffling pace, stopped due to knee pain, legs gave out before breathing with lowest 02 sats 100%. PFTs showed FVC, FEV1, FEV1/FVC ratio and FEF25-75% to be within normal limits. Lung volumes were within normal limits. No bronchodilator response. Reduced diffusing capacity indicated a moderate loss of functional alveolar capillary surface. Pt was last seen by Dr. Melvyn Novas 09/28/21. At that time there was some concern for Parkinsonian vs TD tremors/rigidity. Per note, "Only finding is unexplained orthostasis, PD vs TD  and pseudowheeze on exam which correlates with PFTs abnormal only on Insp loop which can be seen in all but the first (would not explain orthostasis though this may be related to PD). Rec No further pulmonary eval. Return to [facility] for ENT / bp eval asap./ falling precautions." At that time the pt's Judithann Sauger was discontinue and he was started on pantoprazole for possible silent GERD.    Patient has been followed by neurosurgery for a pituitary adenoma that recently showed interval growth and surgical resection was recommended.    Pt reported DOE to be stable at preop visit. He continues to feel winded after walking short distances. He says there has been no change in this since being evaluated by pulmonology and cardiology. He says his providers at the [place] have been following this as well as his hypotension. He states he feels chronically a little lightheaded and unsteady on his feet; says this is also currently as baseline and has been discussed with  his providers at the [place].   Reproductive/Obstetrics                              Anesthesia Physical Anesthesia Plan  ASA: 3  Anesthesia Plan: General   Post-op Pain Management: Minimal or no pain anticipated   Induction: Intravenous  PONV Risk Score and Plan: 2 and Ondansetron and Dexamethasone  Airway Management Planned: Oral ETT  Additional Equipment: Arterial line  Intra-op Plan:   Post-operative Plan: Extubation in OR and Possible Post-op intubation/ventilation  Informed Consent: I have reviewed the patients History and Physical, chart, labs and discussed the procedure including the risks, benefits and alternatives for the proposed anesthesia with the patient or authorized representative who has indicated his/her understanding and acceptance.     Dental advisory given  Plan Discussed with: CRNA  Anesthesia Plan Comments: (PAT note by [name], PA-C: Pt has recently been evaluated by both cardiology and pulmonology for DOE and orthostatic hypotension. Reportedly, DOE started after 1st dose of Covid 19 vaccination in March of 2021. Myoview 08/2020 was nonischemic. Echo 09/2020 was essentially normal. He was seen via televisit by Laurann Montana, NP 12/16/21 for preop eval. Per note, "Preoperative clearance -  According to the Revised Cardiac Risk Index (RCRI), his Perioperative Risk of Major Cardiac Event is (%): 0.4. His Functional Capacity in METs is: 4.3 according to the Duke Activity Status Index (DASI). Per AHA/ACC guidelines, he is deemed acceptable risk for the planned procedure without additional cardiovascular testing. Will route to surgical team so they are aware."  He has also been evaluated by pulmonologist Dr. Melvyn Novas. On 08/27/2021 Walked on RA x 1.5 lap(s) = approx 225 ft @ slow/ shuffling pace, stopped due to knee pain, legs gave out before breathing with lowest 02 sats 100%. PFTs showed FVC, FEV1, FEV1/FVC ratio and FEF25-75% to be within  normal limits. Lung volumes were within normal limits. No bronchodilator response. Reduced diffusing capacity indicated a moderate loss of functional alveolar capillary surface. Pt was last seen by Dr. Melvyn Novas 09/28/21. At that time there was some concern for Parkinsonian vs TD tremors/rigidity. Per note, "Only finding is unexplained orthostasis, PD vs TD  and pseudowheeze on exam which correlates with PFTs abnormal only on Insp loop which can be seen in all but the first (would not explain orthostasis though this may be related to PD). Rec No further pulmonary eval. Return to [facility] for [facility]NT / bp eval asap./ falling precautions." At that time the pt's Judithann Sauger was discontinue and he was started on pantoprazole for possible silent GERD.   Patient has been followed by neurosurgery for a pituitary adenoma that recently showed interval growth and surgical resection was recommended.   Pt reported DOE to be stable at preop visit. He continues to feel winded after walking short distances. He says there has been no change in this since being evaluated by pulmonology and cardiology. He says his providers at the [place] have been following this as well as his hypotension. He states he feels chronically a little lightheaded and unsteady on his feet; says this is also currently as baseline and has been discussed with his providers at the [place].  BP Readings from Last 3 Encounters: 03/09/22 (!) 85/58 09/28/21 (!) 82/60 08/27/21 128/84  Preop labs reviewed, WNL.   EKG 04/01/21: Sinus  Rhythm. Rate 79. Nonspecific ST depression   CHEST - 2 VIEW 08/27/21: COMPARISON:  Chest radiograph Jul 15, 2020   FINDINGS: Stable cardiac and mediastinal contours. No consolidative pulmonary opacities. No pleural effusion or pneumothorax. Old right rib fractures.   IMPRESSION: No active cardiopulmonary disease.  TTE 10/07/20:  1. Left ventricular ejection fraction, by estimation, is 60 to 65%. The  left ventricle has normal function. The left  ventricle has no regional  wall motion abnormalities. There is mild left ventricular hypertrophy.  Left ventricular diastolic parameters  were normal.   2. Right ventricular systolic function is normal. The right ventricular  size is normal. There is normal pulmonary artery systolic pressure. The  estimated right ventricular systolic pressure is 36.6 mmHg.   3. The mitral valve is abnormal, mildly thickened. Trivial mitral valve  regurgitation. No evidence of mitral stenosis. The mean mitral valve  gradient is 2.0 mmHg.   4. The aortic valve is tricuspid. Aortic valve regurgitation is not  visualized. Aortic valve mean gradient measures 3.0 mmHg.   5. The inferior vena cava is normal in size with greater than 50%  respiratory variability, suggesting right atrial pressure of 3 mmHg.   Nuclear stress 09/11/20: ? No diagnostic ST segment changes to indicate ischemia. ? No significant myocardial perfusion defects to indicate scar or ischemia. ? This is a low risk study. ? Nuclear  stress EF: 54%.  )         Anesthesia Quick Evaluation

## 2022-03-12 ENCOUNTER — Inpatient Hospital Stay (HOSPITAL_COMMUNITY): Payer: No Typology Code available for payment source | Admitting: Physician Assistant

## 2022-03-12 ENCOUNTER — Encounter (HOSPITAL_COMMUNITY)
Admission: RE | Disposition: A | Discharge status: Home / Self Care | Payer: Self-pay | Source: Ambulatory Visit | Attending: Neurosurgery

## 2022-03-12 ENCOUNTER — Inpatient Hospital Stay (HOSPITAL_COMMUNITY)
Admission: RE | Admit: 2022-03-12 | Discharge: 2022-03-14 | DRG: 615 | Disposition: A | Discharge status: Home / Self Care | Payer: No Typology Code available for payment source | Source: Ambulatory Visit | Attending: Neurosurgery | Admitting: Neurosurgery

## 2022-03-12 ENCOUNTER — Other Ambulatory Visit: Payer: Self-pay

## 2022-03-12 ENCOUNTER — Encounter (HOSPITAL_COMMUNITY): Payer: Self-pay

## 2022-03-12 DIAGNOSIS — D352 Benign neoplasm of pituitary gland: Secondary | ICD-10-CM

## 2022-03-12 DIAGNOSIS — Z833 Family history of diabetes mellitus: Secondary | ICD-10-CM

## 2022-03-12 DIAGNOSIS — E236 Other disorders of pituitary gland: Secondary | ICD-10-CM | POA: Diagnosis present

## 2022-03-12 DIAGNOSIS — Z8249 Family history of ischemic heart disease and other diseases of the circulatory system: Secondary | ICD-10-CM

## 2022-03-12 DIAGNOSIS — Z87891 Personal history of nicotine dependence: Secondary | ICD-10-CM

## 2022-03-12 DIAGNOSIS — F319 Bipolar disorder, unspecified: Secondary | ICD-10-CM | POA: Diagnosis present

## 2022-03-12 DIAGNOSIS — Z8042 Family history of malignant neoplasm of prostate: Secondary | ICD-10-CM | POA: Diagnosis not present

## 2022-03-12 DIAGNOSIS — Z79899 Other long term (current) drug therapy: Secondary | ICD-10-CM | POA: Diagnosis not present

## 2022-03-12 DIAGNOSIS — D497 Neoplasm of unspecified behavior of endocrine glands and other parts of nervous system: Secondary | ICD-10-CM | POA: Diagnosis present

## 2022-03-12 HISTORY — PX: TRANSPHENOIDAL APPROACH EXPOSURE: SHX6311

## 2022-03-12 HISTORY — PX: CRANIOTOMY: SHX93

## 2022-03-12 LAB — ABO/RH: ABO/RH(D): O NEG

## 2022-03-12 LAB — MRSA NEXT GEN BY PCR, NASAL: MRSA by PCR Next Gen: DETECTED — AB

## 2022-03-12 SURGERY — CRANIOTOMY HYPOPHYSECTOMY TRANSNASAL APPROACH
Anesthesia: General | Site: Nose

## 2022-03-12 MED ORDER — BISACODYL 5 MG PO TBEC: 5.0000 mg | DELAYED_RELEASE_TABLET | Freq: Every day | ORAL | Status: DC | PRN

## 2022-03-12 MED ORDER — CHLORHEXIDINE GLUCONATE CLOTH 2 % EX PADS: 6.0000 | MEDICATED_PAD | Freq: Once | CUTANEOUS | Status: DC

## 2022-03-12 MED ORDER — FAMOTIDINE 20 MG PO TABS
20.0000 mg | ORAL_TABLET | Freq: Every day | ORAL | Status: DC
  Administered 2022-03-12 – 2022-03-13 (×2): 20 mg via ORAL
  Filled 2022-03-12 (×2): qty 1

## 2022-03-12 MED ORDER — FLEET ENEMA 7-19 GM/118ML RE ENEM: 1.0000 | ENEMA | Freq: Once | RECTAL | Status: DC | PRN

## 2022-03-12 MED ORDER — MORPHINE SULFATE (PF) 2 MG/ML IV SOLN
1.0000 mg | INTRAVENOUS | Status: DC | PRN
  Administered 2022-03-12 – 2022-03-13 (×3): 2 mg via INTRAVENOUS
  Filled 2022-03-12: qty 1

## 2022-03-12 MED ORDER — FENTANYL CITRATE (PF) 100 MCG/2ML IJ SOLN
25.0000 ug | INTRAMUSCULAR | Status: DC | PRN
  Administered 2022-03-12 (×3): 50 ug via INTRAVENOUS

## 2022-03-12 MED ORDER — THROMBIN 20000 UNITS EX SOLR: CUTANEOUS | Status: DC | PRN

## 2022-03-12 MED ORDER — FENTANYL CITRATE (PF) 250 MCG/5ML IJ SOLN
INTRAMUSCULAR | Status: AC
  Filled 2022-03-12: qty 5

## 2022-03-12 MED ORDER — ACETAMINOPHEN 160 MG/5ML PO SOLN: 1000.0000 mg | Freq: Once | ORAL | Status: DC | PRN

## 2022-03-12 MED ORDER — 0.9 % SODIUM CHLORIDE (POUR BTL) OPTIME
TOPICAL | Status: DC | PRN
  Administered 2022-03-12: 1000 mL

## 2022-03-12 MED ORDER — POTASSIUM CHLORIDE IN NACL 20-0.9 MEQ/L-% IV SOLN
INTRAVENOUS | Status: DC
  Filled 2022-03-12 (×2): qty 1000

## 2022-03-12 MED ORDER — ORAL CARE MOUTH RINSE: 15.0000 mL | Freq: Once | OROMUCOSAL | Status: AC

## 2022-03-12 MED ORDER — HEMOSTATIC AGENTS (NO CHARGE) OPTIME
TOPICAL | Status: DC | PRN
  Administered 2022-03-12: 1 via TOPICAL

## 2022-03-12 MED ORDER — FENTANYL CITRATE (PF) 250 MCG/5ML IJ SOLN
INTRAMUSCULAR | Status: DC | PRN
  Administered 2022-03-12: 50 ug via INTRAVENOUS
  Administered 2022-03-12: 150 ug via INTRAVENOUS
  Administered 2022-03-12: 50 ug via INTRAVENOUS

## 2022-03-12 MED ORDER — ACETAMINOPHEN 325 MG PO TABS: 650.0000 mg | ORAL_TABLET | ORAL | Status: DC | PRN

## 2022-03-12 MED ORDER — LACTATED RINGERS IV SOLN: INTRAVENOUS | Status: DC | PRN

## 2022-03-12 MED ORDER — SODIUM CHLORIDE 0.9 % IV SOLN
0.1500 ug/kg/min | INTRAVENOUS | Status: AC
  Administered 2022-03-12: .1 ug/kg/min via INTRAVENOUS
  Filled 2022-03-12: qty 2000

## 2022-03-12 MED ORDER — DOCUSATE SODIUM 100 MG PO CAPS
100.0000 mg | ORAL_CAPSULE | Freq: Two times a day (BID) | ORAL | Status: DC
  Administered 2022-03-12 – 2022-03-14 (×4): 100 mg via ORAL
  Filled 2022-03-12 (×4): qty 1

## 2022-03-12 MED ORDER — MIDAZOLAM HCL 2 MG/2ML IJ SOLN
INTRAMUSCULAR | Status: AC
  Filled 2022-03-12: qty 2

## 2022-03-12 MED ORDER — PHENYLEPHRINE 80 MCG/ML (10ML) SYRINGE FOR IV PUSH (FOR BLOOD PRESSURE SUPPORT)
PREFILLED_SYRINGE | INTRAVENOUS | Status: DC | PRN
  Administered 2022-03-12 (×2): 80 ug via INTRAVENOUS
  Administered 2022-03-12: 160 ug via INTRAVENOUS
  Administered 2022-03-12 (×2): 80 ug via INTRAVENOUS

## 2022-03-12 MED ORDER — LIDOCAINE 2% (20 MG/ML) 5 ML SYRINGE
INTRAMUSCULAR | Status: DC | PRN
  Administered 2022-03-12: 60 mg via INTRAVENOUS

## 2022-03-12 MED ORDER — ROCURONIUM BROMIDE 10 MG/ML (PF) SYRINGE
PREFILLED_SYRINGE | INTRAVENOUS | Status: DC | PRN
  Administered 2022-03-12 (×3): 20 mg via INTRAVENOUS
  Administered 2022-03-12: 80 mg via INTRAVENOUS

## 2022-03-12 MED ORDER — LIDOCAINE-EPINEPHRINE 1 %-1:100000 IJ SOLN
INTRAMUSCULAR | Status: AC
  Filled 2022-03-12: qty 1

## 2022-03-12 MED ORDER — ACETAMINOPHEN 10 MG/ML IV SOLN
INTRAVENOUS | Status: AC
  Filled 2022-03-12: qty 100

## 2022-03-12 MED ORDER — CLEVIDIPINE BUTYRATE 0.5 MG/ML IV EMUL
0.0000 mg/h | INTRAVENOUS | Status: DC
  Administered 2022-03-12: 2 mg/h via INTRAVENOUS
  Filled 2022-03-12: qty 50

## 2022-03-12 MED ORDER — THROMBIN 5000 UNITS EX SOLR
CUTANEOUS | Status: AC
  Filled 2022-03-12: qty 5000

## 2022-03-12 MED ORDER — ONDANSETRON HCL 4 MG/2ML IJ SOLN
INTRAMUSCULAR | Status: AC
  Filled 2022-03-12: qty 2

## 2022-03-12 MED ORDER — POLYETHYLENE GLYCOL 3350 17 G PO PACK: 17.0000 g | PACK | Freq: Every day | ORAL | Status: DC | PRN

## 2022-03-12 MED ORDER — LACTATED RINGERS IV SOLN: INTRAVENOUS | Status: DC

## 2022-03-12 MED ORDER — SUGAMMADEX SODIUM 200 MG/2ML IV SOLN
INTRAVENOUS | Status: DC | PRN
  Administered 2022-03-12: 200 mg via INTRAVENOUS

## 2022-03-12 MED ORDER — THROMBIN 5000 UNITS EX SOLR: OROMUCOSAL | Status: DC | PRN

## 2022-03-12 MED ORDER — ALBUMIN HUMAN 5 % IV SOLN: INTRAVENOUS | Status: DC | PRN

## 2022-03-12 MED ORDER — ACETAMINOPHEN 10 MG/ML IV SOLN
1000.0000 mg | Freq: Once | INTRAVENOUS | Status: DC | PRN
  Administered 2022-03-12: 1000 mg via INTRAVENOUS

## 2022-03-12 MED ORDER — FENTANYL CITRATE (PF) 100 MCG/2ML IJ SOLN
INTRAMUSCULAR | Status: AC
  Filled 2022-03-12: qty 2

## 2022-03-12 MED ORDER — MIDAZOLAM HCL 2 MG/2ML IJ SOLN
0.5000 mg | INTRAMUSCULAR | Status: AC | PRN
  Administered 2022-03-12 (×2): 0.5 mg via INTRAVENOUS

## 2022-03-12 MED ORDER — HYDROCORTISONE 20 MG PO TABS: 20.0000 mg | ORAL_TABLET | Freq: Every day | ORAL | Status: DC

## 2022-03-12 MED ORDER — THROMBIN 20000 UNITS EX SOLR
CUTANEOUS | Status: AC
  Filled 2022-03-12: qty 20000

## 2022-03-12 MED ORDER — CEFAZOLIN SODIUM-DEXTROSE 2-4 GM/100ML-% IV SOLN
2.0000 g | INTRAVENOUS | Status: AC
  Administered 2022-03-12: 2 g via INTRAVENOUS
  Filled 2022-03-12: qty 100

## 2022-03-12 MED ORDER — ACETAMINOPHEN 500 MG PO TABS: 1000.0000 mg | ORAL_TABLET | Freq: Once | ORAL | Status: DC | PRN

## 2022-03-12 MED ORDER — SALINE SPRAY 0.65 % NA SOLN
2.0000 | NASAL | Status: DC
  Administered 2022-03-12 – 2022-03-14 (×8): 2 via NASAL
  Filled 2022-03-12 (×2): qty 44

## 2022-03-12 MED ORDER — ENOXAPARIN SODIUM 40 MG/0.4ML IJ SOSY
40.0000 mg | PREFILLED_SYRINGE | INTRAMUSCULAR | Status: DC
  Administered 2022-03-13: 40 mg via SUBCUTANEOUS
  Filled 2022-03-12: qty 0.4

## 2022-03-12 MED ORDER — PROPOFOL 10 MG/ML IV BOLUS
INTRAVENOUS | Status: AC
  Filled 2022-03-12: qty 20

## 2022-03-12 MED ORDER — EPINEPHRINE PF 1 MG/ML IJ SOLN
INTRAMUSCULAR | Status: AC
  Filled 2022-03-12: qty 1

## 2022-03-12 MED ORDER — LIDOCAINE-EPINEPHRINE 1 %-1:100000 IJ SOLN
INTRAMUSCULAR | Status: DC | PRN
  Administered 2022-03-12: 10 mL via INTRADERMAL

## 2022-03-12 MED ORDER — TRIAMCINOLONE ACETONIDE 40 MG/ML IJ SUSP
INTRAMUSCULAR | Status: AC
  Filled 2022-03-12: qty 5

## 2022-03-12 MED ORDER — EPINEPHRINE HCL (NASAL) 0.1 % NA SOLN
NASAL | Status: DC | PRN
  Administered 2022-03-12: 30 mL via NASAL

## 2022-03-12 MED ORDER — DEXAMETHASONE SODIUM PHOSPHATE 10 MG/ML IJ SOLN
INTRAMUSCULAR | Status: AC
  Filled 2022-03-12: qty 1

## 2022-03-12 MED ORDER — SODIUM CHLORIDE 0.9 % IR SOLN
Status: DC | PRN
  Administered 2022-03-12 (×2): 1000 mL

## 2022-03-12 MED ORDER — MORPHINE SULFATE (PF) 2 MG/ML IV SOLN
INTRAVENOUS | Status: AC
  Filled 2022-03-12: qty 1

## 2022-03-12 MED ORDER — LABETALOL HCL 5 MG/ML IV SOLN
10.0000 mg | INTRAVENOUS | Status: DC | PRN
  Administered 2022-03-13 – 2022-03-14 (×7): 10 mg via INTRAVENOUS
  Filled 2022-03-12 (×7): qty 4

## 2022-03-12 MED ORDER — ONDANSETRON HCL 4 MG/2ML IJ SOLN
INTRAMUSCULAR | Status: DC | PRN
  Administered 2022-03-12: 4 mg via INTRAVENOUS

## 2022-03-12 MED ORDER — EPINEPHRINE HCL (NASAL) 0.1 % NA SOLN
NASAL | Status: AC
  Filled 2022-03-12: qty 60

## 2022-03-12 MED ORDER — ROCURONIUM BROMIDE 10 MG/ML (PF) SYRINGE
PREFILLED_SYRINGE | INTRAVENOUS | Status: AC
  Filled 2022-03-12: qty 20

## 2022-03-12 MED ORDER — LIDOCAINE 2% (20 MG/ML) 5 ML SYRINGE
INTRAMUSCULAR | Status: AC
  Filled 2022-03-12: qty 5

## 2022-03-12 MED ORDER — ENALAPRILAT 1.25 MG/ML IV SOLN: 1.2500 mg | Freq: Four times a day (QID) | INTRAVENOUS | Status: DC | PRN

## 2022-03-12 MED ORDER — OXYMETAZOLINE HCL 0.05 % NA SOLN
NASAL | Status: AC
  Filled 2022-03-12: qty 30

## 2022-03-12 MED ORDER — TRAZODONE HCL 50 MG PO TABS
25.0000 mg | ORAL_TABLET | Freq: Every day | ORAL | Status: DC
  Administered 2022-03-12 – 2022-03-13 (×2): 25 mg via ORAL
  Filled 2022-03-12 (×2): qty 1

## 2022-03-12 MED ORDER — HYDROCODONE-ACETAMINOPHEN 5-325 MG PO TABS
1.0000 | ORAL_TABLET | ORAL | Status: DC | PRN
  Administered 2022-03-12 (×2): 1 via ORAL
  Filled 2022-03-12: qty 1

## 2022-03-12 MED ORDER — CHLORHEXIDINE GLUCONATE CLOTH 2 % EX PADS
6.0000 | MEDICATED_PAD | Freq: Every day | CUTANEOUS | Status: DC
  Administered 2022-03-12 – 2022-03-14 (×3): 6 via TOPICAL

## 2022-03-12 MED ORDER — NICARDIPINE HCL IN NACL 20-0.86 MG/200ML-% IV SOLN
INTRAVENOUS | Status: AC
  Filled 2022-03-12: qty 200

## 2022-03-12 MED ORDER — SODIUM CHLORIDE 0.9 % IV SOLN
0.1500 ug/kg/min | INTRAVENOUS | Status: AC
  Administered 2022-03-12: .15 ug/kg/min via INTRAVENOUS
  Filled 2022-03-12: qty 2000

## 2022-03-12 MED ORDER — PROMETHAZINE HCL 25 MG PO TABS: 12.5000 mg | ORAL_TABLET | ORAL | Status: DC | PRN

## 2022-03-12 MED ORDER — CHLORHEXIDINE GLUCONATE 0.12 % MT SOLN
15.0000 mL | Freq: Once | OROMUCOSAL | Status: AC
  Administered 2022-03-12: 15 mL via OROMUCOSAL
  Filled 2022-03-12: qty 15

## 2022-03-12 MED ORDER — MUPIROCIN CALCIUM 2 % EX CREA
TOPICAL_CREAM | CUTANEOUS | Status: AC
  Filled 2022-03-12: qty 15

## 2022-03-12 MED ORDER — PHENYLEPHRINE 80 MCG/ML (10ML) SYRINGE FOR IV PUSH (FOR BLOOD PRESSURE SUPPORT)
PREFILLED_SYRINGE | INTRAVENOUS | Status: AC
  Filled 2022-03-12: qty 10

## 2022-03-12 MED ORDER — ONDANSETRON HCL 4 MG/2ML IJ SOLN
4.0000 mg | INTRAMUSCULAR | Status: DC | PRN
  Administered 2022-03-13: 4 mg via INTRAVENOUS
  Filled 2022-03-12: qty 2

## 2022-03-12 MED ORDER — NICARDIPINE HCL IN NACL 20-0.86 MG/200ML-% IV SOLN
3.0000 mg/h | INTRAVENOUS | Status: DC
  Administered 2022-03-12: 7 mg/h via INTRAVENOUS
  Administered 2022-03-12: 5 mg/h via INTRAVENOUS
  Administered 2022-03-13: 3 mg/h via INTRAVENOUS
  Filled 2022-03-12 (×2): qty 200

## 2022-03-12 MED ORDER — CEFAZOLIN SODIUM-DEXTROSE 2-4 GM/100ML-% IV SOLN: 2.0000 g | INTRAVENOUS | Status: DC

## 2022-03-12 MED ORDER — ACETAMINOPHEN 650 MG RE SUPP: 650.0000 mg | RECTAL | Status: DC | PRN

## 2022-03-12 MED ORDER — CEFAZOLIN SODIUM-DEXTROSE 1-4 GM/50ML-% IV SOLN
1.0000 g | Freq: Three times a day (TID) | INTRAVENOUS | Status: AC
  Administered 2022-03-12 – 2022-03-13 (×3): 1 g via INTRAVENOUS
  Filled 2022-03-12 (×3): qty 50

## 2022-03-12 MED ORDER — PHENYLEPHRINE HCL-NACL 20-0.9 MG/250ML-% IV SOLN
INTRAVENOUS | Status: DC | PRN
  Administered 2022-03-12: 25 ug/min via INTRAVENOUS

## 2022-03-12 MED ORDER — HYDROCODONE-ACETAMINOPHEN 5-325 MG PO TABS
ORAL_TABLET | ORAL | Status: AC
  Filled 2022-03-12: qty 1

## 2022-03-12 MED ORDER — PROPOFOL 10 MG/ML IV BOLUS
INTRAVENOUS | Status: DC | PRN
  Administered 2022-03-12: 140 mg via INTRAVENOUS
  Administered 2022-03-12: 110 mg via INTRAVENOUS
  Administered 2022-03-12: 100 mg via INTRAVENOUS

## 2022-03-12 MED ORDER — DEXAMETHASONE SODIUM PHOSPHATE 10 MG/ML IJ SOLN
INTRAMUSCULAR | Status: DC | PRN
  Administered 2022-03-12: 5 mg via INTRAVENOUS

## 2022-03-12 MED ORDER — ONDANSETRON HCL 4 MG PO TABS: 4.0000 mg | ORAL_TABLET | ORAL | Status: DC | PRN

## 2022-03-12 MED ORDER — HYDROCORTISONE SOD SUC (PF) 100 MG IJ SOLR
50.0000 mg | Freq: Three times a day (TID) | INTRAMUSCULAR | Status: AC
  Administered 2022-03-12 – 2022-03-14 (×5): 50 mg via INTRAVENOUS
  Filled 2022-03-12 (×5): qty 2

## 2022-03-12 MED ORDER — HYDROCORTISONE 10 MG PO TABS: 10.0000 mg | ORAL_TABLET | Freq: Every evening | ORAL | Status: DC

## 2022-03-12 MED ORDER — CLEVIDIPINE BUTYRATE 0.5 MG/ML IV EMUL
INTRAVENOUS | Status: AC
  Filled 2022-03-12: qty 50

## 2022-03-12 MED ORDER — OXYMETAZOLINE HCL 0.05 % NA SOLN: 1.0000 | Freq: Two times a day (BID) | NASAL | Status: DC | PRN

## 2022-03-12 MED ORDER — EPINEPHRINE HCL (NASAL) 0.1 % NA SOLN
NASAL | Status: AC
  Filled 2022-03-12: qty 30

## 2022-03-12 SURGICAL SUPPLY — 130 items
ANTIFOG SOL W/FOAM PAD STRL (MISCELLANEOUS) ×2
APL SKNCLS STERI-STRIP NONHPOA (GAUZE/BANDAGES/DRESSINGS) ×2
ATTRACTOMAT 16X20 MAGNETIC DRP (DRAPES) ×3 IMPLANT
BAG COUNTER SPONGE SURGICOUNT (BAG) ×7 IMPLANT
BAG SPNG CNTER NS LX DISP (BAG) ×6
BAND INSRT 18 STRL LF DISP RB (MISCELLANEOUS) ×4
BAND RUBBER #18 3X1/16 STRL (MISCELLANEOUS) ×6 IMPLANT
BENZOIN TINCTURE PRP APPL 2/3 (GAUZE/BANDAGES/DRESSINGS) ×3 IMPLANT
BLADE CLIPPER SURG (BLADE) IMPLANT
BLADE RAD40 ROTATE 4M 4 5PK (BLADE) IMPLANT
BLADE ROTATE TRICUT 4X13 M4 (BLADE) ×3 IMPLANT
BLADE SURG 10 STRL SS (BLADE) ×3 IMPLANT
BLADE SURG 11 STRL SS (BLADE) ×6 IMPLANT
BLADE SURG 15 STRL LF DISP TIS (BLADE) ×6 IMPLANT
BLADE SURG 15 STRL SS (BLADE) ×4
BUR DIAMOND 13X5 70D (BURR) IMPLANT
BUR DIAMOND CURV 15X5 15D (BURR) IMPLANT
BUR TAPER CHOANAL ATRESIA 30K (BURR) ×1 IMPLANT
CABLE BIPOLOR RESECTION CORD (MISCELLANEOUS) ×3 IMPLANT
CANISTER SUCT 3000ML PPV (MISCELLANEOUS) ×10 IMPLANT
COAGULATOR SUCT SWTCH 10FR 6 (ELECTROSURGICAL) IMPLANT
COVER BACK TABLE 60X90IN (DRAPES) IMPLANT
COVER MAYO STAND STRL (DRAPES) ×3 IMPLANT
DEFOGGER MIRROR 1QT (MISCELLANEOUS) ×3 IMPLANT
DRAIN SUBARACHNOID (WOUND CARE) ×3 IMPLANT
DRAPE C-ARM 42X72 X-RAY (DRAPES) ×6 IMPLANT
DRAPE HALF SHEET 40X57 (DRAPES) ×6 IMPLANT
DRAPE INCISE IOBAN 66X45 STRL (DRAPES) ×3 IMPLANT
DRAPE LAPAROTOMY 100X72X124 (DRAPES) ×3 IMPLANT
DRAPE MICROSCOPE SLANT 54X150 (MISCELLANEOUS) IMPLANT
DRAPE SURG 17X23 STRL (DRAPES) ×10 IMPLANT
DRESSING NASAL POPE 10X1.5X2.5 (GAUZE/BANDAGES/DRESSINGS) IMPLANT
DRSG NASAL POPE 10X1.5X2.5 (GAUZE/BANDAGES/DRESSINGS)
DRSG OPSITE POSTOP 3X4 (GAUZE/BANDAGES/DRESSINGS) ×3 IMPLANT
DURAPREP 26ML APPLICATOR (WOUND CARE) ×4 IMPLANT
ELECT COATED BLADE 2.86 ST (ELECTRODE) IMPLANT
ELECT EZ MEGADYNE CLEAN NT 3X6 (ELECTRODE) ×1 IMPLANT
ELECT NDL TIP 2.8 STRL (NEEDLE) ×2 IMPLANT
ELECT NEEDLE TIP 2.8 STRL (NEEDLE) ×2 IMPLANT
ELECT REM PT RETURN 9FT ADLT (ELECTROSURGICAL) ×6
ELECTRODE NDL INSULATED 6.5 (ELECTROSURGICAL) IMPLANT
ELECTRODE REM PT RTRN 9FT ADLT (ELECTROSURGICAL) ×7 IMPLANT
GAUZE PACKING FOLDED 2  STR (GAUZE/BANDAGES/DRESSINGS) ×2
GAUZE PACKING FOLDED 2 STR (GAUZE/BANDAGES/DRESSINGS) ×3 IMPLANT
GAUZE SPONGE 2X2 8PLY STRL LF (GAUZE/BANDAGES/DRESSINGS) ×3 IMPLANT
GAUZE SPONGE 4X4 12PLY STRL (GAUZE/BANDAGES/DRESSINGS) ×3 IMPLANT
GLOVE BIO SURGEON STRL SZ 6.5 (GLOVE) ×4 IMPLANT
GLOVE BIO SURGEON STRL SZ7.5 (GLOVE) IMPLANT
GLOVE BIOGEL PI IND STRL 7.0 (GLOVE) ×1 IMPLANT
GLOVE BIOGEL PI IND STRL 7.5 (GLOVE) ×8 IMPLANT
GLOVE ECLIPSE 7.0 STRL STRAW (GLOVE) ×3 IMPLANT
GLOVE ECLIPSE 7.5 STRL STRAW (GLOVE) ×3 IMPLANT
GLOVE EXAM NITRILE XL STR (GLOVE) IMPLANT
GLOVE SURG ENC MOIS LTX SZ8 (GLOVE) ×4 IMPLANT
GOWN STRL REUS W/ TWL LRG LVL3 (GOWN DISPOSABLE) ×8 IMPLANT
GOWN STRL REUS W/ TWL XL LVL3 (GOWN DISPOSABLE) ×4 IMPLANT
GOWN STRL REUS W/TWL 2XL LVL3 (GOWN DISPOSABLE) IMPLANT
GOWN STRL REUS W/TWL LRG LVL3 (GOWN DISPOSABLE) ×8
GOWN STRL REUS W/TWL XL LVL3 (GOWN DISPOSABLE) ×4
GRAFT DURAGEN MATRIX 1WX1L (Tissue) ×1 IMPLANT
HEMOSTAT ARISTA ABSORB 3G PWDR (HEMOSTASIS) ×1 IMPLANT
HEMOSTAT POWDER KIT SURGIFOAM (HEMOSTASIS) ×3 IMPLANT
HEMOSTAT SURGICEL 2X14 (HEMOSTASIS) IMPLANT
IV NS 1000ML (IV SOLUTION) ×2
IV NS 1000ML BAXH (IV SOLUTION) ×1 IMPLANT
KIT BASIN OR (CUSTOM PROCEDURE TRAY) ×7 IMPLANT
KIT DRAIN CSF ACCUDRAIN (MISCELLANEOUS) IMPLANT
KIT TURNOVER KIT B (KITS) ×7 IMPLANT
KNIFE ARACHNOID DISP AM-21-S (BLADE) IMPLANT
NDL HYPO 25GX1X1/2 BEV (NEEDLE) ×4 IMPLANT
NDL HYPO 25X1 1.5 SAFETY (NEEDLE) ×2 IMPLANT
NDL SPNL 18GX3.5 QUINCKE PK (NEEDLE) IMPLANT
NDL SPNL 22GX3.5 QUINCKE BK (NEEDLE) ×2 IMPLANT
NDL SPNL 25GX3.5 QUINCKE BL (NEEDLE) ×2 IMPLANT
NEEDLE HYPO 22GX1.5 SAFETY (NEEDLE) ×3 IMPLANT
NEEDLE HYPO 25GX1X1/2 BEV (NEEDLE) ×4 IMPLANT
NEEDLE HYPO 25X1 1.5 SAFETY (NEEDLE) ×2 IMPLANT
NEEDLE SPNL 18GX3.5 QUINCKE PK (NEEDLE) IMPLANT
NEEDLE SPNL 22GX3.5 QUINCKE BK (NEEDLE) ×2 IMPLANT
NEEDLE SPNL 25GX3.5 QUINCKE BL (NEEDLE) ×2 IMPLANT
NS IRRIG 1000ML POUR BTL (IV SOLUTION) ×7 IMPLANT
PACK LAMINECTOMY NEURO (CUSTOM PROCEDURE TRAY) ×3 IMPLANT
PAD ARMBOARD 7.5X6 YLW CONV (MISCELLANEOUS) ×12 IMPLANT
PATTIES SURGICAL .25X.25 (GAUZE/BANDAGES/DRESSINGS) IMPLANT
PATTIES SURGICAL .5 X.5 (GAUZE/BANDAGES/DRESSINGS) IMPLANT
PATTIES SURGICAL .5 X3 (DISPOSABLE) ×6 IMPLANT
PENCIL BUTTON HOLSTER BLD 10FT (ELECTRODE) ×3 IMPLANT
SEALANT ADHERUS EXTEND TIP (MISCELLANEOUS) ×1 IMPLANT
SHEATH ENDOSCRUB 0 DEG (SHEATH) ×3 IMPLANT
SHEATH ENDOSCRUB 30 DEG (SHEATH) IMPLANT
SHEATH ENDOSCRUB 45 DEG (SHEATH) IMPLANT
SOL ANTI FOG 6CC (MISCELLANEOUS) ×3 IMPLANT
SOLUTION ANTFG W/FOAM PAD STRL (MISCELLANEOUS) ×1 IMPLANT
SPECIMEN JAR SMALL (MISCELLANEOUS) IMPLANT
SPIKE FLUID TRANSFER (MISCELLANEOUS) ×3 IMPLANT
SPLINT NASAL DOYLE BI-VL (GAUZE/BANDAGES/DRESSINGS) IMPLANT
SPLINT NASAL POSISEP X .6X2 (GAUZE/BANDAGES/DRESSINGS) ×1 IMPLANT
SPLINT NASAL POSISEP X2 .8X2.3 (GAUZE/BANDAGES/DRESSINGS) IMPLANT
SPONGE SURGIFOAM ABS GEL 100 (HEMOSTASIS) ×3 IMPLANT
SPONGE T-LAP 4X18 ~~LOC~~+RFID (SPONGE) ×3 IMPLANT
STAPLER SKIN PROX WIDE 3.9 (STAPLE) ×3 IMPLANT
STRIP CLOSURE SKIN 1/2X4 (GAUZE/BANDAGES/DRESSINGS) ×3 IMPLANT
SUCTION FRAZIER HANDLE 10FR (MISCELLANEOUS) ×2
SUCTION TUBE FRAZIER 10FR DISP (MISCELLANEOUS) ×1 IMPLANT
SUT 5.0 PDS RB-1 (SUTURE)
SUT BONE WAX W31G (SUTURE) ×3 IMPLANT
SUT ETHILON 3 0 FSL (SUTURE) IMPLANT
SUT ETHILON 3 0 PS 1 (SUTURE) IMPLANT
SUT ETHILON 6 0 P 1 (SUTURE) IMPLANT
SUT MNCRL AB 4-0 PS2 18 (SUTURE) ×3 IMPLANT
SUT PDS AB 4-0 RB1 27 (SUTURE) IMPLANT
SUT PDS PLUS AB 5-0 RB-1 (SUTURE) IMPLANT
SUT PLAIN 4 0 ~~LOC~~ 1 (SUTURE) IMPLANT
SUT VIC AB 2-0 CP2 18 (SUTURE) ×3 IMPLANT
SUT VIC AB 4-0 P-3 18X BRD (SUTURE) IMPLANT
SUT VIC AB 4-0 P3 18 (SUTURE)
SYR CONTROL 10ML LL (SYRINGE) ×3 IMPLANT
SYR TB 1ML LUER SLIP (SYRINGE) ×2 IMPLANT
TOWEL GREEN STERILE (TOWEL DISPOSABLE) ×4 IMPLANT
TOWEL GREEN STERILE FF (TOWEL DISPOSABLE) ×7 IMPLANT
TRACKER ENT INSTRUMENT (MISCELLANEOUS) ×6 IMPLANT
TRACKER ENT PATIENT (MISCELLANEOUS) ×3 IMPLANT
TRAP SPECIMEN MUCUS 40CC (MISCELLANEOUS) IMPLANT
TRAY ENT MC OR (CUSTOM PROCEDURE TRAY) ×6 IMPLANT
TRAY FOLEY MTR SLVR 16FR STAT (SET/KITS/TRAYS/PACK) ×3 IMPLANT
TUBE CONNECTING 12X1/4 (SUCTIONS) ×3 IMPLANT
TUBING EXTENTION W/L.L. (IV SETS) ×1 IMPLANT
TUBING FEATHERFLOW (TUBING) IMPLANT
TUBING STRAIGHTSHOT EPS 5PK (TUBING) ×3 IMPLANT
WATER STERILE IRR 1000ML POUR (IV SOLUTION) ×4 IMPLANT

## 2022-03-12 NOTE — Op Note (Signed)
Procedure(s): ENDOSCOPIC ENDONASAL TRANSPHENOIDAL RESECTION OF PITUITARY MASS TRANSPHENOIDAL ENDOSCOPIC RESCTION OF PITUITARY ADENOMA Procedure Note  Bobby York male 67 y.o. 03/12/2022  Procedure(s) and Anesthesia Type: Panel 1:    * ENDOSCOPIC ENDONASAL TRANSPHENOIDAL RESECTION OF PITUITARY MASS - General  Panel 2:    * TRANSPHENOIDAL ENDOSCOPIC RESCTION OF PITUITARY ADENOMA - General  Surgeon(s) and Role: Panel 1:    Vallarie Mare, MD - Primary Panel 2:    * Skotnicki, Meghan A, DO - Primary   Indications:   This is a 67 yo M who was found to have a large pituitary mass seen on workup for headaches.  Serial imaging demonstrated significant growth of the mass with suprasellar extension and contact though no impingement of the optic apparatus.  Pituitary panel showed this was nonfunctioning.  I had a long discussion with the family and the patient.  Given the significant growth and contact of the optic structures, I recommended transsphenoidal pituitary resection.  I discussed the technique of transsphenoidal resection, as well as risk, benefits, alternatives, expected convalescence in detail.  Risk discussed included, but were not limited to Bleeding, pain, infection, scar, spinal fluid leak, hypopituitarism, stroke, and death.  Informed consent was obtained.   Surgeon: Vallarie Mare    Co-surgeon: Ebbie Latus, DO   Anesthesia: General endotracheal anesthesia   Procedure Detail   ENDOSCOPIC ENDONASAL TRANSPHENOIDAL RESECTION OF PITUITARY TUMOR   The patient was brought to the operating room.  General anesthesia was induced and patient was intubated by the anesthesia service.  Appropriate lines and monitors were placed.  Patient was positioned supine and preoperative thin cut CT was reconstructed into 3D image and used to perform surface match registration with the [employer] navigation system.  This allowed for navigation throughout the case.  The abdomen and  face was preprepped and prepped and draped in sterile fashion.  The endoscopic approach to the sphenoid sinus and sphenoidotomy was performed by Dr. Isaias Cowman.  Please see her op note for details.  The sellar floor was eroded.  Additional sellar floor was removed laterally to the medial aspect of the cavernous sinus and superiorly to the tuberculum.  Neuronavigation was used to help with orientation and ensure appropriate opening of the sellar floor.  Small amount of remaining dura was cut sharply and coagulated.  Yellow-gray tumor was removed with combination of ring curettes and pituitary rongeurs and suction.  Specimen was collected for pathology.  The posterior aspect of the tumor was removed and the partially eroded posterior sella was seen.  Tumor was removed laterally to the wall of the cavernous sinus.  Superiorly, tumor was removed with the use of angled ring curettes.  The diaphragma was identified in the posterior superior aspect of the tumor.  Angled ring curettes were used to remove additional suprasellar tumor anteriorly.  Valsalva maneuver was performed and no other suprasellar tumor appeared to be remaining.  Redundancy of the diaphragma was noted.  Meticulous hemostasis was obtained with Floseal.  There was no evidence of CSF leak on Valsalva. Duragen inlay was placed for duraplasty and secured with dural spray.  The turbinates were then medialized and the patient was extubated by the anesthesia service.  All counts were correct at the end of surgery.  No complications were noted.   Findings: Large pituitary mass subtotally resected   Estimated Blood Loss:  less than 100 mL         Drains: None  Total IV Fluids: None   Blood Given: none          Specimens: pituitary tumor         Implants: none        Complications:  * No complications entered in OR log *         Disposition: PACU - hemodynamically stable.         Condition: stable

## 2022-03-12 NOTE — Discharge Instructions (Signed)
[facility] SINUS SURGERY (FESS) Post Operative Instructions  Office: [phone]  The Surgery Itself Endoscopic sinus surgery (with or without septoplasty and turbinate reduction) involves general anesthesia, typically for one to two hours. Patients may be sedated for several hours after surgery and may remain sleepy for the better part of the day. Nausea and vomiting are occasionally seen, and usually resolve by the evening of surgery - even without additional medications. Almost all patients can go home the day of surgery.  After Surgery  Facial pressure and fullness similar to a sinus infection/headache is normal after surgery. Breathing through your nose is also difficult due to swelling. A humidifier or vaporizer can be used in the bedroom to prevent throat pain with mouth breathing.   Bloody nasal drainage is normal after this surgery for 5-7 days, usually decreasing in volume with each day that passes. Drainage will flow from the front of the nose and down the back of the throat. Make sure you spit out blood drainage that drips down the back of your throat to prevent nausea/vomiting. You will have a nasal drip pad/sling with gauze to catch drainage from the front of your nose. The dressing may need to be changed frequently during the first 24 hours following surgery. In case of profuse nasal bleeding, you may apply ice to the bridge of the nose and pinch the nose just above the tip and hold for 10 minutes; if bleeding continues, contact the doctors office.   Frequent hot showers or saline nasal rinses (NeilMed) will help break up congestion and clear any clot or mucus that builds up within the nose after surgery. This can be started the day after surgery.   It is more comfortable to sleep with extra pillows or in a recliner for the first few days after surgery until the drainage begins to resolve.    Do not blow your nose for 2 weeks after surgery.   Avoid lifting > 10 lbs. and no  vigorous exercise for 2 weeks after Surgery.   Avoid airplane travel for 2 weeks following sinus surgery; the cabin pressure changes can cause pain and swelling within the nose/sinuses.   Sense of smell and taste are often diminished for several weeks after surgery. There may be some tenderness or numbness in your upper front teeth, which is normal after surgery. You may express old clot, discolored mucus or very large nasal crusts from your nose for up to 3-4 weeks after surgery; depending on how frequently and how effectively you irrigate your nose with the saltwater spray.   You may have absorbable sutures inside of your nose after surgery that will slowly dissolve in 2-3 weeks. Be careful when clearing crusts from the nose since they may be attached to these sutures.  Medications  Pain medication can be used for pain as prescribed. Pain and pressure in the nose is expected after surgery. As the surgical site heals, pain will resolve over the course of a week. Pain medications can cause nausea, which can be prevented if you take them with food or milk.   You may be given an antibiotic for one week after surgery to prevent infection. Take this medication with food to prevent nausea or vomiting.   You can use 2 nasal sprays after surgery: Afrin can be used up to 2 times a day for up to 5 days after surgery (best before bed) to reduce bloody drainage from the nose for the first few days after surgery. Saline/salt  water spray can be used as often as you would like starting the day after surgery to prevent crusting inside of the nose.   Take all of your routine medications as prescribed, unless told otherwise by your surgeon. Any medications that thin the blood should be avoided. This includes aspirin. Avoid aspirin-like products for the first 72 hours after surgery (Advil, Motrin, Excedrin, Alieve, Celebrex, Naprosyn), but you may use them as needed for pain after 72 hours.

## 2022-03-12 NOTE — Op Note (Signed)
OPERATIVE NOTE  Bobby York Date/Time of Admission: 03/12/2022  5:14 AM  CSN: [id number];OEV:[id number] Attending Provider: Vallarie Mare, MD Room/Bed: MCPO/NONE DOB: 19-Oct-1955 Age: 67 y.o.   Pre-Op Diagnosis: PITUITARY ADENOMA  Post-Op Diagnosis: PITUITARY ADENOMA  Procedure: Procedure(s): ENDOSCOPIC ENDONASAL TRANSPHENOIDAL RESECTION OF PITUITARY MASS WITH IMAGE GUIDANCE  Anesthesia: General  Surgeon(s): Nahmir Zeidman A Niall Illes, DO Duffy Rhody, MD  Staff: Circulator: Arnoldo Morale, RN; Zannie Kehr, RN; Leda Gauze, RN; Darylene Price, RN Scrub Person: Gladstone Pih, Jim Like Vendor Representative : Drema Balzarine  Implants: Implant Name Type Inv. Item Serial No. Manufacturer Lot No. LRB No. Used Action  GRAFT DURAGEN MATRIX [id number] - [id number] Tissue GRAFT DURAGEN MATRIX [id number]  [employer] [id number] N/A 1 Implanted    Specimens: ID Type Source Tests Collected by Time Destination  1 : Pituitary Mass Tissue PATH Other SURGICAL PATHOLOGY Vallarie Mare, MD 1/75/1025 8527     Complications: None  EBL: 50 ML  Condition: stable  Operative Findings:  Midline septum with evidence of chronic sinusitis in the left sphenoid sinus with copious mucoid drainage. Pituitary mass lesion with no evidence of CSF leak following resection  Description of Operation: Once operative consent was obtained and the site and surgery were confirmed with the patient and the operating room team, the patient was brought back to the operating room and general endotracheal anesthesia was obtained. The patient was then turned over to the ENT service, at which time the image-guided system was attached and noted to be in good calibration. Lidocaine 1% with 1:100,000 epinephrine was injected into the nasal septum bilaterally, inferior turbinates bilaterally, the middle turbinates bilaterally, and the axilla between the medial turbinate and the lateral nasal  wall. Afrin-soaked pledgets were placed into the nasal cavity, and the patient was prepped and draped in sterile fashion. Attention was first turned to the right nasal vestibule.  The inferior and middle turbinate were gently lateralized using a Cottle elevator until the superior turbinate was identified.  This was also lateralized until the natural sinus ostium was appreciated.  This was serially dilated using a mushroom punch and an up-biting Kerrison.  A posterior septectomy was then performed using a combination of a Cottle elevator, Tru-Cut forceps and Kerrisons. The mucosa overlying the pedical for the nasoseptal flap was carefully freed from the underlying bone and preserved for possible future nasoseptal flap. The left middle turbinate and superior turbinate were then identified and gently lateralized until the natural sphenoid os was identified and serially dilated using the sphenoid dilator, mushroom punch and up-biting Kerrisons.  The mucosa overlying the pedical for the nasoseptal flap was carefully freed from the underlying bone and preserved for possible future nasoseptal flap. With the bilateral sphenoid cavities in view, the remaining bone of the sphenoid face was removed using Kerrison forceps.  A diamond bur drill was also used to complete the bony resection and to remove the intrasinus septum until the bone overlying the pituitary adenoma was completely visualized.  This bone was removed using Kerrison forceps until the tumor was completely visualized. Dr. Marcello Moores then scrubbed in and completely resected the tumor, please see his operative note for details regarding the repair.  There was no evidence of CSF leak following tumor removal. A segment of Duragen was cut to the appropriate size and used to cover the defect completely.  This was reinforced with gelfoam and Adheris was used to completely cover the flap and Surgicel. Arista and Posisep nasal packing  was placed in bilateral nasal cavities in  the sphenoid defect. An orogastric tube was placed and the stomach cavity was suctioned to reduce postoperative nausea. The patient was turned over to anesthesia service and was extubated in the operating room and transferred to the PACU in stable condition.   Jason Coop, [facility]  03/12/2022

## 2022-03-12 NOTE — Transfer of Care (Signed)
Immediate Anesthesia Transfer of Care Note  Patient: Bobby York  Procedure(s) Performed: ENDOSCOPIC ENDONASAL TRANSPHENOIDAL RESECTION OF PITUITARY MASS (Nose) TRANSPHENOIDAL ENDOSCOPIC RESCTION OF PITUITARY ADENOMA (Nose)  Patient Location: PACU  Anesthesia Type:General  Level of Consciousness: drowsy and patient cooperative  Airway & Oxygen Therapy: Patient Spontanous Breathing and Patient connected to face mask oxygen  Post-op Assessment: Report given to RN, Post -op Vital signs reviewed and stable, and Patient moving all extremities X 4  Post vital signs: Reviewed and stable  Last Vitals:  Vitals Value Taken Time  BP 153/100 03/12/22 1145  Temp    Pulse 90 03/12/22 1151  Resp 13 03/12/22 1151  SpO2 96 % 03/12/22 1151  Vitals shown include unvalidated device data.  Last Pain:  Vitals:   03/12/22 0621  TempSrc:   PainSc: 0-No pain         Complications: No notable events documented.

## 2022-03-12 NOTE — H&P (Signed)
CC: pituitary tumor  HPI:     Patient is a 67 y.o. male presents with an enlarging pituitary tumor with suprasellr extension.  He is here for elective TSR.    Patient Active Problem List   Diagnosis Date Noted   Pituitary mass ([place]) 03/12/2022   DOE (dyspnea on exertion) 08/27/2021   Pneumothorax, traumatic 04/14/2016   H. pylori infection 08/22/2014   Reflux esophagitis    Dysphagia, pharyngoesophageal phase    Mucosal abnormality of stomach    History of colonic polyps    Chronic hepatitis C ([place]) 07/09/2014   Abnormal weight loss 07/09/2014   History of ETOH abuse 07/09/2014   Esophageal dysphagia 07/09/2014   Odynophagia 07/09/2014   Abnormal ECG 05/28/2014   Past Medical History:  Diagnosis Date   Bipolar 1 disorder ([place])    diagnosed 2004   Dyspnea    Hepatitis    Hep C   Substance abuse ([place]) 2021    Past Surgical History:  Procedure Laterality Date   BACK SURGERY     two-lumbar disc x2   CLAVICLE SURGERY     COLONOSCOPY WITH PROPOFOL N/A 07/25/2014   IRS:WNIOEV rectal polyp otherwise normal . hyperplastic polyp. TCS in 07/2024   ESOPHAGEAL DILATION N/A 07/25/2014   Procedure: ESOPHAGEAL DILATION;  Surgeon: Daneil Dolin, MD;  Location: [facility];  Service: Endoscopy;  Laterality: N/A;  Malony dilators # 56,   ESOPHAGOGASTRODUODENOSCOPY (EGD) WITH PROPOFOL N/A 07/25/2014   OJJ:KKXF erosive reflux/s/p dilator/HH. +hpylor    NECK SURGERY      Medications Prior to Admission  Medication Sig Dispense Refill Last Dose   traZODone (DESYREL) 50 MG tablet Take 25 mg by mouth at bedtime.   Past Week   famotidine (PEPCID) 20 MG tablet One after supper (Patient not taking: Reported on 03/04/2022) 30 tablet 11 Not Taking   pantoprazole (PROTONIX) 40 MG tablet Take 1 tablet (40 mg total) by mouth daily. Take 30-60 min before first meal of the day (Patient not taking: Reported on 03/04/2022) 30 tablet 2 Not Taking   No Known Allergies  Social History   Tobacco Use   Smoking  status: Former    Packs/day: 0.40    Years: 40.00    Total pack years: 16.00    Types: Cigarettes    Start date: 05/28/1970    Quit date: 03/2021    Years since quitting: 0.9   Smokeless tobacco: Never   Tobacco comments:    Pt states he stopped smoking about 4-5 months ago // mr CMA 08/27/2021   Substance Use Topics   Alcohol use: Not Currently    Comment: AS of today, last use in 2021    Family History  Problem Relation Age of Onset   Heart disease Mother    Diabetes Mother    Prostate cancer Father    Liver disease Neg Hx    Colon cancer Neg Hx      Review of Systems Pertinent items are noted in HPI.  Objective:   Patient Vitals for the past 8 hrs:  BP Temp Temp src Pulse Resp SpO2 Height Weight  03/12/22 0539 (!) 144/93 98.8 F (37.1 C) Oral 74 17 98 % '5\' 11"'$  (1.803 m) 84.4 kg   No intake/output data recorded. No intake/output data recorded.      General : Alert, cooperative, no distress, appears stated age   Head:  Normocephalic/atraumatic    Eyes: PERRL, conjunctiva/corneas clear, EOM's intact. Fundi could not be visualized Neck: Supple  Chest:  Respirations unlabored Chest wall: no tenderness or deformity Heart: Regular rate and rhythm Abdomen: Soft, nontender and nondistended Extremities: warm and well-perfused Skin: normal turgor, color and texture Neurologic:  Alert, oriented x 3.  Eyes open spontaneously. PERRL, EOMI, VFC, no facial droop. V1-3 intact.  No dysarthria, tongue protrusion symmetric.  CNII-XII intact. Normal strength, sensation and reflexes throughout.  No pronator drift, full strength in legs       Data ReviewCBC:  Lab Results  Component Value Date   WBC 4.9 03/09/2022   RBC 4.30 03/09/2022   BMP:  Lab Results  Component Value Date   GLUCOSE 113 (H) 03/09/2022   CO2 25 03/09/2022   BUN 13 03/09/2022   CREATININE 1.18 03/09/2022   CREATININE 1.06 06/02/2015   CALCIUM 8.9 03/09/2022   Radiology review:  See clinic  note  Assessment:   Principal Problem:   Pituitary mass ([place])   Plan:   - TSR today - risks, benefits, alternatives, and convalescence discussed. Informed consent obtained

## 2022-03-12 NOTE — Anesthesia Procedure Notes (Signed)
Procedure Name: Intubation Date/Time: 03/12/2022 8:19 AM  Performed by: Colin Benton, CRNAPre-anesthesia Checklist: Patient identified, Emergency Drugs available, Suction available and Patient being monitored Patient Re-evaluated:Patient Re-evaluated prior to induction Oxygen Delivery Method: Circle system utilized Preoxygenation: Pre-oxygenation with 100% oxygen Induction Type: IV induction Ventilation: Mask ventilation without difficulty Laryngoscope Size: Miller and 2 Grade View: Grade I Tube type: Oral Tube size: 7.5 mm Number of attempts: 1 Airway Equipment and Method: Stylet Placement Confirmation: ETT inserted through vocal cords under direct vision, positive ETCO2 and breath sounds checked- equal and bilateral Secured at: 23 cm Tube secured with: Tape Dental Injury: Teeth and Oropharynx as per pre-operative assessment

## 2022-03-12 NOTE — Anesthesia Procedure Notes (Signed)
Arterial Line Insertion Start/End1/19/2024 7:05 AM, 03/12/2022 7:10 AM Performed by: Colin Benton, CRNA, CRNA  Patient location: Pre-op. Preanesthetic checklist: patient identified, IV checked, site marked, risks and benefits discussed, surgical consent, monitors and equipment checked, pre-op evaluation, timeout performed and anesthesia consent Lidocaine 1% used for infiltration Left, radial was placed Catheter size: 20 G Hand hygiene performed , maximum sterile barriers used  and Seldinger technique used Allen's test indicative of satisfactory collateral circulation Attempts: 1 Procedure performed without using ultrasound guided technique. Following insertion, dressing applied and Biopatch. Post procedure assessment: unchanged and normal  Patient tolerated the procedure well with no immediate complications.

## 2022-03-13 LAB — URINALYSIS, ROUTINE W REFLEX MICROSCOPIC
Bacteria, UA: NONE SEEN
Bilirubin Urine: NEGATIVE
Glucose, UA: NEGATIVE mg/dL
Ketones, ur: NEGATIVE mg/dL
Leukocytes,Ua: NEGATIVE
Nitrite: NEGATIVE
Protein, ur: NEGATIVE mg/dL
Specific Gravity, Urine: 1.012 (ref 1.005–1.030)
pH: 5 (ref 5.0–8.0)

## 2022-03-13 LAB — TSH: TSH: 0.409 u[IU]/mL (ref 0.350–4.500)

## 2022-03-13 LAB — BASIC METABOLIC PANEL
Anion gap: 8 (ref 5–15)
BUN: 11 mg/dL (ref 8–23)
CO2: 22 mmol/L (ref 22–32)
Calcium: 8.8 mg/dL — ABNORMAL LOW (ref 8.9–10.3)
Chloride: 108 mmol/L (ref 98–111)
Creatinine, Ser: 1 mg/dL (ref 0.61–1.24)
GFR, Estimated: >60 mL/min (ref >60)
Glucose, Bld: 124 mg/dL — ABNORMAL HIGH (ref 70–99)
Potassium: 3.5 mmol/L (ref 3.5–5.1)
Sodium: 138 mmol/L (ref 135–145)

## 2022-03-13 LAB — CBC WITH DIFFERENTIAL/PLATELET
Abs Immature Granulocytes: 0.08 10*3/uL — ABNORMAL HIGH (ref 0.00–0.07)
Basophils Absolute: 0 10*3/uL (ref 0.0–0.1)
Basophils Relative: 0 %
Eosinophils Absolute: 0 10*3/uL (ref 0.0–0.5)
Eosinophils Relative: 0 %
HCT: 33.1 % — ABNORMAL LOW (ref 39.0–52.0)
Hemoglobin: 10.8 g/dL — ABNORMAL LOW (ref 13.0–17.0)
Immature Granulocytes: 1 %
Lymphocytes Relative: 7 %
Lymphs Abs: 0.9 10*3/uL (ref 0.7–4.0)
MCH: 30.3 pg (ref 26.0–34.0)
MCHC: 32.6 g/dL (ref 30.0–36.0)
MCV: 93 fL (ref 80.0–100.0)
Monocytes Absolute: 0.4 10*3/uL (ref 0.1–1.0)
Monocytes Relative: 3 %
Neutro Abs: 11.9 10*3/uL — ABNORMAL HIGH (ref 1.7–7.7)
Neutrophils Relative %: 89 %
Platelets: 223 10*3/uL (ref 150–400)
RBC: 3.56 MIL/uL — ABNORMAL LOW (ref 4.22–5.81)
RDW: 12.1 % (ref 11.5–15.5)
WBC: 13.4 10*3/uL — ABNORMAL HIGH (ref 4.0–10.5)
nRBC: 0 % (ref 0.0–0.2)

## 2022-03-13 LAB — SODIUM
Sodium: 139 mmol/L (ref 135–145)
Sodium: 140 mmol/L (ref 135–145)

## 2022-03-13 MED ORDER — MUPIROCIN 2 % EX OINT
1.0000 | TOPICAL_OINTMENT | Freq: Two times a day (BID) | CUTANEOUS | Status: DC
  Administered 2022-03-13 – 2022-03-14 (×3): 1 via NASAL
  Filled 2022-03-13: qty 22

## 2022-03-13 MED ORDER — ORAL CARE MOUTH RINSE: 15.0000 mL | OROMUCOSAL | Status: DC | PRN

## 2022-03-13 NOTE — Progress Notes (Signed)
Pt A/O, RA, VS stable.Neuro checks q1 hr without changes States that at home when he is standing in the shower , he feels like fainting. PT ordered. Pt ambulated to and from chair with 1 assist. Denies dizziness, lightlessness. Family st the bedside, updated.

## 2022-03-13 NOTE — Progress Notes (Addendum)
  NEUROSURGERY PROGRESS NOTE   Pt seen and examined. No issues overnight. No HA, no nasal discharge. No changes in vision.  EXAM: Temp:  [97.6 F (36.4 C)-98 F (36.7 C)] 97.9 F (36.6 C) (01/20 0400) Pulse Rate:  [79-99] 82 (01/20 0800) Resp:  [12-21] 16 (01/20 0800) BP: (106-160)/(65-111) 110/70 (01/20 0800) SpO2:  [91 %-100 %] 92 % (01/20 0800) Arterial Line BP: (102-184)/(56-115) 119/69 (01/20 0800) Intake/Output      01/19 0701 01/20 0700 01/20 0701 01/21 0700   P.O.  120   I.V. (mL/kg) 1809.3 (21.4) 61.6 (0.7)   IV Piggyback 700    Total Intake(mL/kg) 2509.3 (29.7) 181.6 (2.2)   Urine (mL/kg/hr) 2115 (1) 200 (1)   Blood 200    Total Output 2315 200   Net +194.3 -18.4         Awake, alert, oriented Speech fluent CN intact MAE good strength   LABS: Lab Results  Component Value Date   CREATININE 1.00 03/13/2022   BUN 11 03/13/2022   NA 138 03/13/2022   K 3.5 03/13/2022   CL 108 03/13/2022   CO2 22 03/13/2022   Lab Results  Component Value Date   WBC 13.4 (H) 03/13/2022   HGB 10.8 (L) 03/13/2022   HCT 33.1 (L) 03/13/2022   MCV 93.0 03/13/2022   PLT 223 03/13/2022    IMPRESSION: - 67 y.o. male POD#1 transsphenoidal resection of pituitary tumor, doing well. Na stable, normal U.O, no DI. No rhinorrhea.  PLAN: - d/c A-line today - begin to mobilize - Cont hydrocortisone 20/10    Consuella Lose, MD [facility]

## 2022-03-14 LAB — SODIUM: Sodium: 142 mmol/L (ref 135–145)

## 2022-03-14 MED ORDER — SALINE SPRAY 0.65 % NA SOLN: 2.0000 | NASAL | 0 refills | Status: DC

## 2022-03-14 NOTE — Progress Notes (Signed)
  NEUROSURGERY PROGRESS NOTE   No issues overnight. No HA, no nasal discharge. Ambulating with minimal assistance, tolerating diet.  EXAM:  BP (!) 154/96   Pulse 76   Temp 97.8 F (36.6 C) (Oral)   Resp 15   Ht '5\' 11"'$  (1.803 m)   Wt 84.4 kg   SpO2 95%   BMI 25.94 kg/m   Awake, alert, oriented  Speech fluent, appropriate  CN grossly intact  5/5 BUE/BLE   IMPRESSION:  67 y.o. male POD#2 s/p transsphenoidal resection of tumor, doing well  PLAN: - can d/c home today   Consuella Lose, MD [facility]

## 2022-03-14 NOTE — Plan of Care (Signed)
Pt A/Ox4, pleasant and cooperative.Denies pain. Ambulation was done and no concerns from staff. AVS printed and provided to the pt. Education and instruction given. All questions answered. Belonging and wife with the pt, ready to DC home

## 2022-03-14 NOTE — Discharge Summary (Signed)
Physician Discharge Summary  Patient ID: Bobby York MRN: [id number] DOB/AGE: [birth year] 68 y.o.  Admit date: 03/12/2022 Discharge date: 03/14/2022  Admission Diagnoses:  Pituitary mass  Discharge Diagnoses:  Same Principal Problem:   Pituitary mass [facility] Active Problems:   Pituitary tumor   Discharged Condition: Stable  Hospital Course:  Bobby York is a 67 y.o. male admitted after elective transsphenoidal resection of pituitary mass. Pt did well postoperatively, without CSF leak or DI. He remained neurologically well, was ambulating with minimal assistance, tolerating diet, voiding normally and requested d/c home on POD#2.  Treatments: Surgery - transsphenoidal resection of pituitary tumor.  Discharge Exam: Blood pressure (!) 154/96, pulse 76, temperature 97.8 F (36.6 C), temperature source Oral, resp. rate 15, height '5\' 11"'$  (1.803 m), weight 84.4 kg, SpO2 95 %. Awake, alert, oriented Speech fluent, appropriate CN grossly intact 5/5 BUE/BLE   Disposition: Discharge disposition: 01-Home or Self Care       Discharge Instructions     Call MD for:  redness, tenderness, or signs of infection (pain, swelling, redness, odor or green/yellow discharge around incision site)   Complete by: As directed    Call MD for:  temperature >100.4   Complete by: As directed    Diet - low sodium heart healthy   Complete by: As directed    Discharge instructions   Complete by: As directed    Walk at home as much as possible, at least 4 times / day   Increase activity slowly   Complete by: As directed    Lifting restrictions   Complete by: As directed    No lifting > 10 lbs   May shower / Bathe   Complete by: As directed    48 hours after surgery   May walk up steps   Complete by: As directed    No dressing needed   Complete by: As directed    Other Restrictions   Complete by: As directed    No bending/twisting at waist      Allergies as of 03/14/2022   No  Known Allergies      Medication List     STOP taking these medications    famotidine 20 MG tablet Commonly known as: Pepcid   pantoprazole 40 MG tablet Commonly known as: Protonix       TAKE these medications    sodium chloride 0.65 % Soln nasal spray Commonly known as: OCEAN Place 2 sprays into both nostrils every 4 (four) hours while awake.   traZODone 50 MG tablet Commonly known as: DESYREL Take 25 mg by mouth at bedtime.               Discharge Care Instructions  (From admission, onward)           Start     Ordered   03/14/22 0000  No dressing needed        03/14/22 0930            Follow-up Information     Skotnicki, Meghan A, DO Follow up on 03/26/2022.   Specialty: Otolaryngology Why: Follow up as scheduled on 02/02 Contact information: [place] [address] [phone]         Vallarie Mare, MD Follow up.   Specialty: Neurosurgery Contact information: [address] [place] [address] [phone]         [name], MD Follow up in 3 week(s).   Specialty: Family  Medicine Contact information: [address] [place] [address] [phone]                 Signed: Jairo Ben 03/14/2022, 9:31 AM

## 2022-03-15 ENCOUNTER — Encounter (HOSPITAL_COMMUNITY): Payer: Self-pay | Admitting: Neurosurgery

## 2022-03-15 LAB — SURGICAL PATHOLOGY

## 2022-03-15 NOTE — Anesthesia Postprocedure Evaluation (Signed)
Anesthesia Post Note  Patient: AVYAN LIVESAY  Procedure(s) Performed: ENDOSCOPIC ENDONASAL TRANSPHENOIDAL RESECTION OF PITUITARY MASS (Nose) TRANSPHENOIDAL ENDOSCOPIC RESCTION OF PITUITARY ADENOMA (Nose)     Patient location during evaluation: PACU Anesthesia Type: General Level of consciousness: awake and alert Pain management: pain level controlled Vital Signs Assessment: post-procedure vital signs reviewed and stable Respiratory status: spontaneous breathing, nonlabored ventilation and respiratory function stable Cardiovascular status: blood pressure returned to baseline and stable Postop Assessment: no apparent nausea or vomiting Anesthetic complications: no   No notable events documented.  Last Vitals:  Vitals:   03/14/22 1033 03/14/22 1054  BP: (!) 162/100 116/85  Pulse: 71 70  Resp: (!) 23 18  Temp:    SpO2: 98% 99%    Last Pain:  Vitals:   03/14/22 0800  TempSrc: Oral  PainSc: 0-No pain                 Keiji Melland

## 2022-03-16 LAB — T3: T3, Total: 76 ng/dL (ref 71–180)

## 2022-03-29 DIAGNOSIS — E538 Deficiency of other specified B group vitamins: Secondary | ICD-10-CM | POA: Diagnosis not present

## 2022-03-29 DIAGNOSIS — E663 Overweight: Secondary | ICD-10-CM | POA: Diagnosis not present

## 2022-03-29 DIAGNOSIS — G90A Postural orthostatic tachycardia syndrome (POTS): Secondary | ICD-10-CM | POA: Diagnosis not present

## 2022-03-29 DIAGNOSIS — B182 Chronic viral hepatitis C: Secondary | ICD-10-CM | POA: Diagnosis not present

## 2022-03-29 DIAGNOSIS — Z9889 Other specified postprocedural states: Secondary | ICD-10-CM | POA: Diagnosis not present

## 2022-03-29 DIAGNOSIS — E7849 Other hyperlipidemia: Secondary | ICD-10-CM | POA: Diagnosis not present

## 2022-03-29 DIAGNOSIS — E559 Vitamin D deficiency, unspecified: Secondary | ICD-10-CM | POA: Diagnosis not present

## 2022-03-29 DIAGNOSIS — E893 Postprocedural hypopituitarism: Secondary | ICD-10-CM | POA: Diagnosis not present

## 2022-03-29 DIAGNOSIS — Z125 Encounter for screening for malignant neoplasm of prostate: Secondary | ICD-10-CM | POA: Diagnosis not present

## 2022-03-29 DIAGNOSIS — F329 Major depressive disorder, single episode, unspecified: Secondary | ICD-10-CM | POA: Diagnosis not present

## 2022-03-29 DIAGNOSIS — E782 Mixed hyperlipidemia: Secondary | ICD-10-CM | POA: Diagnosis not present

## 2022-03-29 DIAGNOSIS — Z6826 Body mass index (BMI) 26.0-26.9, adult: Secondary | ICD-10-CM | POA: Diagnosis not present

## 2022-03-29 DIAGNOSIS — D352 Benign neoplasm of pituitary gland: Secondary | ICD-10-CM | POA: Diagnosis not present

## 2022-03-29 DIAGNOSIS — J449 Chronic obstructive pulmonary disease, unspecified: Secondary | ICD-10-CM | POA: Diagnosis not present

## 2022-03-31 DIAGNOSIS — R42 Dizziness and giddiness: Secondary | ICD-10-CM | POA: Diagnosis not present

## 2022-04-28 DIAGNOSIS — Z1331 Encounter for screening for depression: Secondary | ICD-10-CM | POA: Diagnosis not present

## 2022-04-28 DIAGNOSIS — Z0001 Encounter for general adult medical examination with abnormal findings: Secondary | ICD-10-CM | POA: Diagnosis not present

## 2022-04-28 DIAGNOSIS — Z6826 Body mass index (BMI) 26.0-26.9, adult: Secondary | ICD-10-CM | POA: Diagnosis not present

## 2022-05-18 ENCOUNTER — Encounter: Payer: Self-pay | Admitting: "Endocrinology

## 2022-06-17 ENCOUNTER — Other Ambulatory Visit (HOSPITAL_COMMUNITY): Payer: Self-pay | Admitting: Internal Medicine

## 2022-06-17 DIAGNOSIS — Z0189 Encounter for other specified special examinations: Secondary | ICD-10-CM

## 2022-06-17 DIAGNOSIS — Z136 Encounter for screening for cardiovascular disorders: Secondary | ICD-10-CM

## 2022-06-21 ENCOUNTER — Encounter: Payer: Self-pay | Admitting: "Endocrinology

## 2022-06-21 ENCOUNTER — Ambulatory Visit (INDEPENDENT_AMBULATORY_CARE_PROVIDER_SITE_OTHER): Payer: No Typology Code available for payment source | Admitting: "Endocrinology

## 2022-06-21 VITALS — BP 100/74 | HR 76 | Ht 71.0 in | Wt 191.4 lb

## 2022-06-21 DIAGNOSIS — Z9889 Other specified postprocedural states: Secondary | ICD-10-CM

## 2022-06-21 DIAGNOSIS — E893 Postprocedural hypopituitarism: Secondary | ICD-10-CM | POA: Diagnosis not present

## 2022-06-21 NOTE — Progress Notes (Signed)
Endocrinology Consult Note                                            06/21/2022, 8:11 PM   Subjective:    Patient ID: Bobby York, male    DOB: 03-Jun-1955, PCP Assunta Found, MD   Past Medical History:  Diagnosis Date   Bipolar 1 disorder [facility])    diagnosed 2004   Dyspnea    Hepatitis    Hep C   Substance abuse (HCC) 2021   Past Surgical History:  Procedure Laterality Date   BACK SURGERY     two-lumbar disc x2   CLAVICLE SURGERY     COLONOSCOPY WITH PROPOFOL N/A 07/25/2014   FAO:ZHYQMV rectal polyp otherwise normal . hyperplastic polyp. TCS in 07/2024   CRANIOTOMY N/A 03/12/2022   Procedure: ENDOSCOPIC ENDONASAL TRANSPHENOIDAL RESECTION OF PITUITARY MASS;  Surgeon: Bedelia Person, MD;  Location: [facility] OR;  Service: Neurosurgery;  Laterality: N/A;   ESOPHAGEAL DILATION N/A 07/25/2014   Procedure: ESOPHAGEAL DILATION;  Surgeon: Corbin Ade, MD;  Location: [facility] ORS;  Service: Endoscopy;  Laterality: N/A;  Malony dilators # 56,   ESOPHAGOGASTRODUODENOSCOPY (EGD) WITH PROPOFOL N/A 07/25/2014   HQI:ONGE erosive reflux/s/p dilator/HH. +hpylor    NECK SURGERY     TRANSPHENOIDAL APPROACH EXPOSURE N/A 03/12/2022   Procedure: TRANSPHENOIDAL ENDOSCOPIC RESCTION OF PITUITARY ADENOMA;  Surgeon: Laren Boom, DO;  Location: [facility] OR;  Service: ENT;  Laterality: N/A;   Social History   Socioeconomic History   Marital status: Married    Spouse name: Not on file   Number of children: Not on file   Years of education: Not on file   Highest education level: Not on file  Occupational History   Not on file  Tobacco Use   Smoking status: Former    Packs/day: 0.40    Years: 40.00    Additional pack years: 0.00    Total pack years: 16.00    Types: Cigarettes    Start date: 05/28/1970    Quit date: 03/2021    Years since quitting: 1.2   Smokeless tobacco: Never   Tobacco comments:    Pt states he stopped smoking about 4-5 months ago // mr CMA 08/27/2021   Vaping Use   Vaping  Use: Never used  Substance and Sexual Activity   Alcohol use: Not Currently    Comment: AS of today, last use in 2021   Drug use: Not Currently    Types: Marijuana    Comment: AS of today, last use in 2021   Sexual activity: Yes    Birth control/protection: None  Other Topics Concern   Not on file  Social History Narrative   Not on file   Social Determinants of Health   Financial Resource Strain: Not on file  Food Insecurity: Not on file  Transportation Needs: Not on file  Physical Activity: Not on file  Stress: Not on file  Social Connections: Not on file   Family History  Problem Relation Age of Onset   Heart disease Mother    Diabetes Mother    Prostate cancer Father    Liver disease Neg Hx    Colon cancer Neg Hx    Outpatient Encounter Medications as of 06/21/2022  Medication Sig   famotidine (PEPCID) 20 MG tablet Take 1 tablet by mouth  daily.   meclizine (ANTIVERT) 25 MG tablet Take 1 tablet by mouth every 6 (six) hours as needed.   Vitamin D, Ergocalciferol, (DRISDOL) 1.25 MG (50000 UNIT) CAPS capsule Take 50,000 Units by mouth every 7 (seven) days.   sodium chloride (OCEAN) 0.65 % SOLN nasal spray Place 2 sprays into both nostrils every 4 (four) hours while awake. (Patient not taking: Reported on 06/21/2022)   traZODone (DESYREL) 50 MG tablet Take 25 mg by mouth at bedtime.   No facility-administered encounter medications on file as of 06/21/2022.   ALLERGIES: No Known Allergies  VACCINATION STATUS: Immunization History  Administered Date(s) Administered   Influenza,inj,Quad PF,6+ Mos 04/18/2016   Influenza-Unspecified 11/23/2019, 11/29/2020   Moderna Covid-19 Vaccine Bivalent Booster 86yrs & up 12/06/2020   Moderna Sars-Covid-2 Vaccination 05/03/2019, 06/02/2019, 12/14/2019   Pneumococcal Polysaccharide-23 04/18/2016, 04/27/2018   Td 04/27/2018   Td (Adult) 04/27/2018   Tdap 01/08/1996   Zoster Recombinat (Shingrix) 04/28/2020, 10/28/2020    HPI Bobby York is 67 y.o. male who presents today with a medical history as above. he is being seen in consultation for history of pituitary adenoma status postresection requested by Assunta Found, MD. Patient is not an optimal historian, accompanied by his wife to clinic.  History is obtained from the family as well as chart review.  According to review of his records, he underwent transsphenoidal resection of pituitary adenoma on March 12, 2022 after presurgical workup.  His initial endocrine assessment records are not available to review.  He is not currently on any hormone replacement therapy. He denies headaches nor blurry vision or visual field deficits at this time. He has some baseline right-sided tremor and disequilibrium on and off. His other medical problems include diabetes type 2, high blood pressure and high cholesterol.  He also has CHF.  His on trazodone, meclizine, famotidine and vitamin D. His surgical pathology review showed pituitary adenoma with gross description of 2 x 1.7 x 0.3 cm specimen. He does not have postsurgical workup for thyroid function, adrenal assessment.  Review of Systems  Constitutional: + Mildly fluctuating body weight, + fatigue, no subjective hyperthermia, no subjective hypothermia Eyes: no blurry vision, no xerophthalmia ENT: no sore throat, no nodules palpated in throat, no dysphagia/odynophagia, no hoarseness Cardiovascular: no Chest Pain, no Shortness of Breath, no palpitations, no leg swelling Respiratory: no cough, no shortness of breath Gastrointestinal: no Nausea/Vomiting/Diarhhea Musculoskeletal: no muscle/joint aches Skin: no rashes Neurological: no tremors, no numbness, no tingling, no dizziness Psychiatric: no depression, no anxiety  Objective:       06/21/2022    2:31 PM 03/14/2022   10:54 AM 03/14/2022   10:33 AM  Vitals with BMI  Height 5\' 11"     Weight 191 lbs 6 oz    BMI 26.71    Systolic 100 116 161  Diastolic 74 85 100  Pulse 76  70 71    BP 100/74   Pulse 76   Ht 5\' 11"  (1.803 m)   Wt 191 lb 6.4 oz (86.8 kg)   BMI 26.69 kg/m   Wt Readings from Last 3 Encounters:  06/21/22 191 lb 6.4 oz (86.8 kg)  03/12/22 186 lb (84.4 kg)  03/09/22 188 lb 6.4 oz (85.5 kg)    Physical Exam  Constitutional:  Body mass index is 26.69 kg/m.,  not in acute distress, normal state of mind Eyes: PERRLA, EOMI, no exophthalmos ENT: moist mucous membranes, no gross thyromegaly, no gross cervical lymphadenopathy Cardiovascular: normal precordial activity, Regular Rate  and Rhythm, no Murmur/Rubs/Gallops Respiratory:  adequate breathing efforts, no gross chest deformity, Clear to auscultation bilaterally Gastrointestinal: abdomen soft, Non -tender, No distension, Bowel Sounds present, no gross organomegaly Musculoskeletal: no gross deformities, strength intact in all four extremities, no peripheral edema Skin: moist, warm, no rashes Neurological: + tremor of right upper extremity at rest,  Deep tendon reflexes normal in bilateral lower extremities.  CMP ( most recent) CMP     Component Value Date/Time   NA 142 03/14/2022 0754   K 3.5 03/13/2022 0607   CL 108 03/13/2022 0607   CO2 22 03/13/2022 0607   GLUCOSE 124 (H) 03/13/2022 0607   BUN 11 03/13/2022 0607   CREATININE 1.00 03/13/2022 0607   CREATININE 1.06 06/02/2015 0921   CALCIUM 8.8 (L) 03/13/2022 0607   PROT 6.9 03/09/2022 1007   ALBUMIN 3.7 03/09/2022 1007   AST 24 03/09/2022 1007   ALT 16 03/09/2022 1007   ALKPHOS 51 03/09/2022 1007   BILITOT 0.5 03/09/2022 1007   GFRNONAA >60 03/13/2022 0607   GFRAA >60 11/22/2019 1221      Lab Results  Component Value Date   TSH 0.409 03/13/2022   TSH 1.298 12/11/2021   TSH 1.02 09/28/2021   FREET4 0.72 12/11/2021           Assessment & Plan:   1. H/O partial hypophysectomy (HCC)  - Bobby York  is being seen at a kind request of Assunta Found, MD. - I have reviewed his available  records and clinically  evaluated the patient. - Based on these reviews, he has history of pituitary adenoma status post transsphenoidal resection on March 12, 2022.  Patient is not on any hormone replacement therapy at this time.  He displays nonspecific symptoms including fatigue and disequilibrium. He will be sent to lab for baseline assessment of thyroid function, adrenal function, and gonadal reserve, as well as CMP.  He will also be assessed for prolactin levels. - Comprehensive metabolic panel - TSH - T4, free - T3, free - Prolactin - Testosterone, Free, Total, SHBG - Cortisol-am, blood  If he presents with hormonal deficits, will be approached for supplements/replacements.  If prolactin is elevated, he may be considered for repeat pituitary/sella imaging on subsequent visits. - I did not initiate any new prescriptions today. - he is advised to maintain close follow up with Assunta Found, MD for primary care needs.   - Time spent with the patient: 45 minutes, of which >50% was spent in  counseling him about his history of transsphenoidal partial hypophysectomy and the rest in obtaining information about his symptoms, reviewing his previous labs/studies ( including abstractions from other facilities),  evaluations, and treatments,  and developing a plan to confirm diagnosis and long term treatment based on the latest standards of care/guidelines; and documenting his care.  Norina Buzzard participated in the discussions, expressed understanding, and voiced agreement with the above plans.  All questions were answered to his satisfaction. he is encouraged to contact clinic should he have any questions or concerns prior to his return visit.  Follow up plan: Return in about 4 days (around 06/25/2022) for Fasting Labs  in AM B4 8.   Marquis Lunch, MD [facility] [address] [place], [place] [address] Phone: [phone]  Fax: [phone]     06/21/2022,  8:11 PM  This note was partially dictated with voice recognition software. Similar sounding words can be transcribed inadequately or may not  be corrected  upon review.

## 2022-06-22 DIAGNOSIS — E893 Postprocedural hypopituitarism: Secondary | ICD-10-CM | POA: Diagnosis not present

## 2022-06-22 DIAGNOSIS — Z9889 Other specified postprocedural states: Secondary | ICD-10-CM | POA: Diagnosis not present

## 2022-06-26 LAB — TSH: TSH: 1.47 u[IU]/mL (ref 0.450–4.500)

## 2022-06-26 LAB — COMPREHENSIVE METABOLIC PANEL
ALT: 18 IU/L (ref 0–44)
AST: 24 IU/L (ref 0–40)
Albumin/Globulin Ratio: 1.6 (ref 1.2–2.2)
Albumin: 4.1 g/dL (ref 3.9–4.9)
Alkaline Phosphatase: 82 IU/L (ref 44–121)
BUN/Creatinine Ratio: 8 — ABNORMAL LOW (ref 10–24)
BUN: 9 mg/dL (ref 8–27)
Bilirubin Total: 0.4 mg/dL (ref 0.0–1.2)
CO2: 23 mmol/L (ref 20–29)
Calcium: 9.4 mg/dL (ref 8.6–10.2)
Chloride: 103 mmol/L (ref 96–106)
Creatinine, Ser: 1.2 mg/dL (ref 0.76–1.27)
Globulin, Total: 2.6 g/dL (ref 1.5–4.5)
Glucose: 108 mg/dL — ABNORMAL HIGH (ref 70–99)
Potassium: 4 mmol/L (ref 3.5–5.2)
Sodium: 142 mmol/L (ref 134–144)
Total Protein: 6.7 g/dL (ref 6.0–8.5)
eGFR: 66 mL/min/{1.73_m2} (ref >59)

## 2022-06-26 LAB — TESTOSTERONE, FREE, TOTAL, SHBG
Sex Hormone Binding: 40.4 nmol/L (ref 19.3–76.4)
Testosterone, Free: 5.1 pg/mL — ABNORMAL LOW (ref 6.6–18.1)
Testosterone: 460 ng/dL (ref 264–916)

## 2022-06-26 LAB — PROLACTIN: Prolactin: 16.5 ng/mL (ref 3.6–25.2)

## 2022-06-26 LAB — T4, FREE: Free T4: 0.96 ng/dL (ref 0.82–1.77)

## 2022-06-26 LAB — CORTISOL-AM, BLOOD: Cortisol - AM: 11.1 ug/dL (ref 6.2–19.4)

## 2022-06-26 LAB — T3, FREE: T3, Free: 3.2 pg/mL (ref 2.0–4.4)

## 2022-06-28 ENCOUNTER — Encounter: Payer: Self-pay | Admitting: "Endocrinology

## 2022-06-28 ENCOUNTER — Ambulatory Visit (INDEPENDENT_AMBULATORY_CARE_PROVIDER_SITE_OTHER): Payer: No Typology Code available for payment source | Admitting: "Endocrinology

## 2022-06-28 VITALS — BP 92/66 | HR 92 | Ht 71.0 in | Wt 190.0 lb

## 2022-06-28 DIAGNOSIS — Z9889 Other specified postprocedural states: Secondary | ICD-10-CM | POA: Diagnosis not present

## 2022-06-28 DIAGNOSIS — E893 Postprocedural hypopituitarism: Secondary | ICD-10-CM

## 2022-06-28 NOTE — Progress Notes (Signed)
06/28/2022, 5:44 PM  Endocrinology follow-up note   Subjective:    Patient ID: Bobby York, male    DOB: 04-Jun-1955, PCP Assunta Found, MD   Past Medical History:  Diagnosis Date   Bipolar 1 disorder [facility])    diagnosed 2004   Dyspnea    Hepatitis    Hep C   Substance abuse (HCC) 2021   Past Surgical History:  Procedure Laterality Date   BACK SURGERY     two-lumbar disc x2   CLAVICLE SURGERY     COLONOSCOPY WITH PROPOFOL N/A 07/25/2014   VHQ:IONGEX rectal polyp otherwise normal . hyperplastic polyp. TCS in 07/2024   CRANIOTOMY N/A 03/12/2022   Procedure: ENDOSCOPIC ENDONASAL TRANSPHENOIDAL RESECTION OF PITUITARY MASS;  Surgeon: Bedelia Person, MD;  Location: [facility] OR;  Service: Neurosurgery;  Laterality: N/A;   ESOPHAGEAL DILATION N/A 07/25/2014   Procedure: ESOPHAGEAL DILATION;  Surgeon: Corbin Ade, MD;  Location: [facility];  Service: Endoscopy;  Laterality: N/A;  Malony dilators # 56,   ESOPHAGOGASTRODUODENOSCOPY (EGD) WITH PROPOFOL N/A 07/25/2014   BMW:UXLK erosive reflux/s/p dilator/HH. +hpylor    NECK SURGERY     TRANSPHENOIDAL APPROACH EXPOSURE N/A 03/12/2022   Procedure: TRANSPHENOIDAL ENDOSCOPIC RESCTION OF PITUITARY ADENOMA;  Surgeon: Laren Boom, DO;  Location: [facility] OR;  Service: ENT;  Laterality: N/A;   Social History   Socioeconomic History   Marital status: Married    Spouse name: Not on file   Number of children: Not on file   Years of education: Not on file   Highest education level: Not on file  Occupational History   Not on file  Tobacco Use   Smoking status: Former    Packs/day: 0.40    Years: 40.00    Additional pack years: 0.00    Total pack years: 16.00    Types: Cigarettes    Start date: 05/28/1970    Quit date: 03/2021    Years since quitting: 1.2   Smokeless tobacco: Never   Tobacco comments:    Pt states he stopped smoking about 4-5 months ago // mr CMA 08/27/2021   Vaping Use    Vaping Use: Never used  Substance and Sexual Activity   Alcohol use: Not Currently    Comment: AS of today, last use in 2021   Drug use: Not Currently    Types: Marijuana    Comment: AS of today, last use in 2021   Sexual activity: Yes    Birth control/protection: None  Other Topics Concern   Not on file  Social History Narrative   Not on file   Social Determinants of Health   Financial Resource Strain: Not on file  Food Insecurity: Not on file  Transportation Needs: Not on file  Physical Activity: Not on file  Stress: Not on file  Social Connections: Not on file   Family History  Problem Relation Age of Onset   Heart disease Mother    Diabetes Mother    Prostate cancer Father    Liver disease Neg Hx    Colon cancer Neg Hx    Outpatient Encounter Medications as of 06/28/2022  Medication Sig   famotidine (PEPCID) 20 MG tablet Take 1 tablet by  mouth daily.   meclizine (ANTIVERT) 25 MG tablet Take 1 tablet by mouth every 6 (six) hours as needed.   sodium chloride (OCEAN) 0.65 % SOLN nasal spray Place 2 sprays into both nostrils every 4 (four) hours while awake. (Patient not taking: Reported on 06/21/2022)   traZODone (DESYREL) 50 MG tablet Take 25 mg by mouth at bedtime.   Vitamin D, Ergocalciferol, (DRISDOL) 1.25 MG (50000 UNIT) CAPS capsule Take 50,000 Units by mouth every 7 (seven) days.   No facility-administered encounter medications on file as of 06/28/2022.   ALLERGIES: No Known Allergies  VACCINATION STATUS: Immunization History  Administered Date(s) Administered   Influenza,inj,Quad PF,6+ Mos 04/18/2016   Influenza-Unspecified 11/23/2019, 11/29/2020   Moderna Covid-19 Vaccine Bivalent Booster 71yrs & up 12/06/2020   Moderna Sars-Covid-2 Vaccination 05/03/2019, 06/02/2019, 12/14/2019   Pneumococcal Polysaccharide-23 04/18/2016, 04/27/2018   Td 04/27/2018   Td (Adult) 04/27/2018   Tdap 01/08/1996   Zoster Recombinat (Shingrix) 04/28/2020, 10/28/2020     HPI Bobby York is 67 y.o. male who presents today with a medical history as above. he is being seen in follow-up after he was seen in consultation for history of pituitary adenoma status postresection requested by Assunta Found, MD. Patient is not an optimal historian, accompanied by his wife to clinic.  History is obtained from the family as well as chart review.  According to review of his records, he underwent transsphenoidal resection of pituitary adenoma on March 12, 2022 after presurgical workup.  His initial endocrine assessment records are not available to review.  He is not currently on any hormone replacement therapy. After his first visit, he was sent for baseline endocrine assessment, results are as follows. He denies headaches nor blurry vision or visual field deficits at this time. He has some baseline right-sided tremor and disequilibrium on and off. His other medical problems include diabetes type 2, high blood pressure and high cholesterol.  He also has CHF.  His on trazodone, meclizine, famotidine and vitamin D. His surgical pathology review showed pituitary adenoma with gross description of 2 x 1.7 x 0.3 cm specimen. He does not have postsurgical workup for thyroid function, adrenal assessment.  Review of Systems  Constitutional: + Mildly fluctuating body weight, + fatigue, no subjective hyperthermia, no subjective hypothermia   Objective:       06/28/2022    8:54 AM 06/21/2022    2:31 PM 03/14/2022   10:54 AM  Vitals with BMI  Height 5\' 11"  5\' 11"    Weight 190 lbs 191 lbs 6 oz   BMI 26.51 26.71   Systolic 92 100 116  Diastolic 66 74 85  Pulse 92 76 70    BP 92/66   Pulse 92   Ht 5\' 11"  (1.803 m)   Wt 190 lb (86.2 kg)   BMI 26.50 kg/m   Wt Readings from Last 3 Encounters:  06/28/22 190 lb (86.2 kg)  06/21/22 191 lb 6.4 oz (86.8 kg)  03/12/22 186 lb (84.4 kg)    Physical Exam  Constitutional:  Body mass index is 26.5 kg/m.,  not in acute  distress, normal state of mind Eyes: PERRLA, EOMI, no exophthalmos ENT: moist mucous membranes, no gross thyromegaly, no gross cervical lymphadenopathy  Skin: moist, warm, no rashes Neurological: + tremor of right upper extremity at rest,  Deep tendon reflexes normal in bilateral lower extremities.  CMP ( most recent) CMP     Component Value Date/Time   NA 142 06/22/2022 0750   K 4.0  06/22/2022 0750   CL 103 06/22/2022 0750   CO2 23 06/22/2022 0750   GLUCOSE 108 (H) 06/22/2022 0750   GLUCOSE 124 (H) 03/13/2022 0607   BUN 9 06/22/2022 0750   CREATININE 1.20 06/22/2022 0750   CREATININE 1.06 06/02/2015 0921   CALCIUM 9.4 06/22/2022 0750   PROT 6.7 06/22/2022 0750   ALBUMIN 4.1 06/22/2022 0750   AST 24 06/22/2022 0750   ALT 18 06/22/2022 0750   ALKPHOS 82 06/22/2022 0750   BILITOT 0.4 06/22/2022 0750   GFRNONAA >60 03/13/2022 0607   GFRAA >60 11/22/2019 1221     Lab Results  Component Value Date   TSH 1.470 06/22/2022   TSH 0.409 03/13/2022   TSH 1.298 12/11/2021   TSH 1.02 09/28/2021   FREET4 0.96 06/22/2022   FREET4 0.72 12/11/2021      Assessment & Plan:   1. H/O partial hypophysectomy (HCC)  - I have reviewed his new and available  records and clinically evaluated the patient. - Based on these reviews, he has history of pituitary adenoma status post transsphenoidal resection on March 12, 2022.  His previsit labs show absence of any major endocrine deficit.  He will not need any hormone replacement therapy at this time.  He will have repeat assessment of some of his labs in 6 months with office visit.  His prolactin is normal, no need for pituitary/sella imaging at this time. - I did not initiate any new prescriptions today. - he is advised to maintain close follow up with Assunta Found, MD for primary care needs.  I spent  22  minutes in the care of the patient today including review of labs from Thyroid Function, CMP, and other relevant labs ; imaging/biopsy  records (current and previous including abstractions from other facilities); face-to-face time discussing  his lab results and symptoms, medications doses, his options of short and long term treatment based on the latest standards of care / guidelines;   and documenting the encounter.  Norina Buzzard  participated in the discussions, expressed understanding, and voiced agreement with the above plans.  All questions were answered to his satisfaction. he is encouraged to contact clinic should he have any questions or concerns prior to his return visit.   Follow up plan: Return in about 6 months (around 12/29/2022) for Fasting Labs  in AM B4 8.   Marquis Lunch, MD [facility] [address] [place], [place] [address] Phone: [phone]  Fax: [phone]     06/28/2022, 5:44 PM  This note was partially dictated with voice recognition software. Similar sounding words can be transcribed inadequately or may not  be corrected upon review.

## 2022-07-02 ENCOUNTER — Ambulatory Visit (HOSPITAL_COMMUNITY)
Admission: RE | Admit: 2022-07-02 | Discharge: 2022-07-02 | Disposition: A | Discharge status: Home / Self Care | Payer: No Typology Code available for payment source | Source: Ambulatory Visit | Attending: Internal Medicine | Admitting: Internal Medicine

## 2022-07-02 DIAGNOSIS — Z136 Encounter for screening for cardiovascular disorders: Secondary | ICD-10-CM

## 2022-07-02 DIAGNOSIS — Z0189 Encounter for other specified special examinations: Secondary | ICD-10-CM | POA: Diagnosis present

## 2022-07-07 ENCOUNTER — Ambulatory Visit (HOSPITAL_COMMUNITY): Payer: No Typology Code available for payment source

## 2022-07-29 DIAGNOSIS — H524 Presbyopia: Secondary | ICD-10-CM | POA: Diagnosis not present

## 2022-07-29 DIAGNOSIS — H2513 Age-related nuclear cataract, bilateral: Secondary | ICD-10-CM | POA: Diagnosis not present

## 2022-07-29 DIAGNOSIS — H40023 Open angle with borderline findings, high risk, bilateral: Secondary | ICD-10-CM | POA: Diagnosis not present

## 2022-07-29 DIAGNOSIS — H5203 Hypermetropia, bilateral: Secondary | ICD-10-CM | POA: Diagnosis not present

## 2022-08-11 DIAGNOSIS — E893 Postprocedural hypopituitarism: Secondary | ICD-10-CM | POA: Diagnosis not present

## 2022-08-11 DIAGNOSIS — E559 Vitamin D deficiency, unspecified: Secondary | ICD-10-CM | POA: Diagnosis not present

## 2022-08-11 DIAGNOSIS — B182 Chronic viral hepatitis C: Secondary | ICD-10-CM | POA: Diagnosis not present

## 2022-08-11 DIAGNOSIS — K219 Gastro-esophageal reflux disease without esophagitis: Secondary | ICD-10-CM | POA: Diagnosis not present

## 2022-08-11 DIAGNOSIS — G90A Postural orthostatic tachycardia syndrome (POTS): Secondary | ICD-10-CM | POA: Diagnosis not present

## 2022-08-11 DIAGNOSIS — J449 Chronic obstructive pulmonary disease, unspecified: Secondary | ICD-10-CM | POA: Diagnosis not present

## 2022-08-11 DIAGNOSIS — E538 Deficiency of other specified B group vitamins: Secondary | ICD-10-CM | POA: Diagnosis not present

## 2022-08-11 DIAGNOSIS — Z6827 Body mass index (BMI) 27.0-27.9, adult: Secondary | ICD-10-CM | POA: Diagnosis not present

## 2022-08-11 DIAGNOSIS — E663 Overweight: Secondary | ICD-10-CM | POA: Diagnosis not present

## 2022-08-11 DIAGNOSIS — D352 Benign neoplasm of pituitary gland: Secondary | ICD-10-CM | POA: Diagnosis not present

## 2022-08-11 DIAGNOSIS — Z9889 Other specified postprocedural states: Secondary | ICD-10-CM | POA: Diagnosis not present

## 2022-08-11 DIAGNOSIS — F329 Major depressive disorder, single episode, unspecified: Secondary | ICD-10-CM | POA: Diagnosis not present

## 2022-08-11 DIAGNOSIS — E782 Mixed hyperlipidemia: Secondary | ICD-10-CM | POA: Diagnosis not present

## 2022-10-11 DIAGNOSIS — E663 Overweight: Secondary | ICD-10-CM | POA: Diagnosis not present

## 2022-10-11 DIAGNOSIS — Z20828 Contact with and (suspected) exposure to other viral communicable diseases: Secondary | ICD-10-CM | POA: Diagnosis not present

## 2022-10-11 DIAGNOSIS — J069 Acute upper respiratory infection, unspecified: Secondary | ICD-10-CM | POA: Diagnosis not present

## 2022-10-11 DIAGNOSIS — Z6827 Body mass index (BMI) 27.0-27.9, adult: Secondary | ICD-10-CM | POA: Diagnosis not present

## 2022-10-21 ENCOUNTER — Encounter (HOSPITAL_COMMUNITY): Payer: Self-pay | Admitting: Emergency Medicine

## 2022-10-21 ENCOUNTER — Emergency Department (HOSPITAL_COMMUNITY): Payer: No Typology Code available for payment source

## 2022-10-21 ENCOUNTER — Other Ambulatory Visit: Payer: Self-pay

## 2022-10-21 ENCOUNTER — Emergency Department (HOSPITAL_COMMUNITY)
Admission: EM | Admit: 2022-10-21 | Discharge: 2022-10-21 | Disposition: A | Discharge status: Home / Self Care | Payer: No Typology Code available for payment source | Attending: Emergency Medicine | Admitting: Emergency Medicine

## 2022-10-21 DIAGNOSIS — R569 Unspecified convulsions: Secondary | ICD-10-CM | POA: Diagnosis not present

## 2022-10-21 DIAGNOSIS — R0602 Shortness of breath: Secondary | ICD-10-CM | POA: Diagnosis present

## 2022-10-21 DIAGNOSIS — U071 COVID-19: Secondary | ICD-10-CM | POA: Insufficient documentation

## 2022-10-21 DIAGNOSIS — G40909 Epilepsy, unspecified, not intractable, without status epilepticus: Secondary | ICD-10-CM | POA: Insufficient documentation

## 2022-10-21 DIAGNOSIS — R000 Tachycardia, unspecified: Secondary | ICD-10-CM | POA: Diagnosis not present

## 2022-10-21 LAB — CBC WITH DIFFERENTIAL/PLATELET
Abs Immature Granulocytes: 0.04 10*3/uL (ref 0.00–0.07)
Basophils Absolute: 0 10*3/uL (ref 0.0–0.1)
Basophils Relative: 1 %
Eosinophils Absolute: 0 10*3/uL (ref 0.0–0.5)
Eosinophils Relative: 0 %
HCT: 41.1 % (ref 39.0–52.0)
Hemoglobin: 13.8 g/dL (ref 13.0–17.0)
Immature Granulocytes: 1 %
Lymphocytes Relative: 17 %
Lymphs Abs: 1.1 10*3/uL (ref 0.7–4.0)
MCH: 30.4 pg (ref 26.0–34.0)
MCHC: 33.6 g/dL (ref 30.0–36.0)
MCV: 90.5 fL (ref 80.0–100.0)
Monocytes Absolute: 0.6 10*3/uL (ref 0.1–1.0)
Monocytes Relative: 9 %
Neutro Abs: 4.6 10*3/uL (ref 1.7–7.7)
Neutrophils Relative %: 72 %
Platelets: 258 10*3/uL (ref 150–400)
RBC: 4.54 MIL/uL (ref 4.22–5.81)
RDW: 12 % (ref 11.5–15.5)
WBC: 6.3 10*3/uL (ref 4.0–10.5)
nRBC: 0 % (ref 0.0–0.2)

## 2022-10-21 LAB — COMPREHENSIVE METABOLIC PANEL
ALT: 18 U/L (ref 0–44)
AST: 20 U/L (ref 15–41)
Albumin: 3.8 g/dL (ref 3.5–5.0)
Alkaline Phosphatase: 60 U/L (ref 38–126)
Anion gap: 9 (ref 5–15)
BUN: 9 mg/dL (ref 8–23)
CO2: 25 mmol/L (ref 22–32)
Calcium: 9 mg/dL (ref 8.9–10.3)
Chloride: 100 mmol/L (ref 98–111)
Creatinine, Ser: 1.53 mg/dL — ABNORMAL HIGH (ref 0.61–1.24)
GFR, Estimated: 49 mL/min — ABNORMAL LOW (ref >60)
Glucose, Bld: 116 mg/dL — ABNORMAL HIGH (ref 70–99)
Potassium: 3.4 mmol/L — ABNORMAL LOW (ref 3.5–5.1)
Sodium: 134 mmol/L — ABNORMAL LOW (ref 135–145)
Total Bilirubin: 0.7 mg/dL (ref 0.3–1.2)
Total Protein: 7 g/dL (ref 6.5–8.1)

## 2022-10-21 LAB — RESP PANEL BY RT-PCR (RSV, FLU A&B, COVID)  RVPGX2
Influenza A by PCR: NEGATIVE
Influenza B by PCR: NEGATIVE
Resp Syncytial Virus by PCR: NEGATIVE
SARS Coronavirus 2 by RT PCR: POSITIVE — AB

## 2022-10-21 LAB — PROTIME-INR
INR: 1.1 (ref 0.8–1.2)
Prothrombin Time: 14.2 seconds (ref 11.4–15.2)

## 2022-10-21 LAB — CBG MONITORING, ED: Glucose-Capillary: 143 mg/dL — ABNORMAL HIGH (ref 70–99)

## 2022-10-21 LAB — LACTIC ACID, PLASMA: Lactic Acid, Venous: 1.7 mmol/L (ref 0.5–1.9)

## 2022-10-21 MED ORDER — PAXLOVID (150/100) 10 X 150 MG & 10 X 100MG PO TBPK: 2.0000 | ORAL_TABLET | Freq: Two times a day (BID) | ORAL | 0 refills | Status: AC

## 2022-10-21 MED ORDER — SODIUM CHLORIDE 0.9 % IV BOLUS
1000.0000 mL | Freq: Once | INTRAVENOUS | Status: AC
  Administered 2022-10-21: 1000 mL via INTRAVENOUS

## 2022-10-21 NOTE — ED Provider Notes (Signed)
[place] EMERGENCY DEPARTMENT AT [facility] Provider Note   CSN: [id number] Arrival date & time: 10/21/22  4098     History  Chief Complaint  Patient presents with   Seizures    Bobby York is a 67 y.o. male.  HPI    This is a 67 year old male who presents with chills and concerns for not feeling well.  While he was being evaluated in triage he was witnessed to have a seizure by nursing.  Describe 1 to 2 minutes of unresponsiveness and staring off.  He was dizzy afterwards.  Does not remember the incident.  Patient states generally he started not feeling well last night.  He went to bed early around 530.  He has had chills without documented fevers.  He states at baseline he has shortness of breath but does not wear oxygen.  Last week he saw his primary doctor for cough and was given antibiotics.  He at that time was COVID-negative.  Wife does report history of seizures prior to brain surgery in January when he had a pituitary tumor removed. Home Medications Prior to Admission medications   Medication Sig Start Date End Date Taking? Authorizing Provider  ALPRAZolam Prudy Feeler) 1 MG tablet Take 1 mg by mouth 1 day or 1 dose. 09/22/22  Yes [provider]  benzonatate (TESSALON) 200 MG capsule Take 200 mg by mouth 3 (three) times daily as needed for cough.   Yes [provider]  famotidine (PEPCID) 20 MG tablet Take 1 tablet by mouth daily. 09/28/21  Yes [provider]  nirmatrelvir & ritonavir (PAXLOVID, 150/100,) 10 x 150 MG & 10 x 100MG  TBPK Take 2 tablets by mouth 2 (two) times daily for 5 days. 10/21/22 10/26/22 Yes Malgorzata Albert, Mayer Masker, MD  pantoprazole (PROTONIX) 40 MG tablet Take 40 mg by mouth daily.   Yes [provider]  traZODone (DESYREL) 50 MG tablet Take 25 mg by mouth at bedtime. 04/30/20  Yes [provider]  Vitamin D, Ergocalciferol, (DRISDOL) 1.25 MG (50000 UNIT) CAPS capsule Take 50,000 Units by mouth every 7 (seven)  days. 03/30/22  Yes [provider]  meclizine (ANTIVERT) 25 MG tablet Take 1 tablet by mouth every 6 (six) hours as needed. 06/11/22   [provider]  sodium chloride (OCEAN) 0.65 % SOLN nasal spray Place 2 sprays into both nostrils every 4 (four) hours while awake. Patient not taking: Reported on 06/21/2022 03/14/22   Lisbeth Renshaw, MD      Allergies    Patient has no known allergies.    Review of Systems   Review of Systems  Constitutional:  Positive for chills. Negative for fever.  Respiratory:  Positive for shortness of breath.   Cardiovascular:  Negative for chest pain.  Gastrointestinal:  Negative for nausea and vomiting.  Neurological:  Positive for seizures.  All other systems reviewed and are negative.   Physical Exam Updated Vital Signs BP 128/84   Pulse 89   Temp 98.2 F (36.8 C) (Oral)   Resp 20   Ht 1.803 m (5\' 11" )   Wt 89.8 kg   SpO2 97%   BMI 27.62 kg/m  Physical Exam Vitals and nursing note reviewed.  Constitutional:      Appearance: He is well-developed.  HENT:     Head: Normocephalic and atraumatic.  Eyes:     Pupils: Pupils are equal, round, and reactive to light.  Cardiovascular:     Rate and Rhythm: Regular rhythm. Tachycardia present.  Heart sounds: Normal heart sounds. No murmur heard. Pulmonary:     Effort: Pulmonary effort is normal. No respiratory distress.     Breath sounds: Normal breath sounds. No wheezing.     Comments: Cannula in place Abdominal:     General: Bowel sounds are normal.     Palpations: Abdomen is soft.     Tenderness: There is no abdominal tenderness. There is no rebound.  Musculoskeletal:     Cervical back: Neck supple.  Lymphadenopathy:     Cervical: No cervical adenopathy.  Skin:    General: Skin is warm and dry.  Neurological:     Mental Status: He is alert and oriented to person, place, and time.     Comments: Cranial nerves II through XII intact, 5 out of 5 strength in all 4  extremities  Psychiatric:        Mood and Affect: Mood normal.     ED Results / Procedures / Treatments   Labs (all labs ordered are listed, but only abnormal results are displayed) Labs Reviewed  RESP PANEL BY RT-PCR (RSV, FLU A&B, COVID)  RVPGX2 - Abnormal; Notable for the following components:      Result Value   SARS Coronavirus 2 by RT PCR POSITIVE (*)    All other components within normal limits  COMPREHENSIVE METABOLIC PANEL - Abnormal; Notable for the following components:   Sodium 134 (*)    Potassium 3.4 (*)    Glucose, Bld 116 (*)    Creatinine, Ser 1.53 (*)    GFR, Estimated 49 (*)    All other components within normal limits  CBG MONITORING, ED - Abnormal; Notable for the following components:   Glucose-Capillary 143 (*)    All other components within normal limits  CULTURE, BLOOD (ROUTINE X 2)  CULTURE, BLOOD (ROUTINE X 2)  LACTIC ACID, PLASMA  CBC WITH DIFFERENTIAL/PLATELET  PROTIME-INR  URINALYSIS, W/ REFLEX TO CULTURE (INFECTION SUSPECTED)    EKG None  Radiology CT Head Wo Contrast  Result Date: 10/21/2022 CLINICAL DATA:  67 year old male with fever, shortness of breath and seizure. EXAM: CT HEAD WITHOUT CONTRAST TECHNIQUE: Contiguous axial images were obtained from the base of the skull through the vertex without intravenous contrast. RADIATION DOSE REDUCTION: This exam was performed according to the departmental dose-optimization program which includes automated exposure control, adjustment of the mA and/or kV according to patient size and/or use of iterative reconstruction technique. COMPARISON:  Brain MRI 12/03/2021.  Report of head CT 04/14/2016. FINDINGS: Brain: Evidence that the intra sellar pituitary mass demonstrated on brain MRI last year has been resected. Mild postoperative changes to the sella turcica. No suprasellar mass or mass effect. Stable cerebral volume. No ventriculomegaly. No acute intracranial hemorrhage identified. Stable and normal  gray-white differentiation. No cerebral edema, acute cortically based infarct identified. Vascular: No suspicious intracranial vascular hyperdensity. Skull: No acute osseous abnormality identified. Subtle chronic right lamina papyracea fracture. Sinuses/Orbits: Chronic left sphenoid sinus fluid. Other Visualized paranasal sinuses and mastoids are stable and well aerated. Other: Visualized orbits and scalp soft tissues are within normal limits. IMPRESSION: 1. No acute intracranial abnormality. Suggestion of chronic pituitary adenoma resection since the 2023 MRI. No adverse postoperative features by CT. 2. Chronic left sphenoid sinus inflammation. Electronically Signed   By: Odessa Fleming M.D.   On: 10/21/2022 04:49   DG Chest Port 1 View  Result Date: 10/21/2022 CLINICAL DATA:  67 year old male with fever, shortness of breath, seizure, possible sepsis. EXAM: PORTABLE CHEST 1 VIEW  COMPARISON:  Chest radiograph 08/27/2021 and earlier. FINDINGS: Portable AP upright view at 0350 hours. Mild chronic elevation of the left hemidiaphragm is stable. Normal cardiac size and mediastinal contours. Visualized tracheal air column is within normal limits. Allowing for portable technique the lungs are clear. No pneumothorax or pleural effusion. Chronic right posterior 3rd and 4th rib fractures. No acute osseous abnormality identified. Negative visible bowel gas. IMPRESSION: No acute cardiopulmonary abnormality. Electronically Signed   By: Odessa Fleming M.D.   On: 10/21/2022 04:04    Procedures Procedures    Medications Ordered in ED Medications  sodium chloride 0.9 % bolus 1,000 mL (1,000 mLs Intravenous New Bag/Given 10/21/22 0506)    ED Course/ Medical Decision Making/ A&P                                 Medical Decision Making Amount and/or Complexity of Data Reviewed Labs: ordered. Radiology: ordered.  Risk Prescription drug management.   This patient presents to the ED for concern of generalized weakness,  seizure-like activity, this involves York extensive number of treatment options, and is a complaint that carries with it a high risk of complications and morbidity.  I considered the following differential and admission for this acute, potentially life threatening condition.  The differential diagnosis includes systemic illness such as virus, metabolic derangement, intracranial abnormality, seizure, syncope  MDM:    This is a 67 year old male who presents with generalized weakness.  Had York episode in triage that appeared seizure-like to nursing.  He is awake and alert on my evaluation.  Questionable history of seizures prior to tumor removal but has never been on medications.  I do not see any seizure history in his chart.  He is nonfocal.  Labs obtained and reviewed.  Labs are notable for mild AKI.  He was given fluids.  COVID-positive.  This is likely the etiology of his generalized weakness.  Chest x-ray shows no evidence of pneumonia.  CT imaging is largely reassuring.  Patient remained clinically stable.  He was offered Paxlovid.  At this time would not start on seizure medications.  However, given his history and questionable prior seizure history, will refer to neurology.   (Labs, imaging, consults)  Labs: I Ordered, and personally interpreted labs.  The pertinent results include: CBC, CMP, COVID lactate, blood cultures  Imaging Studies ordered: I ordered imaging studies including CT head, x-ray chest I independently visualized and interpreted imaging. I agree with the radiologist interpretation  Additional history obtained from wife at bedside.  External records from outside source obtained and reviewed including prior evaluations  Cardiac Monitoring: The patient was maintained on a cardiac monitor.  If on the cardiac monitor, I personally viewed and interpreted the cardiac monitored which showed York underlying rhythm of: Sinus rhythm  Reevaluation: After the interventions noted above, I  reevaluated the patient and found that they have :improved  Social Determinants of Health:  lives independently  Disposition: Discharge  Co morbidities that complicate the patient evaluation  Past Medical History:  Diagnosis Date   Bipolar 1 disorder (HCC)    diagnosed 2004   Dyspnea    Hepatitis    Hep C   Substance abuse (HCC) 2021     Medicines Meds ordered this encounter  Medications   sodium chloride 0.9 % bolus 1,000 mL   nirmatrelvir & ritonavir (PAXLOVID, 150/100,) 10 x 150 MG & 10 x 100MG  TBPK  Sig: Take 2 tablets by mouth 2 (two) times daily for 5 days.    Dispense:  20 tablet    Refill:  0    I have reviewed the patients home medicines and have made adjustments as needed  Problem List / ED Course: Problem List Items Addressed This Visit   None Visit Diagnoses     COVID-19    -  Primary   Relevant Medications   nirmatrelvir & ritonavir (PAXLOVID, 150/100,) 10 x 150 MG & 10 x 100MG  TBPK   Seizure-like activity (HCC)       Relevant Orders   Ambulatory referral to Neurology                   Final Clinical Impression(s) / ED Diagnoses Final diagnoses:  COVID-19  Seizure-like activity (HCC)    Rx / DC Orders ED Discharge Orders          Ordered    Ambulatory referral to Neurology       Comments: York appointment is requested in approximately: 2 weeks   10/21/22 0608    nirmatrelvir & ritonavir (PAXLOVID, 150/100,) 10 x 150 MG & 10 x 100MG  TBPK  2 times daily        10/21/22 7829              Shon Baton, MD 10/21/22 [id number]

## 2022-10-21 NOTE — Discharge Instructions (Addendum)
You are seen today for feeling weak.  You were found to have COVID-19.  You did have a seizure-like episode while in the emergency department witnessed by nursing.  Follow-up with neurology as an outpatient.  You may need an outpatient EEG and/or medications.  Sure that you are staying hydrated.

## 2022-10-21 NOTE — ED Triage Notes (Addendum)
Pt to ed pov, pt c/o fever and sob that started tonight. Pt took tylenol 1 hour ago. During registering pt, pt went into a seizure that consisted of staring off, not responsive to sternal rubs, lasting 1-2 minutes. Seizure was witnessed by staff and spouse. Pt c/o of dizziness post seizure. Pt a&o at this time

## 2022-10-26 LAB — CULTURE, BLOOD (ROUTINE X 2)
Culture: NO GROWTH
Culture: NO GROWTH
Special Requests: ADEQUATE
Special Requests: ADEQUATE

## 2022-11-01 DIAGNOSIS — E663 Overweight: Secondary | ICD-10-CM | POA: Diagnosis not present

## 2022-11-01 DIAGNOSIS — Z6826 Body mass index (BMI) 26.0-26.9, adult: Secondary | ICD-10-CM | POA: Diagnosis not present

## 2022-11-01 DIAGNOSIS — G90A Postural orthostatic tachycardia syndrome (POTS): Secondary | ICD-10-CM | POA: Diagnosis not present

## 2022-11-01 DIAGNOSIS — U099 Post covid-19 condition, unspecified: Secondary | ICD-10-CM | POA: Diagnosis not present

## 2022-11-04 ENCOUNTER — Telehealth: Payer: Self-pay

## 2022-11-04 NOTE — Telephone Encounter (Signed)
Transition Care Management Follow-up Telephone Call Date of discharge and from where: 10/21/2022 [facility] How have you been since you were released from the hospital? Patient stated he is beginning to feel better but  he still has no energy and is SOB. Any questions or concerns? No  Items Reviewed: Did the pt receive and understand the discharge instructions provided? Yes  Medications obtained and verified? Yes  Other? No  Any new allergies since your discharge? No  Dietary orders reviewed? Yes Do you have support at home? Yes   Follow up appointments reviewed:  PCP Hospital f/u appt confirmed? No  Scheduled to see  on  @ . Specialist Hospital f/u appt confirmed? No  Scheduled to see  on  @ . Are transportation arrangements needed? No  If their condition worsens, is the pt aware to call PCP or go to the Emergency Dept.? Yes Was the patient provided with contact information for the PCP's office or ED? Yes Was to pt encouraged to call back with questions or concerns? Yes  Dareld Mcauliffe [facility]  [facility], [facility] Guide Direct Dial: [phone]  Website: [email or web address]

## 2022-12-17 DIAGNOSIS — E663 Overweight: Secondary | ICD-10-CM | POA: Diagnosis not present

## 2022-12-17 DIAGNOSIS — F419 Anxiety disorder, unspecified: Secondary | ICD-10-CM | POA: Diagnosis not present

## 2022-12-17 DIAGNOSIS — Z6826 Body mass index (BMI) 26.0-26.9, adult: Secondary | ICD-10-CM | POA: Diagnosis not present

## 2022-12-23 DIAGNOSIS — Z9889 Other specified postprocedural states: Secondary | ICD-10-CM | POA: Diagnosis not present

## 2022-12-23 DIAGNOSIS — E893 Postprocedural hypopituitarism: Secondary | ICD-10-CM | POA: Diagnosis not present

## 2022-12-24 LAB — COMPREHENSIVE METABOLIC PANEL
ALT: 10 [IU]/L (ref 0–44)
AST: 20 [IU]/L (ref 0–40)
Albumin: 4.1 g/dL (ref 3.9–4.9)
Alkaline Phosphatase: 77 [IU]/L (ref 44–121)
BUN/Creatinine Ratio: 8 — ABNORMAL LOW (ref 10–24)
BUN: 9 mg/dL (ref 8–27)
Bilirubin Total: 0.5 mg/dL (ref 0.0–1.2)
CO2: 24 mmol/L (ref 20–29)
Calcium: 9.2 mg/dL (ref 8.6–10.2)
Chloride: 103 mmol/L (ref 96–106)
Creatinine, Ser: 1.19 mg/dL (ref 0.76–1.27)
Globulin, Total: 2.7 g/dL (ref 1.5–4.5)
Glucose: 110 mg/dL — ABNORMAL HIGH (ref 70–99)
Potassium: 3.8 mmol/L (ref 3.5–5.2)
Sodium: 142 mmol/L (ref 134–144)
Total Protein: 6.8 g/dL (ref 6.0–8.5)
eGFR: 67 mL/min/{1.73_m2} (ref >59)

## 2022-12-24 LAB — CORTISOL-AM, BLOOD: Cortisol - AM: 9.3 ug/dL (ref 6.2–19.4)

## 2022-12-24 LAB — T4, FREE: Free T4: 0.93 ng/dL (ref 0.82–1.77)

## 2022-12-24 LAB — TSH: TSH: 1.68 u[IU]/mL (ref 0.450–4.500)

## 2022-12-24 LAB — PROLACTIN: Prolactin: 13.8 ng/mL (ref 3.6–25.2)

## 2022-12-30 ENCOUNTER — Encounter: Payer: Self-pay | Admitting: "Endocrinology

## 2022-12-30 ENCOUNTER — Ambulatory Visit (INDEPENDENT_AMBULATORY_CARE_PROVIDER_SITE_OTHER): Payer: No Typology Code available for payment source | Admitting: "Endocrinology

## 2022-12-30 VITALS — BP 88/64 | HR 84 | Ht 71.0 in | Wt 197.4 lb

## 2022-12-30 DIAGNOSIS — Z9889 Other specified postprocedural states: Secondary | ICD-10-CM

## 2022-12-30 DIAGNOSIS — E893 Postprocedural hypopituitarism: Secondary | ICD-10-CM | POA: Diagnosis not present

## 2022-12-30 NOTE — Progress Notes (Signed)
12/30/2022, 1:48 PM  Endocrinology follow-up note   Subjective:    Patient ID: Bobby York, male    DOB: 26-Apr-1955, PCP Assunta Found, MD   Past Medical History:  Diagnosis Date   Bipolar 1 disorder [facility])    diagnosed 2004   Dyspnea    Hepatitis    Hep C   Substance abuse (HCC) 2021   Past Surgical History:  Procedure Laterality Date   BACK SURGERY     two-lumbar disc x2   CLAVICLE SURGERY     COLONOSCOPY WITH PROPOFOL N/A 07/25/2014   ZOX:[id number] rectal polyp otherwise normal . hyperplastic polyp. TCS in 07/2024   CRANIOTOMY N/A 03/12/2022   Procedure: ENDOSCOPIC ENDONASAL TRANSPHENOIDAL RESECTION OF PITUITARY MASS;  Surgeon: Bedelia Person, MD;  Location: [facility] OR;  Service: Neurosurgery;  Laterality: N/A;   ESOPHAGEAL DILATION N/A 07/25/2014   Procedure: ESOPHAGEAL DILATION;  Surgeon: Corbin Ade, MD;  Location: [facility] ORS;  Service: Endoscopy;  Laterality: N/A;  Malony dilators # 56,   ESOPHAGOGASTRODUODENOSCOPY (EGD) WITH PROPOFOL N/A 07/25/2014   WUJ:WJXB erosive reflux/s/p dilator/HH. +hpylor    NECK SURGERY     TRANSPHENOIDAL APPROACH EXPOSURE N/A 03/12/2022   Procedure: TRANSPHENOIDAL ENDOSCOPIC RESCTION OF PITUITARY ADENOMA;  Surgeon: Laren Boom, DO;  Location: [facility] OR;  Service: ENT;  Laterality: N/A;   Social History   Socioeconomic History   Marital status: Married    Spouse name: Not on file   Number of children: Not on file   Years of education: Not on file   Highest education level: Not on file  Occupational History   Not on file  Tobacco Use   Smoking status: Former    Current packs/day: 0.00    Average packs/day: 0.4 packs/day for 50.8 years (20.3 ttl pk-yrs)    Types: Cigarettes    Start date: 05/28/1970    Quit date: 03/2021    Years since quitting: 1.7   Smokeless tobacco: Never   Tobacco comments:    Pt states he stopped smoking about 4-5 months ago // mr CMA 08/27/2021   Vaping Use    Vaping status: Never Used  Substance and Sexual Activity   Alcohol use: Not Currently    Comment: AS of today, last use in 2021   Drug use: Not Currently    Types: Marijuana    Comment: AS of today, last use in 2021   Sexual activity: Yes    Birth control/protection: None  Other Topics Concern   Not on file  Social History Narrative   Not on file   Social Determinants of Health   Financial Resource Strain: Not on file  Food Insecurity: Not on file  Transportation Needs: Not on file  Physical Activity: Not on file  Stress: Not on file  Social Connections: Unknown (07/07/2021)   [facility]   Social Network    Social Network: Not on file   Family History  Problem Relation Age of Onset   Heart disease Mother    Diabetes Mother    Prostate cancer Father    Liver disease Neg Hx    Colon cancer Neg Hx    Outpatient Encounter Medications as of 12/30/2022  Medication Sig   ALPRAZolam (XANAX) 1 MG tablet Take 1 mg by mouth 1 day or 1 dose.   benzonatate (TESSALON) 200 MG capsule Take 200 mg by mouth 3 (three) times daily as needed for cough.   famotidine (PEPCID) 20 MG tablet Take 1 tablet by mouth daily.   meclizine (ANTIVERT) 25 MG tablet Take 1 tablet by mouth every 6 (six) hours as needed.   pantoprazole (PROTONIX) 40 MG tablet Take 40 mg by mouth daily.   sodium chloride (OCEAN) 0.65 % SOLN nasal spray Place 2 sprays into both nostrils every 4 (four) hours while awake. (Patient not taking: Reported on 06/21/2022)   traZODone (DESYREL) 50 MG tablet Take 25 mg by mouth at bedtime.   Vitamin D, Ergocalciferol, (DRISDOL) 1.25 MG (50000 UNIT) CAPS capsule Take 50,000 Units by mouth every 7 (seven) days.   No facility-administered encounter medications on file as of 12/30/2022.   ALLERGIES: No Known Allergies  VACCINATION STATUS: Immunization History  Administered Date(s) Administered   Influenza,inj,Quad PF,6+ Mos 04/18/2016    Influenza-Unspecified 11/23/2019, 11/29/2020   Moderna Covid-19 Vaccine Bivalent Booster 18yrs & up 12/06/2020   Moderna Sars-Covid-2 Vaccination 05/03/2019, 06/02/2019, 12/14/2019   Pneumococcal Polysaccharide-23 04/18/2016, 04/27/2018   Td 04/27/2018   Td (Adult) 04/27/2018   Tdap 01/08/1996   Zoster Recombinant(Shingrix) 04/28/2020, 10/28/2020    HPI Bobby York is 67 y.o. male who presents today with a medical history as above. he is being seen in follow-up after he was seen in consultation for history of pituitary adenoma status postresection requested by Assunta Found, MD. Patient is not an optimal historian, accompanied by his wife to clinic.  History is obtained from the family as well as chart review.  According to review of his records, he underwent transsphenoidal resection of pituitary adenoma on March 12, 2022 after presurgical workup.  His initial endocrine assessment records are not available to review.  He is not currently on any hormone replacement therapy. This is his second visit with normal endocrine assessment based on his labs-see labs below.    He denies headaches nor blurry vision or visual field deficits at this time. He has some baseline right-sided tremor and disequilibrium on and off. His other medical problems include diabetes type 2, high blood pressure and high cholesterol.  He also has CHF.  His on trazodone, meclizine, famotidine and vitamin D. His surgical pathology review showed pituitary adenoma with gross description of 2 x 1.7 x 0.3 cm specimen. He does not have postsurgical workup for thyroid function, adrenal assessment.  Review of Systems  Constitutional: + Mildly fluctuating body weight, + fatigue, no subjective hyperthermia, no subjective hypothermia   Objective:       12/30/2022   10:03 AM 10/21/2022    6:00 AM 10/21/2022    5:00 AM  Vitals with BMI  Height 5\' 11"     Weight 197 lbs 6 oz    BMI 27.54    Systolic 88 169 128   Diastolic 64 98 84  Pulse 84 82 89    BP (!) 88/64   Pulse 84   Ht 5\' 11"  (1.803 m)   Wt 197 lb 6.4 oz (89.5 kg)   BMI 27.53 kg/m   Wt Readings from Last 3 Encounters:  12/30/22 197 lb 6.4 oz (89.5 kg)  10/21/22 198 lb (89.8 kg)  06/28/22 190 lb (86.2 kg)    Physical Exam  Constitutional:  Body mass index is 27.53 kg/m.,  not in acute distress, normal  state of mind Eyes: PERRLA, EOMI, no exophthalmos ENT: moist mucous membranes, no gross thyromegaly, no gross cervical lymphadenopathy  Skin: moist, warm, no rashes Neurological: + tremor of right upper extremity at rest,  Deep tendon reflexes normal in bilateral lower extremities.  CMP ( most recent) CMP     Component Value Date/Time   NA 142 12/23/2022 0807   K 3.8 12/23/2022 0807   CL 103 12/23/2022 0807   CO2 24 12/23/2022 0807   GLUCOSE 110 (H) 12/23/2022 0807   GLUCOSE 116 (H) 10/21/2022 0338   BUN 9 12/23/2022 0807   CREATININE 1.19 12/23/2022 0807   CREATININE 1.06 06/02/2015 0921   CALCIUM 9.2 12/23/2022 0807   PROT 6.8 12/23/2022 0807   ALBUMIN 4.1 12/23/2022 0807   AST 20 12/23/2022 0807   ALT 10 12/23/2022 0807   ALKPHOS 77 12/23/2022 0807   BILITOT 0.5 12/23/2022 0807   GFRNONAA 49 (L) 10/21/2022 0338   GFRAA >60 11/22/2019 1221     Lab Results  Component Value Date   TSH 1.680 12/23/2022   TSH 1.470 06/22/2022   TSH 0.409 03/13/2022   TSH 1.298 12/11/2021   TSH 1.02 09/28/2021   FREET4 0.93 12/23/2022   FREET4 0.96 06/22/2022   FREET4 0.72 12/11/2021    Recent Results (from the past 2160 hour(s))  Comprehensive metabolic panel     Status: Abnormal   Collection Time: 10/21/22  3:38 AM  Result Value Ref Range   Sodium 134 (L) 135 - 145 mmol/L   Potassium 3.4 (L) 3.5 - 5.1 mmol/L   Chloride 100 98 - 111 mmol/L   CO2 25 22 - 32 mmol/L   Glucose, Bld 116 (H) 70 - 99 mg/dL    Comment: Glucose reference range applies only to samples taken after fasting for at least 8 hours.   BUN 9 8 - 23  mg/dL   Creatinine, Ser 2.72 (H) 0.61 - 1.24 mg/dL   Calcium 9.0 8.9 - 53.6 mg/dL   Total Protein 7.0 6.5 - 8.1 g/dL   Albumin 3.8 3.5 - 5.0 g/dL   AST 20 15 - 41 U/L   ALT 18 0 - 44 U/L   Alkaline Phosphatase 60 38 - 126 U/L   Total Bilirubin 0.7 0.3 - 1.2 mg/dL   GFR, Estimated 49 (L) >60 mL/min    Comment: (NOTE) Calculated using the CKD-EPI Creatinine Equation (2021)    Anion gap 9 5 - 15    Comment: Performed at [facility], [address], [place], [place] [address]  CBC with Differential     Status: None   Collection Time: 10/21/22  3:38 AM  Result Value Ref Range   WBC 6.3 4.0 - 10.5 K/uL   RBC 4.54 4.22 - 5.81 MIL/uL   Hemoglobin 13.8 13.0 - 17.0 g/dL   HCT 47.4 25.9 - 56.3 %   MCV 90.5 80.0 - 100.0 fL   MCH 30.4 26.0 - 34.0 pg   MCHC 33.6 30.0 - 36.0 g/dL   RDW 87.5 64.3 - 32.9 %   Platelets 258 150 - 400 K/uL   nRBC 0.0 0.0 - 0.2 %   Neutrophils Relative % 72 %   Neutro Abs 4.6 1.7 - 7.7 K/uL   Lymphocytes Relative 17 %   Lymphs Abs 1.1 0.7 - 4.0 K/uL   Monocytes Relative 9 %   Monocytes Absolute 0.6 0.1 - 1.0 K/uL   Eosinophils Relative 0 %   Eosinophils Absolute 0.0 0.0 - 0.5 K/uL  Basophils Relative 1 %   Basophils Absolute 0.0 0.0 - 0.1 K/uL   Immature Granulocytes 1 %   Abs Immature Granulocytes 0.04 0.00 - 0.07 K/uL    Comment: Performed at [facility], [address] [place], [place] [address]  Protime-INR     Status: None   Collection Time: 10/21/22  3:38 AM  Result Value Ref Range   Prothrombin Time 14.2 11.4 - 15.2 seconds   INR 1.1 0.8 - 1.2    Comment: (NOTE) INR goal varies based on device and disease states. Performed at [facility], [address], [place], [place] [address]   POC CBG, ED     Status: Abnormal   Collection Time: 10/21/22  3:46 AM  Result Value Ref Range   Glucose-Capillary 143 (H) 70 - 99 mg/dL    Comment: Glucose reference range applies only to samples taken after fasting for at least 8 hours.  Lactic acid, plasma      Status: None   Collection Time: 10/21/22  3:48 AM  Result Value Ref Range   Lactic Acid, Venous 1.7 0.5 - 1.9 mmol/L    Comment: Performed at [facility], [address] [place], [place] [address]  Resp panel by RT-PCR (RSV, Flu A&B, Covid) Anterior Nasal Swab     Status: Abnormal   Collection Time: 10/21/22  3:48 AM   Specimen: Anterior Nasal Swab  Result Value Ref Range   SARS Coronavirus 2 by RT PCR POSITIVE (A) NEGATIVE    Comment: (NOTE) SARS-CoV-2 target nucleic acids are DETECTED.  The SARS-CoV-2 RNA is generally detectable in upper respiratory specimens during the acute phase of infection. Positive results are indicative of the presence of the identified virus, but do not rule out bacterial infection or co-infection with other pathogens not detected by the test. Clinical correlation with patient history and other diagnostic information is necessary to determine patient infection status. The expected result is Negative.  Fact Sheet for Patients: BloggerCourse.com  Fact Sheet for Healthcare Providers: SeriousBroker.it  This test is not yet approved or cleared by the [place] FDA and  has been authorized for detection and/or diagnosis of SARS-CoV-2 by FDA under an Emergency Use Authorization (EUA).  This EUA will remain in effect (meaning this test can be used) for the duration of  the COVID-19 declaration under Section 564(b)(1) of the A ct, 21 U.S.C. section 360bbb-3(b)(1), unless the authorization is terminated or revoked sooner.     Influenza A by PCR NEGATIVE NEGATIVE   Influenza B by PCR NEGATIVE NEGATIVE    Comment: (NOTE) The Xpert Xpress SARS-CoV-2/FLU/RSV plus assay is intended as an aid in the diagnosis of influenza from Nasopharyngeal swab specimens and should not be used as a sole basis for treatment. Nasal washings and aspirates are unacceptable for Xpert Xpress SARS-CoV-2/FLU/RSV testing.  Fact  Sheet for Patients: BloggerCourse.com  Fact Sheet for Healthcare Providers: SeriousBroker.it  This test is not yet approved or cleared by the [place] FDA and has been authorized for detection and/or diagnosis of SARS-CoV-2 by FDA under an Emergency Use Authorization (EUA). This EUA will remain in effect (meaning this test can be used) for the duration of the COVID-19 declaration under Section 564(b)(1) of the Act, 21 U.S.C. section 360bbb-3(b)(1), unless the authorization is terminated or revoked.     Resp Syncytial Virus by PCR NEGATIVE NEGATIVE    Comment: (NOTE) Fact Sheet for Patients: BloggerCourse.com  Fact Sheet for Healthcare Providers: SeriousBroker.it  This test is not yet approved  or cleared by the [place] and has been authorized for detection and/or diagnosis of SARS-CoV-2 by FDA under an Emergency Use Authorization (EUA). This EUA will remain in effect (meaning this test can be used) for the duration of the COVID-19 declaration under Section 564(b)(1) of the Act, 21 U.S.C. section 360bbb-3(b)(1), unless the authorization is terminated or revoked.  Performed at [facility], [address], [place], [place] [address]   Culture, blood (Routine x 2)     Status: None   Collection Time: 10/21/22  4:20 AM   Specimen: BLOOD  Result Value Ref Range   Specimen Description BLOOD BLOOD LEFT ARM    Special Requests      Blood Culture adequate volume BOTTLES DRAWN AEROBIC AND ANAEROBIC   Culture      NO GROWTH 5 DAYS Performed at [facility], [address] [place], [place] [address]    Report Status 10/26/2022 FINAL   Culture, blood (Routine x 2)     Status: None   Collection Time: 10/21/22  4:22 AM   Specimen: BLOOD  Result Value Ref Range   Specimen Description BLOOD BLOOD LEFT HAND    Special Requests      Blood Culture adequate volume BOTTLES DRAWN  AEROBIC ONLY   Culture      NO GROWTH 5 DAYS Performed at [facility], [address] [place], [place] [address]    Report Status 10/26/2022 FINAL   TSH     Status: None   Collection Time: 12/23/22  8:07 AM  Result Value Ref Range   TSH 1.680 0.450 - 4.500 uIU/mL  T4, free     Status: None   Collection Time: 12/23/22  8:07 AM  Result Value Ref Range   Free T4 0.93 0.82 - 1.77 ng/dL  Prolactin     Status: None   Collection Time: 12/23/22  8:07 AM  Result Value Ref Range   Prolactin 13.8 3.6 - 25.2 ng/mL  Cortisol-am, blood     Status: None   Collection Time: 12/23/22  8:07 AM  Result Value Ref Range   Cortisol - AM 9.3 6.2 - 19.4 ug/dL  Comprehensive metabolic panel     Status: Abnormal   Collection Time: 12/23/22  8:07 AM  Result Value Ref Range   Glucose 110 (H) 70 - 99 mg/dL   BUN 9 8 - 27 mg/dL   Creatinine, Ser 2.13 0.76 - 1.27 mg/dL   eGFR 67 >08 MV/HQI/6.96   BUN/Creatinine Ratio 8 (L) 10 - 24   Sodium 142 134 - 144 mmol/L   Potassium 3.8 3.5 - 5.2 mmol/L   Chloride 103 96 - 106 mmol/L   CO2 24 20 - 29 mmol/L   Calcium 9.2 8.6 - 10.2 mg/dL   Total Protein 6.8 6.0 - 8.5 g/dL   Albumin 4.1 3.9 - 4.9 g/dL   Globulin, Total 2.7 1.5 - 4.5 g/dL   Bilirubin Total 0.5 0.0 - 1.2 mg/dL   Alkaline Phosphatase 77 44 - 121 IU/L   AST 20 0 - 40 IU/L   ALT 10 0 - 44 IU/L     Assessment & Plan:   1. H/O partial hypophysectomy (HCC)  - I have reviewed his new and available  records and clinically evaluated the patient. - Based on these reviews, he has history of pituitary adenoma status post transsphenoidal resection on March 12, 2022.  His previsit labs show absence of any endocrine deficit.  He will not need hormone replacement therapy  at this time.    He will have repeat assessment of some of his labs in 12 months with office visit.  His prolactin is normal, no need for pituitary/sella imaging at this time. - I did not initiate any new prescriptions today.  - he is  advised to maintain close follow up with Assunta Found, MD for primary care needs.  I spent  22  minutes in the care of the patient today including review of labs from Thyroid Function, CMP, and other relevant labs ; imaging/biopsy records (current and previous including abstractions from other facilities); face-to-face time discussing  his lab results and symptoms, medications doses, his options of short and long term treatment based on the latest standards of care / guidelines;   and documenting the encounter.  Norina Buzzard  participated in the discussions, expressed understanding, and voiced agreement with the above plans.  All questions were answered to his satisfaction. he is encouraged to contact clinic should he have any questions or concerns prior to his return visit.    Follow up plan: Return in about 1 year (around 12/30/2023) for Fasting Labs  in AM B4 8.   Marquis Lunch, MD [facility] [address] [place], [place] [address] Phone: [phone]  Fax: [phone]     12/30/2022, 1:48 PM  This note was partially dictated with voice recognition software. Similar sounding words can be transcribed inadequately or may not  be corrected upon review.

## 2023-01-07 ENCOUNTER — Encounter: Payer: Self-pay | Admitting: Neurology

## 2023-01-07 ENCOUNTER — Ambulatory Visit (INDEPENDENT_AMBULATORY_CARE_PROVIDER_SITE_OTHER): Payer: No Typology Code available for payment source | Admitting: Neurology

## 2023-01-07 VITALS — BP 102/70 | HR 90 | Ht 71.0 in | Wt 197.0 lb

## 2023-01-07 DIAGNOSIS — R269 Unspecified abnormalities of gait and mobility: Secondary | ICD-10-CM

## 2023-01-07 DIAGNOSIS — G20B1 Parkinson's disease with dyskinesia, without mention of fluctuations: Secondary | ICD-10-CM | POA: Diagnosis not present

## 2023-01-07 DIAGNOSIS — F419 Anxiety disorder, unspecified: Secondary | ICD-10-CM | POA: Diagnosis not present

## 2023-01-07 MED ORDER — CITALOPRAM HYDROBROMIDE 10 MG PO TABS: 10.0000 mg | ORAL_TABLET | Freq: Every day | ORAL | 3 refills | Status: DC

## 2023-01-07 MED ORDER — CARBIDOPA-LEVODOPA 25-100 MG PO TABS: 1.0000 | ORAL_TABLET | Freq: Three times a day (TID) | ORAL | 0 refills | Status: DC

## 2023-01-07 NOTE — Patient Instructions (Signed)
Start Sinemet 25/100 twice daily for 2 weeks then increase to 3 times daily  Discontinue Xanax and start Celexa 10 mg daily  Referral to physical therapy for gait abnormality  Continue your other medications  Return in 3 months or sooner if worse
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
## 2023-01-27 ENCOUNTER — Ambulatory Visit (HOSPITAL_COMMUNITY): Payer: No Typology Code available for payment source | Attending: Neurology

## 2023-01-27 DIAGNOSIS — M6281 Muscle weakness (generalized): Secondary | ICD-10-CM | POA: Insufficient documentation

## 2023-01-27 DIAGNOSIS — R262 Difficulty in walking, not elsewhere classified: Secondary | ICD-10-CM | POA: Insufficient documentation

## 2023-01-27 DIAGNOSIS — G20B1 Parkinson's disease with dyskinesia, without mention of fluctuations: Secondary | ICD-10-CM | POA: Diagnosis not present

## 2023-01-27 DIAGNOSIS — Z7409 Other reduced mobility: Secondary | ICD-10-CM | POA: Insufficient documentation

## 2023-01-27 DIAGNOSIS — R6889 Other general symptoms and signs: Secondary | ICD-10-CM | POA: Insufficient documentation

## 2023-01-27 DIAGNOSIS — R531 Weakness: Secondary | ICD-10-CM | POA: Diagnosis present

## 2023-01-27 DIAGNOSIS — I951 Orthostatic hypotension: Secondary | ICD-10-CM | POA: Diagnosis not present

## 2023-01-27 DIAGNOSIS — R2681 Unsteadiness on feet: Secondary | ICD-10-CM | POA: Insufficient documentation

## 2023-01-27 DIAGNOSIS — R269 Unspecified abnormalities of gait and mobility: Secondary | ICD-10-CM | POA: Diagnosis not present

## 2023-01-27 NOTE — Therapy (Signed)
[facility] OUTPATIENT PHYSICAL THERAPY NEURO EVALUATION   Patient Name: Bobby York MRN: [id number] DOB:25-Sep-1955, 67 y.o., male Today's Date: 01/27/2023   PCP: Assunta Found, MDRef Provider (PCP)  REFERRING PROVIDER:   Windell Norfolk, MD    END OF SESSION:   PT End of Session - 01/27/23 0934     Visit Number 1    Number of Visits 16    Authorization Type [employer] [place] [employer] secondary    PT Start Time 0930    PT Stop Time 1010    PT Time Calculation (min) 40 min    Activity Tolerance Patient tolerated treatment well;Patient limited by fatigue             Past Medical History:  Diagnosis Date   Bipolar 1 disorder (HCC)    diagnosed 2004   Dyspnea    Hepatitis    Hep C   Substance abuse (HCC) 2021   Past Surgical History:  Procedure Laterality Date   BACK SURGERY     two-lumbar disc x2   CLAVICLE SURGERY     COLONOSCOPY WITH PROPOFOL N/A 07/25/2014   YQM:VHQION rectal polyp otherwise normal . hyperplastic polyp. TCS in 07/2024   CRANIOTOMY N/A 03/12/2022   Procedure: ENDOSCOPIC ENDONASAL TRANSPHENOIDAL RESECTION OF PITUITARY MASS;  Surgeon: Bedelia Person, MD;  Location: [facility] OR;  Service: Neurosurgery;  Laterality: N/A;   ESOPHAGEAL DILATION N/A 07/25/2014   Procedure: ESOPHAGEAL DILATION;  Surgeon: Corbin Ade, MD;  Location: [facility] ORS;  Service: Endoscopy;  Laterality: N/A;  Malony dilators # 56,   ESOPHAGOGASTRODUODENOSCOPY (EGD) WITH PROPOFOL N/A 07/25/2014   GEX:BMWU erosive reflux/s/p dilator/HH. +hpylor    NECK SURGERY     TRANSPHENOIDAL APPROACH EXPOSURE N/A 03/12/2022   Procedure: TRANSPHENOIDAL ENDOSCOPIC RESCTION OF PITUITARY ADENOMA;  Surgeon: Laren Boom, DO;  Location: [facility] OR;  Service: ENT;  Laterality: N/A;   Patient Active Problem List   Diagnosis Date Noted   H/O partial hypophysectomy (HCC) 06/21/2022   Pituitary mass (HCC) 03/12/2022   Pituitary tumor 03/12/2022   DOE (dyspnea on exertion) 08/27/2021   Pneumothorax, traumatic 04/14/2016    H. pylori infection 08/22/2014   Reflux esophagitis    Dysphagia, pharyngoesophageal phase    Mucosal abnormality of stomach    History of colonic polyps    Chronic hepatitis C (HCC) 07/09/2014   Abnormal weight loss 07/09/2014   History of ETOH abuse 07/09/2014   Esophageal dysphagia 07/09/2014   Odynophagia 07/09/2014   Abnormal ECG 05/28/2014    ONSET DATE: ~ 2 years ago; 2022  REFERRING DIAG:  Diagnosis  G20.B1 (ICD-10-CM) - Parkinson's disease with dyskinesia, unspecified whether manifestations fluctuate (HCC)  R26.9 (ICD-10-CM) - Gait abnormality    THERAPY DIAG:  No diagnosis found.  Rationale for Evaluation and Treatment: Rehabilitation  SUBJECTIVE:  SUBJECTIVE STATEMENT: Ever since 2023, patient had COVID and began to have balance, weakness, and pulmonary issues. Patient has been around many specialists over the last 2 years. Recently, patient had a Neuro MD appointment and was diagnosed with Parkinson's. Patient pending more work-ups; patient referred for balance. Cardiac, Pulmonary MD's with no findings. Patient was put on Cabridopa-levodopa Nov 18th,  Pt accompanied by: significant other  PERTINENT HISTORY:  Brain surgery Jan 2024 Chronic hepatitis  Dyspnea   PAIN:  Are you having pain? No  PRECAUTIONS: None  RED FLAGS: None   WEIGHT BEARING RESTRICTIONS: No  FALLS: Has patient fallen in last 6 months? No  LIVING ENVIRONMENT: Lives with: lives with their spouse Lives in: House/apartment Stairs: Yes: Internal: 4-6  steps; can reach both and External: 4 steps; none Has following equipment at home: Single point cane and Walker - 2 wheeled  PLOF: Independent  PATIENT SURVEYS:    PATIENT GOALS: Patient wants to improve his endurance   OBJECTIVE:   DIAGNOSTIC  FINDINGS:   IMPRESSION: 1. No acute intracranial abnormality. Suggestion of chronic pituitary adenoma resection since the 2023 MRI. No adverse postoperative features by CT. 2. Chronic left sphenoid sinus inflammation.    COGNITION: Overall cognitive status: Within functional limits for tasks assessed    SENSATION: WFL    COORDINATION:  Finger to nose: delayed   POSTURE: increased thoracic kyphosis and flexed trunk    CARDIOPULMONARY Blood pressure during positioning  Supine: 129/90  Seated: 104/79  Standing: 88/62  Supine #2: 141/94  + Orthostatic hypotension  Vitals: 95 bpm, 95% SpO2 @ rest     FUNCTIONAL TESTS:  Timed up and go (TUG): next session 2 minute walk test: next session     GAIT ANALYSIS: Gait pattern: decreased arm swing- Right, decreased arm swing- Left, decreased step length- Right, decreased step length- Left, shuffling, trunk flexed, and narrow BOS Distance walked: 25 ft Assistive device utilized: None Level of assistance: SBA    BED MOBILITY:  Sit to supine SBA  TRANSFERS: Assistive device utilized: None  Sit to stand: SBA Stand to sit: SBA Chair to chair: SBA    LOWER EXTREMITY MMT:    Bilateral hips, knees grossly 3+/5 to 4-/5 based on functional transfers, mobility performed   MUSCLE TONE: [facility]     TODAY'S TREATMENT:                                                                                                                              DATE:   01/27/23    PATIENT EDUCATION: Education details: activity modification, postioning for blood pressure Person educated: Patient and Spouse Education method: Explanation Education comprehension: verbalized understanding  HOME EXERCISE PROGRAM: TBD  GOALS: Goals reviewed with patient? No  SHORT TERM GOALS: Target date: 02/24/2023     1.  Patient will be able to complete the TUG(Timed Up and Go) Test within 20 seconds with no assistance to improve overall gait speed  and decrease Falls Risk  Baseline:  Goal status: INITIAL  2.  Patient will be independent with a basic stretching/strengthening HEP Baseline:  Goal status: INITIAL   LONG TERM GOALS: Target date: 03/24/2023    Patient will be able to complete the TUG(Timed Up and Go) Test within 15 seconds with no assistance to improve overall gait speed and decrease Falls Risk  Baseline:  Goal status: INITIAL  2.    Patient will be able to complete >=/ 275 feet on the with stand-by assist to improve overall gait speed and decrease Falls Risk  Baseline:  Goal status: INITIAL  3.   Patient will be independent with a comprehensive strengthening HEP  Baseline:  Goal status: INITIAL  ASSESSMENT:  CLINICAL IMPRESSION: Patient is a 67 y.o. male who was seen today for physical therapy evaluation and treatment for decreased strength, endurance, mobility and unsteadiness on feet.   OBJECTIVE IMPAIRMENTS: Abnormal gait, cardiopulmonary status limiting activity, decreased activity tolerance, decreased balance, decreased coordination, decreased endurance, difficulty walking, decreased strength, and dizziness.   ACTIVITY LIMITATIONS: carrying, lifting, bending, standing, squatting, stairs, transfers, bed mobility, bathing, hygiene/grooming, and locomotion level  PARTICIPATION LIMITATIONS: meal prep, cleaning, laundry, interpersonal relationship, driving, shopping, community activity, yard work, and church  PERSONAL FACTORS: 1 comorbidity: parkinsons  are also affecting patient's functional outcome.   REHAB POTENTIAL: Fair    CLINICAL DECISION MAKING: Stable/uncomplicated  EVALUATION COMPLEXITY: Moderate  PLAN:  PT FREQUENCY: 1-2x/week  PT DURATION: 8 weeks  PLANNED INTERVENTIONS: 97110-Therapeutic exercises, 97530- Therapeutic activity, 97112- Neuromuscular re-education, 97535- Self Care, 19147- Manual therapy, [phone]- Gait training, Patient/Family education, Balance training, Stair training,  Joint mobilization, Joint manipulation, Spinal manipulation, Spinal mobilization, Vestibular training, DME instructions, Cryotherapy, and Moist heat  PLAN FOR NEXT SESSION: Progress lower extremity strength, endurance    Seymour Bars, PT 01/27/2023, 10:33 AM

## 2023-02-03 ENCOUNTER — Ambulatory Visit (HOSPITAL_COMMUNITY): Payer: No Typology Code available for payment source

## 2023-02-04 ENCOUNTER — Ambulatory Visit (HOSPITAL_COMMUNITY): Payer: No Typology Code available for payment source

## 2023-02-04 DIAGNOSIS — R531 Weakness: Secondary | ICD-10-CM | POA: Diagnosis not present

## 2023-02-04 DIAGNOSIS — R2681 Unsteadiness on feet: Secondary | ICD-10-CM

## 2023-02-04 DIAGNOSIS — M6281 Muscle weakness (generalized): Secondary | ICD-10-CM

## 2023-02-04 DIAGNOSIS — R6889 Other general symptoms and signs: Secondary | ICD-10-CM

## 2023-02-04 NOTE — Therapy (Signed)
[facility] OUTPATIENT PHYSICAL THERAPY NEURO EVALUATION   Patient Name: Bobby York MRN: [id number] DOB:[birth year], 67 y.o., male Today's Date: 02/04/2023   PCP: Assunta Found, MDRef Provider (PCP)  REFERRING PROVIDER:   Windell Norfolk, MD    END OF SESSION:   PT End of Session - 02/04/23 1350     Visit Number 2    Number of Visits 16    Authorization Type [employer] [place] [employer] secondary    PT Start Time 0140    PT Stop Time 0220    PT Time Calculation (min) 40 min    Activity Tolerance Patient tolerated treatment well;Patient limited by fatigue             Past Medical History:  Diagnosis Date   Bipolar 1 disorder (HCC)    diagnosed 2004   Dyspnea    Hepatitis    Hep C   Substance abuse (HCC) 2021   Past Surgical History:  Procedure Laterality Date   BACK SURGERY     two-lumbar disc x2   CLAVICLE SURGERY     COLONOSCOPY WITH PROPOFOL N/A 07/25/2014   MVH:QIONGE rectal polyp otherwise normal . hyperplastic polyp. TCS in 07/2024   CRANIOTOMY N/A 03/12/2022   Procedure: ENDOSCOPIC ENDONASAL TRANSPHENOIDAL RESECTION OF PITUITARY MASS;  Surgeon: Bedelia Person, MD;  Location: [facility] OR;  Service: Neurosurgery;  Laterality: N/A;   ESOPHAGEAL DILATION N/A 07/25/2014   Procedure: ESOPHAGEAL DILATION;  Surgeon: Corbin Ade, MD;  Location: [facility] ORS;  Service: Endoscopy;  Laterality: N/A;  Malony dilators # 56,   ESOPHAGOGASTRODUODENOSCOPY (EGD) WITH PROPOFOL N/A 07/25/2014   XBM:WUXL erosive reflux/s/p dilator/HH. +hpylor    NECK SURGERY     TRANSPHENOIDAL APPROACH EXPOSURE N/A 03/12/2022   Procedure: TRANSPHENOIDAL ENDOSCOPIC RESCTION OF PITUITARY ADENOMA;  Surgeon: Laren Boom, DO;  Location: [facility] OR;  Service: ENT;  Laterality: N/A;   Patient Active Problem List   Diagnosis Date Noted   H/O partial hypophysectomy (HCC) 06/21/2022   Pituitary mass (HCC) 03/12/2022   Pituitary tumor 03/12/2022   DOE (dyspnea on exertion) 08/27/2021   Pneumothorax, traumatic  04/14/2016   H. pylori infection 08/22/2014   Reflux esophagitis    Dysphagia, pharyngoesophageal phase    Mucosal abnormality of stomach    History of colonic polyps    Chronic hepatitis C (HCC) 07/09/2014   Abnormal weight loss 07/09/2014   History of ETOH abuse 07/09/2014   Esophageal dysphagia 07/09/2014   Odynophagia 07/09/2014   Abnormal ECG 05/28/2014    ONSET DATE: ~ 2 years ago; 2022  REFERRING DIAG:  Diagnosis  G20.B1 (ICD-10-CM) - Parkinson's disease with dyskinesia, unspecified whether manifestations fluctuate (HCC)  R26.9 (ICD-10-CM) - Gait abnormality    THERAPY DIAG:  No diagnosis found.  Rationale for Evaluation and Treatment: Rehabilitation  SUBJECTIVE:  SUBJECTIVE STATEMENT: Today: Patient with no pain today; reports frequent difficulty to catch his breath.    IE: Ever since 2023, patient had COVID and began to have balance, weakness, and pulmonary issues. Patient has been around many specialists over the last 2 years. Recently, patient had a Neuro MD appointment and was diagnosed with Parkinson's. Patient pending more work-ups; patient referred for balance. Cardiac, Pulmonary MD's with no findings. Patient was put on Cabridopa-levodopa Nov 18th,  Pt accompanied by: significant other  PERTINENT HISTORY:  Brain surgery Jan 2024 Chronic hepatitis  Dyspnea   PAIN:  Are you having pain? No  PRECAUTIONS: None  RED FLAGS: None   WEIGHT BEARING RESTRICTIONS: No  FALLS: Has patient fallen in last 6 months? No  LIVING ENVIRONMENT: Lives with: lives with their spouse Lives in: House/apartment Stairs: Yes: Internal: 4-6  steps; can reach both and External: 4 steps; none Has following equipment at home: Single point cane and Walker - 2 wheeled  PLOF:  Independent  PATIENT SURVEYS:    PATIENT GOALS: Patient wants to improve his endurance   OBJECTIVE:   DIAGNOSTIC FINDINGS:   IMPRESSION: 1. No acute intracranial abnormality. Suggestion of chronic pituitary adenoma resection since the 2023 MRI. No adverse postoperative features by CT. 2. Chronic left sphenoid sinus inflammation.    COGNITION: Overall cognitive status: Within functional limits for tasks assessed    SENSATION: WFL    COORDINATION:  Finger to nose: delayed   POSTURE: increased thoracic kyphosis and flexed trunk    CARDIOPULMONARY Blood pressure during positioning  Supine: 129/90  Seated: 104/79  Standing: 88/62  Supine #2: 141/94  + Orthostatic hypotension  Vitals: 95 bpm, 95% SpO2 @ rest     FUNCTIONAL TESTS:  Timed up and go (TUG): next session 2 minute walk test: next session     GAIT ANALYSIS: Gait pattern: decreased arm swing- Right, decreased arm swing- Left, decreased step length- Right, decreased step length- Left, shuffling, trunk flexed, and narrow BOS Distance walked: 25 ft Assistive device utilized: None Level of assistance: SBA    BED MOBILITY:  Sit to supine SBA  TRANSFERS: Assistive device utilized: None  Sit to stand: SBA Stand to sit: SBA Chair to chair: SBA    LOWER EXTREMITY MMT:    Bilateral hips, knees grossly 3+/5 to 4-/5 based on functional transfers, mobility performed   MUSCLE TONE: [facility]     TODAY'S TREATMENT:                                                                                                                              DATE:   02/04/23 Standing  Step ups 4" step with 3# 2x10, bilateral   Hookyling with bolster  Manual therapy including: diaphragmatic breathing into PT overpressure, ribcage breathing(upper/lateral lobes- anterior) into PT overpressure, sternum mobilization  grade 2, manual therapy along ribs, diaphragm Supine/ seated Diaphragmatic Breathing      PATIENT  EDUCATION: Education details: activity  modification, postioning for blood pressure Person educated: Patient and Spouse Education method: Explanation Education comprehension: verbalized understanding  HOME EXERCISE PROGRAM: Access Code: [email or web address] URL: [email or web address] Date: 02/04/2023 Prepared by: Seymour Bars  Exercises - Supine Diaphragmatic Breathing  - 1-3 x daily - 7 x weekly - Seated Diaphragmatic Breathing  - 1-3 x daily - 7 x weekly  GOALS: Goals reviewed with patient? No  SHORT TERM GOALS: Target date: 02/24/2023     1.  Patient will be able to complete the TUG(Timed Up and Go) Test within 20 seconds with no assistance to improve overall gait speed and decrease Falls Risk  Baseline:  Goal status: INITIAL  2.  Patient will be independent with a basic stretching/strengthening HEP Baseline:  Goal status: INITIAL   LONG TERM GOALS: Target date: 03/24/2023    Patient will be able to complete the TUG(Timed Up and Go) Test within 15 seconds with no assistance to improve overall gait speed and decrease Falls Risk  Baseline:  Goal status: INITIAL  2.    Patient will be able to complete >=/ 275 feet on the with stand-by assist to improve overall gait speed and decrease Falls Risk  Baseline:  Goal status: INITIAL  3.   Patient will be independent with a comprehensive strengthening HEP  Baseline:  Goal status: INITIAL  ASSESSMENT:  CLINICAL IMPRESSION:  Today: PT took BP with patient sitting prior to session due to PMH of low blood pressure: 86/66 seated. PT then attempted step ups with patient in the parallel bars but patient reports moderate shortness of breath. States this happens at home a lot and it makes him anxious. Patient began to hyperventilate. PT laid patient in hookyling with bolster + legs elevated; SpO2 96% HR 90. PT then began manual therapy to teach patient to use diaphragmatic breathing to facilitate relaxation. PT assessed  ribcage mobility; patient with minimal elevation of chest during breathing. Session focused on teaching patient to breathe fully into each lobe. End of session patient reports relaxation and BP increased to 100/72, HR down to 70 @ end of session  Pt will continue to benefit from PT to return to plof    -----------------------------------------------------------------------------------------------------------------------------------------  Patient is a 67 y.o. male who was seen today for physical therapy evaluation and treatment for decreased strength, endurance, mobility and unsteadiness on feet.   OBJECTIVE IMPAIRMENTS: Abnormal gait, cardiopulmonary status limiting activity, decreased activity tolerance, decreased balance, decreased coordination, decreased endurance, difficulty walking, decreased strength, and dizziness.   ACTIVITY LIMITATIONS: carrying, lifting, bending, standing, squatting, stairs, transfers, bed mobility, bathing, hygiene/grooming, and locomotion level  PARTICIPATION LIMITATIONS: meal prep, cleaning, laundry, interpersonal relationship, driving, shopping, community activity, yard work, and church  PERSONAL FACTORS: 1 comorbidity: parkinsons  are also affecting patient's functional outcome.   REHAB POTENTIAL: Fair    CLINICAL DECISION MAKING: Stable/uncomplicated  EVALUATION COMPLEXITY: Moderate  PLAN:  PT FREQUENCY: 1-2x/week  PT DURATION: 8 weeks  PLANNED INTERVENTIONS: 97110-Therapeutic exercises, 97530- Therapeutic activity, 97112- Neuromuscular re-education, 97535- Self Care, 16109- Manual therapy, [phone]- Gait training, Patient/Family education, Balance training, Stair training, Joint mobilization, Joint manipulation, Spinal manipulation, Spinal mobilization, Vestibular training, DME instructions, Cryotherapy, and Moist heat  PLAN FOR NEXT SESSION: Progress lower extremity strength, endurance    Seymour Bars, PT 02/04/2023, 1:51 PM

## 2023-02-07 ENCOUNTER — Ambulatory Visit (HOSPITAL_COMMUNITY): Payer: No Typology Code available for payment source

## 2023-02-07 DIAGNOSIS — R531 Weakness: Secondary | ICD-10-CM

## 2023-02-07 DIAGNOSIS — M6281 Muscle weakness (generalized): Secondary | ICD-10-CM

## 2023-02-07 DIAGNOSIS — R2681 Unsteadiness on feet: Secondary | ICD-10-CM

## 2023-02-07 NOTE — Therapy (Signed)
OUTPATIENT PHYSICAL THERAPY NEURO EVALUATION   Patient Name: Bobby York MRN: [id number] DOB:[birth year], 67 y.o., male Today's Date: 02/07/2023    PHYSICAL THERAPY DISCHARGE SUMMARY  Visits from Start of Care: 3  Current functional level related to goals / functional outcomes: See below   Remaining deficits: See below   Education / Equipment: N/a   Patient agrees to discharge. Patient goals were not met. Patient is being discharged due to  patient's current functional level; not candidate for OPPT at this moment, see below.   PCP: Assunta Found, MDRef Provider (PCP)  REFERRING PROVIDER:   Windell Norfolk, MD    END OF SESSION:   PT End of Session - 02/07/23 1520     Visit Number 3    Number of Visits 16    Authorization Type [employer] [facility] secondary    PT Start Time [phone]    PT Stop Time 0355    PT Time Calculation (min) 40 min    Activity Tolerance Patient tolerated treatment well;Patient limited by fatigue             Past Medical History:  Diagnosis Date   Bipolar 1 disorder (HCC)    diagnosed 2004   Dyspnea    Hepatitis    Hep C   Substance abuse (HCC) 2021   Past Surgical History:  Procedure Laterality Date   BACK SURGERY     two-lumbar disc x2   CLAVICLE SURGERY     COLONOSCOPY WITH PROPOFOL N/A 07/25/2014   BMW:UXLKGM rectal polyp otherwise normal . hyperplastic polyp. TCS in 07/2024   CRANIOTOMY N/A 03/12/2022   Procedure: ENDOSCOPIC ENDONASAL TRANSPHENOIDAL RESECTION OF PITUITARY MASS;  Surgeon: Bedelia Person, MD;  Location: [facility] OR;  Service: Neurosurgery;  Laterality: N/A;   ESOPHAGEAL DILATION N/A 07/25/2014   Procedure: ESOPHAGEAL DILATION;  Surgeon: Corbin Ade, MD;  Location: [facility] ORS;  Service: Endoscopy;  Laterality: N/A;  Malony dilators # 56,   ESOPHAGOGASTRODUODENOSCOPY (EGD) WITH PROPOFOL N/A 07/25/2014   WNU:UVOZ erosive reflux/s/p dilator/HH. +hpylor    NECK SURGERY     TRANSPHENOIDAL APPROACH EXPOSURE N/A 03/12/2022    Procedure: TRANSPHENOIDAL ENDOSCOPIC RESCTION OF PITUITARY ADENOMA;  Surgeon: Laren Boom, DO;  Location: [facility] OR;  Service: ENT;  Laterality: N/A;   Patient Active Problem List   Diagnosis Date Noted   H/O partial hypophysectomy (HCC) 06/21/2022   Pituitary mass (HCC) 03/12/2022   Pituitary tumor 03/12/2022   DOE (dyspnea on exertion) 08/27/2021   Pneumothorax, traumatic 04/14/2016   H. pylori infection 08/22/2014   Reflux esophagitis    Dysphagia, pharyngoesophageal phase    Mucosal abnormality of stomach    History of colonic polyps    Chronic hepatitis C (HCC) 07/09/2014   Abnormal weight loss 07/09/2014   History of ETOH abuse 07/09/2014   Esophageal dysphagia 07/09/2014   Odynophagia 07/09/2014   Abnormal ECG 05/28/2014    ONSET DATE: ~ 2 years ago; 2022  REFERRING DIAG:  Diagnosis  G20.B1 (ICD-10-CM) - Parkinson's disease with dyskinesia, unspecified whether manifestations fluctuate (HCC)  R26.9 (ICD-10-CM) - Gait abnormality    THERAPY DIAG:  Decreased strength, endurance, and mobility  Unsteadiness on feet  Muscle weakness (generalized)  Rationale for Evaluation and Treatment: Rehabilitation  SUBJECTIVE:  SUBJECTIVE STATEMENT: Today(wife present): Patient reports continued shortness of breath; unable to walk more than to mailbox and back @ home without fatigue.   IE: Ever since 2023, patient had COVID and began to have balance, weakness, and pulmonary issues. Patient has been around many specialists over the last 2 years. Recently, patient had a Neuro MD appointment and was diagnosed with Parkinson's. Patient pending more work-ups; patient referred for balance. Cardiac, Pulmonary MD's with no findings. Patient was put on Cabridopa-levodopa Nov 18th,  Pt accompanied by:  significant other  PERTINENT HISTORY:   Brain surgery Jan 2024 Chronic hepatitis  Dyspnea   PAIN:  Are you having pain? No  PRECAUTIONS: None  RED FLAGS: None   WEIGHT BEARING RESTRICTIONS: No  FALLS: Has patient fallen in last 6 months? No  LIVING ENVIRONMENT: Lives with: lives with their spouse Lives in: House/apartment Stairs: Yes: Internal: 4-6  steps; can reach both and External: 4 steps; none Has following equipment at home: Single point cane and Walker - 2 wheeled  PLOF: Independent  PATIENT SURVEYS:    PATIENT GOALS: Patient wants to improve his endurance   OBJECTIVE:   DIAGNOSTIC FINDINGS:   IMPRESSION: 1. No acute intracranial abnormality. Suggestion of chronic pituitary adenoma resection since the 2023 MRI. No adverse postoperative features by CT. 2. Chronic left sphenoid sinus inflammation.    COGNITION: Overall cognitive status: Within functional limits for tasks assessed    SENSATION: WFL    COORDINATION:  Finger to nose: delayed   POSTURE: increased thoracic kyphosis and flexed trunk    CARDIOPULMONARY Blood pressure during positioning  Supine: 129/90  Seated: 104/79  Standing: 88/62  Supine #2: 141/94  + Orthostatic hypotension  Vitals: 95 bpm, 95% SpO2 @ rest     FUNCTIONAL TESTS:  Timed up and go (TUG): 45.25s using rolling walker, stand-by assist    </= 10 seconds = normal >/= 14 seconds = indicative of high falls risk    GAIT ANALYSIS: Gait pattern: decreased arm swing- Right, decreased arm swing- Left, decreased step length- Right, decreased step length- Left, shuffling, trunk flexed, and narrow BOS Distance walked: 25 ft Assistive device utilized: None Level of assistance: SBA    BED MOBILITY:  Sit to supine SBA  TRANSFERS: Assistive device utilized: None  Sit to stand: SBA Stand to sit: SBA Chair to chair: SBA    LOWER EXTREMITY MMT:    Bilateral hips, knees grossly 3+/5 to 4-/5 based on  functional transfers, mobility performed   MUSCLE TONE: [facility]     TODAY'S TREATMENT:                                                                                                                              DATE:   02/07/23 TUG + rolling walker, 45.25s  Ambulation around clinic + rolling walker  Hookyling      02/04/23 Standing  Step ups 4" step with 3# 2x10, bilateral   Hookyling  with bolster  Manual therapy including: diaphragmatic breathing into PT overpressure, ribcage breathing(upper/lateral lobes- anterior) into PT overpressure, sternum mobilization  grade 2, manual therapy along ribs, diaphragm Supine/ seated Diaphragmatic Breathing      PATIENT EDUCATION: Education details: activity modification, postioning for blood pressure Person educated: Patient and Spouse Education method: Explanation Education comprehension: verbalized understanding  HOME EXERCISE PROGRAM: Access Code: [email or web address] URL: [email or web address] Date: 02/04/2023 Prepared by: Seymour Bars  Exercises - Supine Diaphragmatic Breathing  - 1-3 x daily - 7 x weekly - Seated Diaphragmatic Breathing  - 1-3 x daily - 7 x weekly  GOALS: Goals reviewed with patient? No  SHORT TERM GOALS: Target date: 02/24/2023     1.  Patient will be able to complete the TUG(Timed Up and Go) Test within 20 seconds with no assistance to improve overall gait speed and decrease Falls Risk  Baseline:  Goal status: INITIAL  2.  Patient will be independent with a basic stretching/strengthening HEP Baseline:  Goal status: INITIAL   LONG TERM GOALS: Target date: 03/24/2023    Patient will be able to complete the TUG(Timed Up and Go) Test within 15 seconds with no assistance to improve overall gait speed and decrease Falls Risk  Baseline:  Goal status: INITIAL  2.    Patient will be able to complete >=/ 275 feet on the with stand-by assist to improve overall gait speed and decrease Falls Risk   Baseline:  Goal status: INITIAL  3.   Patient will be independent with a comprehensive strengthening HEP  Baseline:  Goal status: INITIAL  ASSESSMENT:  CLINICAL IMPRESSION:  Today: PT arrives to clinic with reports of moderate fatigue/ dizziness. Seated BP reads 78/66. Patient reports this is normal for him. PT brought clinic's rolling walker to use for the session due to fatigue. PT performed TUG test in which patient scored over 45s which is a HIGH Falls Risk; patient requires seated break after TUG with SpO2 @ 96%, 101 HR. PT then attempted to try NuStep bike but patient felt light headed; PT to check BP and was too low to show on BP Cuff. PT then walked with patient over to a mat and put patient into Hooklying + feet elevated to elevate BP; back up to 128/78.  PT discussed with patient and wife that patient is not a candidate for Outpatient PT at this moment due to uncontrolled Orthostatic Hypotension. PT will be discharging patient at this time due to uncontrolled vitals. PT recommends follow-up with patient's Cardiologist and possible cardiac rehab prior to returning to outpatient.    -----------------------------------------------------------------------------------------------------------------------------------------  Patient is a 67 y.o. male who was seen today for physical therapy evaluation and treatment for decreased strength, endurance, mobility and unsteadiness on feet.   OBJECTIVE IMPAIRMENTS: Abnormal gait, cardiopulmonary status limiting activity, decreased activity tolerance, decreased balance, decreased coordination, decreased endurance, difficulty walking, decreased strength, and dizziness.   ACTIVITY LIMITATIONS: carrying, lifting, bending, standing, squatting, stairs, transfers, bed mobility, bathing, hygiene/grooming, and locomotion level  PARTICIPATION LIMITATIONS: meal prep, cleaning, laundry, interpersonal relationship, driving, shopping, community activity, yard  work, and church  PERSONAL FACTORS: 1 comorbidity: parkinsons  are also affecting patient's functional outcome.   REHAB POTENTIAL: Fair    CLINICAL DECISION MAKING: Stable/uncomplicated  EVALUATION COMPLEXITY: Moderate  PLAN:  PT FREQUENCY: 1-2x/week  PT DURATION: 8 weeks  PLANNED INTERVENTIONS: 97110-Therapeutic exercises, 97530- Therapeutic activity, [id number]- Neuromuscular re-education, 97535- Self Care, 91478- Manual therapy, [phone]- Gait training, Patient/Family education, Balance training, Stair training, Joint mobilization, Joint  manipulation, Spinal manipulation, Spinal mobilization, Vestibular training, DME instructions, Cryotherapy, and Moist heat  PLAN FOR NEXT SESSION: Progress lower extremity strength, endurance    Seymour Bars, PT 02/07/2023, 3:21 PM

## 2023-02-08 NOTE — Progress Notes (Unsigned)
CARDIOLOGY CONSULT NOTE       Patient ID: KYNGSTON AMORIN MRN: [id number] DOB/AGE: 05/18/1955 67 y.o.  Referring Physician: Phillips Odor Primary Physician: Assunta Found, MD Primary Cardiologist: Eden Emms  Reason for Consultation: Dyspnea/POTS    HPI:  67 y.o. referred by Dr Phillips Odor for  dyspnea with ER visit 07/15/20 History of Bipolar disease and hepatitis, ETOH/Cocaine abuse/use. No history of vascular disease or CAD. On statin for HLD LDL 135 last year. Seen in ED 07/15/20 with dyspnea and nausea Sats fine CXR NAD COVID/Influenza negative D dimer normal troponin negative Hct 42.8  and no acute ECG changes History of gastritis with EGD 2016 and esophageal dilation   He indicated some tightness in the exam room and ECG was normal On Cogentin for tremors worse in right arm/hand Dose lowered recently   He was in the [employer]. Sees [facility] Son deceased and daughter works in school system in [place] Has 3 older grandchildren  BP seems to run low with some postural symptoms when taking bipolar meds  TTE 10/07/20 normal EF 60-65%  Myovue 09/11/20 normal no ischemia EF 54%   Seen in ED 68/29/22 with low BP and dizziness Given fluids and D/c Primary tried to d/c Seroquel but patient had insomnia BUN/CR normal not azotemic  And Hct 42 Has had MRI 09/09/20 with unchanged size of pituitary macroadenoma Sees [facility] neurosurgery for CNS mass, RUE tremor Has had multiple cervical and lumbar spine surgeries   He does not have a cardiac problem he has orthostasis and worsening PTSD/panic  This can be taken care of by his primary, neurology and behavioral specialist at [facility] he is on sinemet and celexa along with desyrel   August had ? Sezure/syncope He had covid and was Rx with paxlovid. Had pituitary gland tumor removed in January has been on xanax for tremors and anxiety Neurology diagnosed him with Parkinson's 01/07/23 Found resting tremor, bradykinesia, REM sleep disorder, constipation and  orthostatic hypotension. Sent to PT/OT and considered f/u DAT scan   More chest pain Had pain during visit ECG normal Pain not pleuritic and not reproducible with muscular palpation/movement. Discussed going back on midodrine to support BP    ROS All other systems reviewed and negative except as noted above  Past Medical History:  Diagnosis Date   Bipolar 1 disorder (HCC)    diagnosed 2004   Dyspnea    Hepatitis    Hep C   Substance abuse (HCC) 2021    Family History  Problem Relation Age of Onset   Heart disease Mother    Diabetes Mother    Prostate cancer Father    Liver disease Neg Hx    Colon cancer Neg Hx     Social History   Socioeconomic History   Marital status: Married    Spouse name: Not on file   Number of children: Not on file   Years of education: Not on file   Highest education level: Not on file  Occupational History   Not on file  Tobacco Use   Smoking status: Former    Current packs/day: 0.00    Average packs/day: 0.4 packs/day for 50.8 years (20.3 ttl pk-yrs)    Types: Cigarettes    Start date: 05/28/1970    Quit date: 03/2021    Years since quitting: 1.8   Smokeless tobacco: Never   Tobacco comments:    Pt states he stopped smoking about 4-5 months ago // mr [facility] 08/27/2021   Vaping Use  Vaping status: Never Used  Substance and Sexual Activity   Alcohol use: Not Currently    Comment: AS of today, last use in 2021   Drug use: Not Currently    Types: Marijuana    Comment: AS of today, last use in 2021   Sexual activity: Yes    Birth control/protection: None  Other Topics Concern   Not on file  Social History Narrative   Not on file   Social Drivers of Health   Financial Resource Strain: Not on file  Food Insecurity: Not on file  Transportation Needs: Not on file  Physical Activity: Not on file  Stress: Not on file  Social Connections: Unknown (07/07/2021)   Received from [facility], [facility]   Social Network    Social Network:  Not on file  Intimate Partner Violence: Unknown (05/29/2021)   Received from [facility], [facility]   HITS    Physically Hurt: Not on file    Insult or Talk Down To: Not on file    Threaten Physical Harm: Not on file    Scream or Curse: Not on file    Past Surgical History:  Procedure Laterality Date   BACK SURGERY     two-lumbar disc x2   CLAVICLE SURGERY     COLONOSCOPY WITH PROPOFOL N/A 07/25/2014   FAO:ZHYQMV rectal polyp otherwise normal . hyperplastic polyp. TCS in 07/2024   CRANIOTOMY N/A 03/12/2022   Procedure: ENDOSCOPIC ENDONASAL TRANSPHENOIDAL RESECTION OF PITUITARY MASS;  Surgeon: Bedelia Person, MD;  Location: [facility] OR;  Service: Neurosurgery;  Laterality: N/A;   ESOPHAGEAL DILATION N/A 07/25/2014   Procedure: ESOPHAGEAL DILATION;  Surgeon: Corbin Ade, MD;  Location: [facility] ORS;  Service: Endoscopy;  Laterality: N/A;  Malony dilators # 56,   ESOPHAGOGASTRODUODENOSCOPY (EGD) WITH PROPOFOL N/A 07/25/2014   HQI:ONGE erosive reflux/s/p dilator/HH. +hpylor    NECK SURGERY     TRANSPHENOIDAL APPROACH EXPOSURE N/A 03/12/2022   Procedure: TRANSPHENOIDAL ENDOSCOPIC RESCTION OF PITUITARY ADENOMA;  Surgeon: Laren Boom, DO;  Location: [facility] OR;  Service: ENT;  Laterality: N/A;      Current Outpatient Medications:    Cholecalciferol 125 MCG (5000 UT) TABS, Take 5,000 Units by mouth daily., Disp: , Rfl:    famotidine (PEPCID) 20 MG tablet, Take 1 tablet by mouth daily., Disp: , Rfl:    Omega-3 Fatty Acids (FISH OIL OMEGA-3 PO), Take 1 tablet by mouth daily., Disp: , Rfl:    pantoprazole (PROTONIX) 40 MG tablet, Take 40 mg by mouth daily., Disp: , Rfl:    traZODone (DESYREL) 50 MG tablet, Take 25 mg by mouth at bedtime., Disp: , Rfl:    carbidopa-levodopa (SINEMET IR) 25-100 MG tablet, Take 1 tablet by mouth 3 (three) times daily., Disp: 90 tablet, Rfl: 0   citalopram (CELEXA) 10 MG tablet, Take 1 tablet (10 mg total) by mouth daily. (Patient not taking: Reported on 02/10/2023),  Disp: 90 tablet, Rfl: 3    Physical Exam: Blood pressure (!) 100/52, pulse 99, height 5\' 11"  (1.803 m), weight 192 lb 9.6 oz (87.4 kg), SpO2 98%.   Affect appropriate Healthy:  appears stated age HEENT: normal Neck supple with no adenopathy JVP normal no bruits no thyromegaly Lungs clear with no wheezing and good diaphragmatic motion Heart:  S1/S2 no murmur, no rub, gallop or click PMI normal Abdomen: benighn, BS positve, no tenderness, no AAA no bruit.  No HSM or HJR Distal pulses intact with no bruits No edema Neuro course LUE  tremor  Skin warm and dry No muscular weakness   Labs:   Lab Results  Component Value Date   WBC 6.3 10/21/2022   HGB 13.8 10/21/2022   HCT 41.1 10/21/2022   MCV 90.5 10/21/2022   PLT 258 10/21/2022   No results for input(s): "NA", "K", "CL", "CO2", "BUN", "CREATININE", "CALCIUM", "PROT", "BILITOT", "ALKPHOS", "ALT", "AST", "GLUCOSE" in the last 168 hours.  Invalid input(s): "LABALBU" No results found for: "CKTOTAL", "CKMB", "CKMBINDEX", "TROPONINI" No results found for: "CHOL" No results found for: "HDL" No results found for: "LDLCALC" No results found for: "TRIG" No results found for: "CHOLHDL" No results found for: "LDLDIRECT"    Radiology: No results found.  EKG: 02/10/2023 SR rate 99 normal     ASSESSMENT AND PLAN:   Dyspnea:  w/u negative sats fine CXR NAD TTE 10/07/20 EF 60-65% normal RV and valves  HLD:  continue statin  Bipolar: mood stable continue Seroquel d/c meds make orthostasis worse  CAD:  risk with atypical chest pain Normal myovue 09/11/20 Pain is recurrent ECG normal today update stress testing with PET/CT  Parkinson's:  started on Sinemet 12/2022 explains a lot including orthostasis. F/U neuro for DAT scan  Meningioma/Macroadenoma:  f/u neurosurgery Post resection Follow hormone levels closely cortisol and TSH normal 12/23/22  Postural Hypotension:  related to neurologic issues This is not a cardiac issue !!  Filled  script for midodrine again    Midodrine 10 mg tid as needed  PET/CT  F/U primary neurology and neurosurgery   No need for routine cardiology f/u   Signed: Charlton Haws 02/10/2023, 7:59 AM

## 2023-02-09 ENCOUNTER — Encounter (HOSPITAL_COMMUNITY): Payer: No Typology Code available for payment source

## 2023-02-10 ENCOUNTER — Encounter: Payer: Self-pay | Admitting: Cardiovascular Disease

## 2023-02-10 ENCOUNTER — Ambulatory Visit
Payer: No Typology Code available for payment source | Attending: Cardiovascular Disease | Admitting: Cardiovascular Disease

## 2023-02-10 VITALS — BP 100/52 | HR 99 | Ht 71.0 in | Wt 192.6 lb

## 2023-02-10 DIAGNOSIS — I951 Orthostatic hypotension: Secondary | ICD-10-CM

## 2023-02-10 DIAGNOSIS — R079 Chest pain, unspecified: Secondary | ICD-10-CM | POA: Diagnosis not present

## 2023-02-10 MED ORDER — MIDODRINE HCL 10 MG PO TABS: 10.0000 mg | ORAL_TABLET | Freq: Three times a day (TID) | ORAL | 3 refills | Status: DC

## 2023-02-10 NOTE — Patient Instructions (Signed)
Medication Instructions:  Your physician has recommended you make the following change in your medication:  1.) start midodrine 10 mg - take one tablet three times daily  *If you need a refill on your cardiac medications before your next appointment, please call your pharmacy*   Lab Work: none If you have labs (blood work) drawn today and your tests are completely normal, you will receive your results only by: MyChart Message (if you have MyChart) OR A paper copy in the mail If you have any lab test that is abnormal or we need to change your treatment, we will call you to review the results.   Testing/Procedures: NM PET CT SCAN - see instructions below   Follow-Up: At [facility], you and your health needs are our priority.  As part of our continuing mission to provide you with exceptional heart care, we have created designated Provider Care Teams.  These Care Teams include your primary Cardiologist (physician) and Advanced Practice Providers (APPs -  Physician Assistants and Nurse Practitioners) who all work together to provide you with the care you need, when you need it.   Your next appointment:   12 month(s)  Provider:   Charlton Haws, MD     Other Instructions    Please report to Radiology at the [facility] Main Entrance 30 minutes early for your test.  [address] [place], [place] [address]   How to Prepare for Your Cardiac PET/CT Stress Test:  Nothing to eat or drink, except water, 3 hours prior to arrival time.  NO caffeine/decaffeinated products, or chocolate 12 hours prior to arrival. (Please note decaffeinated beverages (teas/coffees) still contain caffeine).  If you have caffeine within 12 hours prior, the test will need to be rescheduled.  Medication instructions: Do not take erectile dysfunction medications for 72 hours prior to test (sildenafil, tadalafil) Do not take nitrates (isosorbide mononitrate, Ranexa) the day before or day of  test Do not take tamsulosin the day before or morning of test Hold theophylline containing medications for 12 hours. Hold Dipyridamole 48 hours prior to the test.  Diabetic Preparation: If able to eat breakfast prior to 3 hour fasting, you may take all medications, including your insulin. Do not worry if you miss your breakfast dose of insulin - start at your next meal. If you do not eat prior to 3 hour fast-Hold all diabetes (oral and insulin) medications. Patients who wear a continuous glucose monitor MUST remove the device prior to scanning.  You may take your remaining medications with water.  NO perfume, cologne or lotion on chest or abdomen area.   Total time is 1 to 2 hours; you may want to bring reading material for the waiting time.   In preparation for your appointment, medication and supplies will be purchased.  Appointment availability is limited, so if you need to cancel or reschedule, please call the Radiology Department at [phone] [facility]) OR [phone] [facility]) 24 hours in advance to avoid a cancellation fee of $100.00  What to Expect When you Arrive:  Once you arrive and check in for your appointment, you will be taken to a preparation room within the Radiology Department.  A technologist or Nurse will obtain your medical history, verify that you are correctly prepped for the exam, and explain the procedure.  Afterwards, an IV will be started in your arm and electrodes will be placed on your skin for EKG monitoring during the stress portion of the exam. Then  you will be escorted to the PET/CT scanner.  There, staff will get you positioned on the scanner and obtain a blood pressure and EKG.  During the exam, you will continue to be connected to the EKG and blood pressure machines.  A small, safe amount of a radioactive tracer will be injected in your IV to obtain a series of pictures of your heart along with an injection of a stress agent.    After your Exam:  It  is recommended that you eat a meal and drink a caffeinated beverage to counter act any effects of the stress agent.  Drink plenty of fluids for the remainder of the day and urinate frequently for the first couple of hours after the exam.  Your doctor will inform you of your test results within 7-10 business days.  For more information and frequently asked questions, please visit our website: [email or web address]  For questions about your test or how to prepare for your test, please call: Cardiac Imaging Nurse Navigators Office: [phone]

## 2023-02-14 ENCOUNTER — Other Ambulatory Visit: Payer: Self-pay | Admitting: Neurology

## 2023-02-14 NOTE — Telephone Encounter (Signed)
Rx refilled per last office visit note.

## 2023-02-18 ENCOUNTER — Other Ambulatory Visit: Payer: Self-pay

## 2023-02-18 ENCOUNTER — Telehealth: Payer: Self-pay | Admitting: Cardiovascular Disease

## 2023-02-18 ENCOUNTER — Encounter (HOSPITAL_COMMUNITY): Payer: Self-pay | Admitting: Emergency Medicine

## 2023-02-18 DIAGNOSIS — M25511 Pain in right shoulder: Secondary | ICD-10-CM | POA: Insufficient documentation

## 2023-02-18 DIAGNOSIS — R0789 Other chest pain: Secondary | ICD-10-CM | POA: Diagnosis present

## 2023-02-18 DIAGNOSIS — G20A1 Parkinson's disease without dyskinesia, without mention of fluctuations: Secondary | ICD-10-CM | POA: Diagnosis not present

## 2023-02-18 NOTE — Telephone Encounter (Signed)
Called patient back about midodrine. He feels an aching in his chest, elevated HR for patient in the 90's, and still feels dizzy. BP runs from 100/80 to 127/76. Encouraged patient to continue his midodrine. Wife is trying to get patient to drink more water. She is going to see how he feels over the weekend. Patient feels like his symptoms have not improved with the midodrine, but he states is slightly worse, "not drastically worse". Patient also is on Sinemet that lowers his BP. Will forward to Dr. Eden Emms for advisement. Patient to go to ED if symptoms get worse.

## 2023-02-18 NOTE — ED Triage Notes (Signed)
Pt with c/o R sided chest pain that has "been bothering him all day" states he feels like he has a "sprain or something" but "hasn't lifted anything".

## 2023-02-18 NOTE — Telephone Encounter (Signed)
Pt c/o medication issue:  1. Name of Medication: midodrine (PROAMATINE) 10 MG tablet   2. How are you currently taking this medication (dosage and times per day)?   3. Are you having a reaction (difficulty breathing--STAT)?   4. What is your medication issue?  Patient is requesting call back to discuss medication. He believes that this medication is not helping in raising HR and would like to know if there is something else he can try. Please advise.

## 2023-02-19 ENCOUNTER — Emergency Department (HOSPITAL_COMMUNITY)
Admission: EM | Admit: 2023-02-19 | Discharge: 2023-02-19 | Disposition: A | Discharge status: Home / Self Care | Payer: No Typology Code available for payment source | Attending: Emergency Medicine | Admitting: Emergency Medicine

## 2023-02-19 ENCOUNTER — Other Ambulatory Visit: Payer: Self-pay

## 2023-02-19 ENCOUNTER — Emergency Department (HOSPITAL_COMMUNITY): Payer: No Typology Code available for payment source

## 2023-02-19 DIAGNOSIS — R0789 Other chest pain: Secondary | ICD-10-CM

## 2023-02-19 LAB — BASIC METABOLIC PANEL
Anion gap: 9 (ref 5–15)
BUN: 14 mg/dL (ref 8–23)
CO2: 24 mmol/L (ref 22–32)
Calcium: 9.1 mg/dL (ref 8.9–10.3)
Chloride: 104 mmol/L (ref 98–111)
Creatinine, Ser: 1.38 mg/dL — ABNORMAL HIGH (ref 0.61–1.24)
GFR, Estimated: 56 mL/min — ABNORMAL LOW (ref >60)
Glucose, Bld: 133 mg/dL — ABNORMAL HIGH (ref 70–99)
Potassium: 3.3 mmol/L — ABNORMAL LOW (ref 3.5–5.1)
Sodium: 137 mmol/L (ref 135–145)

## 2023-02-19 LAB — CBC
HCT: 41.7 % (ref 39.0–52.0)
Hemoglobin: 14 g/dL (ref 13.0–17.0)
MCH: 30.6 pg (ref 26.0–34.0)
MCHC: 33.6 g/dL (ref 30.0–36.0)
MCV: 91 fL (ref 80.0–100.0)
Platelets: 250 10*3/uL (ref 150–400)
RBC: 4.58 MIL/uL (ref 4.22–5.81)
RDW: 12.2 % (ref 11.5–15.5)
WBC: 4.4 10*3/uL (ref 4.0–10.5)
nRBC: 0 % (ref 0.0–0.2)

## 2023-02-19 LAB — TROPONIN I (HIGH SENSITIVITY): Troponin I (High Sensitivity): 5 ng/L (ref <18)

## 2023-02-19 MED ORDER — HYDROCODONE-ACETAMINOPHEN 5-325 MG PO TABS
2.0000 | ORAL_TABLET | Freq: Once | ORAL | Status: AC
  Administered 2023-02-19: 2 via ORAL
  Filled 2023-02-19: qty 2

## 2023-02-19 MED ORDER — HYDROCODONE-ACETAMINOPHEN 5-325 MG PO TABS: 1.0000 | ORAL_TABLET | Freq: Four times a day (QID) | ORAL | 0 refills | Status: DC | PRN

## 2023-02-19 NOTE — Discharge Instructions (Signed)
Begin taking hydrocodone as prescribed as needed for pain.  Follow-up with primary doctor if symptoms are not improving, and return to the ER if you develop worsening pain, difficulty breathing, or for other new and concerning symptoms.

## 2023-02-19 NOTE — ED Provider Notes (Signed)
[place] EMERGENCY DEPARTMENT AT [facility] Provider Note   CSN: [id number] Arrival date & time: 02/18/23  2343     History  Chief Complaint  Patient presents with   Chest Pain    Bobby York is a 67 y.o. male.  Patient is a 67 year old male with past medical history of recently diagnosed Parkinson's disease, pituitary tumor surgery, hepatitis.  Patient presenting today for evaluation of chest discomfort.  Symptoms started yesterday, and worsened throughout the day.  He describes a sharp pain to the right upper chest and shoulder that is worse when he moves or changes position.  He denies shortness of breath, nausea, diaphoresis, or radiation to the arm or jaw.  Patient with no prior cardiac history.  He did have a stress test in July 2022 that was reassuring.  The history is provided by the patient.       Home Medications Prior to Admission medications   Medication Sig Start Date End Date Taking? Authorizing Provider  carbidopa-levodopa (SINEMET IR) 25-100 MG tablet TAKE 1 TABLET BY MOUTH THREE TIMES DAILY 02/14/23   Windell Norfolk, MD  Cholecalciferol 125 MCG (5000 UT) TABS Take 5,000 Units by mouth daily.    [provider]  citalopram (CELEXA) 10 MG tablet Take 1 tablet (10 mg total) by mouth daily. Patient not taking: Reported on 02/10/2023 01/07/23   Windell Norfolk, MD  famotidine (PEPCID) 20 MG tablet Take 1 tablet by mouth daily. 09/28/21   [provider]  midodrine (PROAMATINE) 10 MG tablet Take 1 tablet (10 mg total) by mouth 3 (three) times daily. 02/10/23   Wendall Stade, MD  Omega-3 Fatty Acids (FISH OIL OMEGA-3 PO) Take 1 tablet by mouth daily.    [provider]  pantoprazole (PROTONIX) 40 MG tablet Take 40 mg by mouth daily.    [provider]  traZODone (DESYREL) 50 MG tablet Take 25 mg by mouth at bedtime. 04/30/20   [provider]      Allergies    Atorvastatin    Review of Systems   Review of  Systems  All other systems reviewed and are negative.   Physical Exam Updated Vital Signs BP (!) 112/90   Pulse 88   Temp 97.8 F (36.6 C) (Oral)   Resp (!) 24   Ht 5\' 11"  (1.803 m)   Wt 88.5 kg   SpO2 95%   BMI 27.20 kg/m  Physical Exam Vitals and nursing note reviewed.  Constitutional:      General: He is not in acute distress.    Appearance: He is well-developed. He is not diaphoretic.  HENT:     Head: Normocephalic and atraumatic.  Cardiovascular:     Rate and Rhythm: Normal rate and regular rhythm.     Heart sounds: No murmur heard.    No friction rub.  Pulmonary:     Effort: Pulmonary effort is normal. No respiratory distress.     Breath sounds: Normal breath sounds. No wheezing or rales.  Abdominal:     General: Bowel sounds are normal. There is no distension.     Palpations: Abdomen is soft.     Tenderness: There is no abdominal tenderness.  Musculoskeletal:        General: Normal range of motion.     Cervical back: Normal range of motion and neck supple.     Right lower leg: No tenderness. No edema.     Left lower leg: No tenderness. No edema.  Comments: There is tenderness to palpation of the right upper chest and shoulder that reproduces his symptoms.  Skin:    General: Skin is warm and dry.  Neurological:     Mental Status: He is alert and oriented to person, place, and time.     Coordination: Coordination normal.     ED Results / Procedures / Treatments   Labs (all labs ordered are listed, but only abnormal results are displayed) Labs Reviewed  BASIC METABOLIC PANEL - Abnormal; Notable for the following components:      Result Value   Potassium 3.3 (*)    Glucose, Bld 133 (*)    Creatinine, Ser 1.38 (*)    GFR, Estimated 56 (*)    All other components within normal limits  CBC  TROPONIN I (HIGH SENSITIVITY)  TROPONIN I (HIGH SENSITIVITY)    EKG EKG Interpretation Date/Time:  Friday February 18 2023 23:52:30 EST Ventricular Rate:   89 PR Interval:  148 QRS Duration:  86 QT Interval:  404 QTC Calculation: 491 R Axis:   71  Text Interpretation: Normal sinus rhythm Nonspecific ST abnormality Prolonged QT Abnormal ECG When compared with ECG of 10-Feb-2023 07:55, No significant change was found Confirmed by Geoffery Lyons ([id number]) on 02/19/2023 12:07:17 AM  Radiology DG Chest 2 View Result Date: 02/19/2023 CLINICAL DATA:  Right-sided chest pain for 1 day, initial encounter EXAM: CHEST - 2 VIEW COMPARISON:  10/21/2022 FINDINGS: The heart size and mediastinal contours are within normal limits. Both lungs are clear. The visualized skeletal structures are unremarkable. Old healed rib fractures are noted on the right. IMPRESSION: No active cardiopulmonary disease. Electronically Signed   By: Alcide Clever M.D.   On: 02/19/2023 00:15    Procedures Procedures    Medications Ordered in ED Medications  HYDROcodone-acetaminophen (NORCO/VICODIN) 5-325 MG per tablet 2 tablet (has no administration in time range)    ED Course/ Medical Decision Making/ A&P  Patient presenting here with complaints of right upper chest and shoulder pain as described in the HPI.  Patient arrives here with stable vital signs and is afebrile.  Physical examination reveals reproducible tenderness of the chest wall and it is also worse when he moves or sits forward.  Laboratory studies obtained including CBC, metabolic panel, and troponin, all of which are unremarkable.  Chest x-ray is negative.  Patient given a dose of hydrocodone and will be discharged to home.  Symptoms are clearly musculoskeletal in nature.  His workup here is unremarkable and had a reassuring stress test 2 years ago.  Patient to be discharged with outpatient follow-up and as needed return.  Final Clinical Impression(s) / ED Diagnoses Final diagnoses:  None    Rx / DC Orders ED Discharge Orders     None         Geoffery Lyons, MD 02/19/23 [id number]

## 2023-02-21 ENCOUNTER — Telehealth: Payer: Self-pay | Admitting: Neurology

## 2023-02-21 MED ORDER — CARBIDOPA-LEVODOPA 25-100 MG PO TABS: 0.5000 | ORAL_TABLET | Freq: Three times a day (TID) | ORAL | 0 refills | Status: DC

## 2023-02-21 NOTE — Telephone Encounter (Signed)
Returned call to pt who expressed his bp has been running low lately 60/40 reported yesterday morning. He was seen in hospital Friday night for chest pain and low bp. Pt would like neurologist to review hospital notes and make recommendations. He took bp w/me on phone and it was 88/64

## 2023-02-21 NOTE — Telephone Encounter (Signed)
Per Dr. Eden Emms, If he does not tolerate midodrine can d/c and f/u with primary/neuro  Thiis is not a cardiac issue. Called patient back and informed him of Dr. Fabio Bering recommendations. Patient and his wife verbalized understanding and decided to stop the midodrine. Will update medication list.

## 2023-02-21 NOTE — Telephone Encounter (Signed)
Wife has called to report pt is still having continued low blood pressure.  Pt has recently been prescribed Vicodin from hospital visit this past Saturday, pt also now on Midodrine 10mg  as of 12/19.  Wife states pt's cardiologist made the suggestion they check in with pt's Neurologist to see if there would be any suggestions, please call wife to discuss.

## 2023-02-21 NOTE — Telephone Encounter (Signed)
Called and spoke to pt wife and stated Dr. Epimenio Foot wanted him to go back to 1/2 tab sinimet tid and pt wife voiced understanding

## 2023-04-01 ENCOUNTER — Other Ambulatory Visit (HOSPITAL_COMMUNITY): Payer: Self-pay | Admitting: *Deleted

## 2023-04-01 DIAGNOSIS — R079 Chest pain, unspecified: Secondary | ICD-10-CM

## 2023-04-02 DIAGNOSIS — R201 Hypoesthesia of skin: Secondary | ICD-10-CM | POA: Diagnosis not present

## 2023-04-02 DIAGNOSIS — Z6827 Body mass index (BMI) 27.0-27.9, adult: Secondary | ICD-10-CM | POA: Diagnosis not present

## 2023-04-02 DIAGNOSIS — E663 Overweight: Secondary | ICD-10-CM | POA: Diagnosis not present

## 2023-04-04 ENCOUNTER — Other Ambulatory Visit: Payer: Self-pay

## 2023-04-04 ENCOUNTER — Emergency Department (HOSPITAL_COMMUNITY): Payer: No Typology Code available for payment source

## 2023-04-04 ENCOUNTER — Telehealth (HOSPITAL_COMMUNITY): Payer: Self-pay | Admitting: *Deleted

## 2023-04-04 ENCOUNTER — Encounter (HOSPITAL_COMMUNITY): Payer: Self-pay

## 2023-04-04 ENCOUNTER — Emergency Department (HOSPITAL_COMMUNITY)
Admission: EM | Admit: 2023-04-04 | Discharge: 2023-04-04 | Disposition: A | Discharge status: Home / Self Care | Payer: No Typology Code available for payment source | Attending: Emergency Medicine | Admitting: Emergency Medicine

## 2023-04-04 DIAGNOSIS — Z9889 Other specified postprocedural states: Secondary | ICD-10-CM | POA: Diagnosis not present

## 2023-04-04 DIAGNOSIS — Z6827 Body mass index (BMI) 27.0-27.9, adult: Secondary | ICD-10-CM | POA: Diagnosis not present

## 2023-04-04 DIAGNOSIS — E893 Postprocedural hypopituitarism: Secondary | ICD-10-CM | POA: Diagnosis not present

## 2023-04-04 DIAGNOSIS — R200 Anesthesia of skin: Secondary | ICD-10-CM | POA: Diagnosis not present

## 2023-04-04 DIAGNOSIS — E538 Deficiency of other specified B group vitamins: Secondary | ICD-10-CM | POA: Diagnosis not present

## 2023-04-04 DIAGNOSIS — G90A Postural orthostatic tachycardia syndrome (POTS): Secondary | ICD-10-CM | POA: Diagnosis not present

## 2023-04-04 DIAGNOSIS — B182 Chronic viral hepatitis C: Secondary | ICD-10-CM | POA: Diagnosis not present

## 2023-04-04 LAB — COMPREHENSIVE METABOLIC PANEL
ALT: 11 U/L (ref 0–44)
AST: 25 U/L (ref 15–41)
Albumin: 3.7 g/dL (ref 3.5–5.0)
Alkaline Phosphatase: 57 U/L (ref 38–126)
Anion gap: 8 (ref 5–15)
BUN: 15 mg/dL (ref 8–23)
CO2: 23 mmol/L (ref 22–32)
Calcium: 9.2 mg/dL (ref 8.9–10.3)
Chloride: 109 mmol/L (ref 98–111)
Creatinine, Ser: 1.31 mg/dL — ABNORMAL HIGH (ref 0.61–1.24)
GFR, Estimated: 59 mL/min — ABNORMAL LOW (ref >60)
Glucose, Bld: 105 mg/dL — ABNORMAL HIGH (ref 70–99)
Potassium: 4.1 mmol/L (ref 3.5–5.1)
Sodium: 140 mmol/L (ref 135–145)
Total Bilirubin: 0.8 mg/dL (ref 0.0–1.2)
Total Protein: 6.9 g/dL (ref 6.5–8.1)

## 2023-04-04 LAB — CBC WITH DIFFERENTIAL/PLATELET
Abs Immature Granulocytes: 0.02 10*3/uL (ref 0.00–0.07)
Basophils Absolute: 0 10*3/uL (ref 0.0–0.1)
Basophils Relative: 0 %
Eosinophils Absolute: 0.1 10*3/uL (ref 0.0–0.5)
Eosinophils Relative: 1 %
HCT: 42.3 % (ref 39.0–52.0)
Hemoglobin: 14.2 g/dL (ref 13.0–17.0)
Immature Granulocytes: 0 %
Lymphocytes Relative: 45 %
Lymphs Abs: 2.2 10*3/uL (ref 0.7–4.0)
MCH: 31.1 pg (ref 26.0–34.0)
MCHC: 33.6 g/dL (ref 30.0–36.0)
MCV: 92.8 fL (ref 80.0–100.0)
Monocytes Absolute: 0.3 10*3/uL (ref 0.1–1.0)
Monocytes Relative: 6 %
Neutro Abs: 2.2 10*3/uL (ref 1.7–7.7)
Neutrophils Relative %: 48 %
Platelets: 262 10*3/uL (ref 150–400)
RBC: 4.56 MIL/uL (ref 4.22–5.81)
RDW: 12.2 % (ref 11.5–15.5)
WBC: 4.8 10*3/uL (ref 4.0–10.5)
nRBC: 0 % (ref 0.0–0.2)

## 2023-04-04 NOTE — ED Provider Notes (Signed)
 [place] EMERGENCY DEPARTMENT AT [facility] Provider Note   CSN: [id number] Arrival date & time: 04/04/23  2956     History  Chief Complaint  Patient presents with   lip numbness   Chest Pain    Bobby York is a 68 y.o. male.  Patient with history of Parkinsons, pituitary tumor status post resection in January 2024, substance abuse, chronic hepatitis C presents today with complaints of lip numbness. He states that same began Thursday morning and has been persistent since then. Numbness is isolated to the left side of his upper lip. Denies history of similar symptoms previously. Did have an MRI with contrast of his brain on Tuesday of last week as a routine scan ordered by neurosurgery given his pituitary resection a year ago.  He has not gotten the results of this back yet.  He went to his primary care doctor and was told to come here to rule out a stroke due to his symptoms.  He denies any headaches, weakness, vision changes.  He is able to eat and drink and speak without issue and denies any weakness to his upper lip.   The history is provided by the patient. No language interpreter was used.  Chest Pain Associated symptoms: numbness (isolated to left upper lip)        Home Medications Prior to Admission medications   Medication Sig Start Date End Date Taking? Authorizing Provider  carbidopa -levodopa  (SINEMET  IR) 25-100 MG tablet Take 0.5 tablets by mouth 3 (three) times daily. 02/21/23  Yes Sater, Sherida Dimmer, MD  Cholecalciferol 125 MCG (5000 UT) TABS Take 5,000 Units by mouth daily.   Yes [provider]  citalopram  (CELEXA ) 10 MG tablet Take 1 tablet (10 mg total) by mouth daily. Patient taking differently: Take 10 mg by mouth daily as needed. 01/07/23  Yes Cassandra Cleveland, MD  Omega-3 Fatty Acids (FISH OIL OMEGA-3 PO) Take 1 tablet by mouth daily.   Yes [provider]  pantoprazole  (PROTONIX ) 40 MG tablet Take 40 mg by mouth daily.   Yes  [provider]  traZODone  (DESYREL ) 50 MG tablet Take 25 mg by mouth at bedtime. 04/30/20  Yes [provider]  predniSONE (STERAPRED UNI-PAK 21 TAB) 10 MG (21) TBPK tablet Take 10 mg by mouth See admin instructions. Take as directed on package for 6 days Patient not taking: Reported on 04/04/2023 04/02/23   [provider]      Allergies    Atorvastatin    Review of Systems   Review of Systems  Neurological:  Positive for numbness (isolated to left upper lip).  All other systems reviewed and are negative.   Physical Exam Updated Vital Signs BP (!) 149/111   Pulse 83   Temp 98.4 F (36.9 C)   Resp 20   SpO2 97%  Physical Exam Vitals and nursing note reviewed.  Constitutional:      General: He is not in acute distress.    Appearance: Normal appearance. He is normal weight. He is not ill-appearing, toxic-appearing or diaphoretic.  HENT:     Head: Normocephalic and atraumatic.  Cardiovascular:     Rate and Rhythm: Normal rate and regular rhythm.     Heart sounds: Normal heart sounds.  Pulmonary:     Effort: Pulmonary effort is normal. No respiratory distress.     Breath sounds: Normal breath sounds.  Abdominal:     Palpations: Abdomen is soft.     Tenderness: There is no  abdominal tenderness.  Musculoskeletal:        General: Normal range of motion.     Cervical back: Normal range of motion.     Right lower leg: No tenderness. No edema.     Left lower leg: No tenderness. No edema.  Skin:    General: Skin is warm and dry.  Neurological:     General: No focal deficit present.     Mental Status: He is alert and oriented to person, place, and time.     GCS: GCS eye subscore is 4. GCS verbal subscore is 5. GCS motor subscore is 6.     Sensory: Sensation is intact.     Motor: Motor function is intact.     Coordination: Coordination is intact.     Gait: Gait is intact.     Comments: Alert and oriented to self, place, time and event.    Speech is  fluent, clear without dysarthria or dysphasia.    Strength 5/5 in upper/lower extremities   Sensation intact in upper/lower extremities   Normal finger to nose testing   CN I not tested  CN II grossly intact visual fields bilaterally. Did not visualize posterior eye.  CN III, IV, VI PERRLA and EOMs intact bilaterally  CN V Intact sensation to sharp and light touch to the face. Does have subjective numbness isolated to the left upper lip area lateral to the philtrum extending to the nasolabial fold. Normal sensation to the left face beyond the nasolabial fold. Full ROM noted to the facial muscles. Able to lift his upper lip without issue. No facial droop CN VII facial movements symmetric  CN VIII not tested  CN IX, X no uvula deviation, symmetric rise of soft palate  CN XI 5/5 SCM and trapezius strength bilaterally  CN XII Midline tongue protrusion, symmetric L/R movements   Psychiatric:        Mood and Affect: Mood normal.        Behavior: Behavior normal.     ED Results / Procedures / Treatments   Labs (all labs ordered are listed, but only abnormal results are displayed) Labs Reviewed  COMPREHENSIVE METABOLIC PANEL - Abnormal; Notable for the following components:      Result Value   Glucose, Bld 105 (*)    Creatinine, Ser 1.31 (*)    GFR, Estimated 59 (*)    All other components within normal limits  CBC WITH DIFFERENTIAL/PLATELET    EKG EKG Interpretation Date/Time:  Monday April 04 2023 10:19:05 EST Ventricular Rate:  86 PR Interval:  158 QRS Duration:  78 QT Interval:  384 QTC Calculation: 459 R Axis:   98  Text Interpretation: Normal sinus rhythm Rightward axis Nonspecific ST abnormality Abnormal ECG No significant change since last tracing Confirmed by Celesta Coke ([id number]) on 04/04/2023 10:22:27 AM  Radiology CT Head Wo Contrast Result Date: 04/04/2023 CLINICAL DATA:  Neuro deficit, acute, stroke suspected. Lip numbness. EXAM: CT HEAD WITHOUT CONTRAST  TECHNIQUE: Contiguous axial images were obtained from the base of the skull through the vertex without intravenous contrast. RADIATION DOSE REDUCTION: This exam was performed according to the departmental dose-optimization program which includes automated exposure control, adjustment of the mA and/or kV according to patient size and/or use of iterative reconstruction technique. COMPARISON:  Head CT 10/21/2022 FINDINGS: Brain: There is no evidence of an acute infarct, intracranial hemorrhage, midline shift, or extra-axial fluid collection. Cerebral volume is normal. The ventricles are normal in size. Sequelae of trans-sphenoidal pituitary resection  are again noted, not evaluated in detail on this routine noncontrast CT. Vascular: Calcified atherosclerosis at the skull base. No hyperdense vessel. Skull: No acute fracture or suspicious osseous lesion. Sinuses/Orbits: Postoperative changes involving the sphenoid sinuses and posterior nasal cavity. No evidence of acute inflammatory disease in the included portions of the paranasal sinuses. Clear mastoid air cells. Unremarkable orbits. Other: None. IMPRESSION: No evidence of acute intracranial abnormality. Electronically Signed   By: Aundra Lee M.D.   On: 04/04/2023 12:13    Procedures Procedures    Medications Ordered in ED Medications - No data to display  ED Course/ Medical Decision Making/ A&P                                 Medical Decision Making Amount and/or Complexity of Data Reviewed Radiology: ordered.   This patient is a 68 y.o. male who presents to the ED for concern of left upper lip numbness, this involves an extensive number of treatment options, and is a complaint that carries with it a high risk of complications and morbidity. The emergent differential diagnosis prior to evaluation includes, but is not limited to,  CVA, sepsis, hypoglycemia, hypoxia, electrolyte disturbance, endocrine disorder, anemia, environmental exposure,  polypharmacy   This is not an exhaustive differential.   Past Medical History / Co-morbidities / Social History:  has a past medical history of Bipolar 1 disorder (HCC), Dyspnea, Hepatitis, and Substance abuse (HCC) (2021).  Also history of pituitary tumor status postresection in January 2024, history of Parkinson's.  Additional history: Chart reviewed. Pertinent results include: unable to review patients outpatient MRI from Tuesday.  Physical Exam: Physical exam performed. The pertinent findings include: Alert and oriented neurologically intact without focal deficits. Does have subjective numbness isolated to the left upper lip area lateral to the philtrum extending to the nasolabial fold. Normal sensation to the left face beyond the nasolabial fold. Full ROM noted to the facial muscles. Able to lift his upper lip without issue. No facial droop  Lab Tests: I ordered, and personally interpreted labs.  The pertinent results include:  kidney function consistent with previous. No other acute laboratory abnormalities   Imaging Studies: I ordered imaging studies including CT head. I independently visualized and interpreted imaging which showed no acute findings. I agree with the radiologist interpretation.   Cardiac Monitoring:  The patient was maintained on a cardiac monitor.  My attending physician Dr. Nora Beal viewed and interpreted the cardiac monitored which showed an underlying rhythm of: Sinus rhythm, no STEMI, unchanged from previous. I agree with this interpretation.   Disposition: After consideration of the diagnostic results and the patients response to treatment, I feel that emergency department workup does not suggest an emergent condition requiring admission or immediate intervention beyond what has been performed at this time. The plan is: discharge with close outpatient follow-up and return precautions. Patients work-up is benign. Given his numbness is isolated to his left upper  lip area, with no weakness in the area, very low suspicion for CVA and therefore no indication for further evaluation with MRI imaging at this time. Recommend he follow-up the results from his MRI through neurosurgery. Evaluation and diagnostic testing in the emergency department does not suggest an emergent condition requiring admission or immediate intervention beyond what has been performed at this time.  Plan for discharge with close PCP follow-up.  Patient is understanding and amenable with plan, educated on red flag symptoms  that would prompt immediate return.  Patient discharged in stable condition.   I discussed this case with my attending physician Dr. Nora Beal who cosigned this note including patient's presenting symptoms, physical exam, and planned diagnostics and interventions. Attending physician stated agreement with plan or made changes to plan which were implemented.    Final Clinical Impression(s) / ED Diagnoses Final diagnoses:  Lip numbness    Rx / DC Orders ED Discharge Orders     None     An After Visit Summary was printed and given to the patient.     Gwendloyn Forsee A, PA-C 04/04/23 1513    Kingsley, Victoria K, DO 04/04/23 1535

## 2023-04-04 NOTE — ED Provider Triage Note (Signed)
Emergency Medicine Provider Triage Evaluation Note  Bobby York , a 68 y.o. male  was evaluated in triage.  Pt complains of numbness left side of mouth since last Thursday (5 days ago)  Pt scheduled for a stress test tomorrow.  MD advised pt to come in to be seen to make sure he had not had a stroke.  Pt had an out pt MRi last Tuesday  Review of Systems  Positive: Numbness left upper lip,   Negative: No weakness  Physical Exam  BP 97/73 (BP Location: Left Arm)   Pulse 86   Temp 98.4 F (36.9 C)   Resp 18   SpO2 93%  Gen:   Awake, no distress   Resp:  Normal effort  MSK:   Moves extremities without difficulty  Other:    Medical Decision Making  Medically screening exam initiated at 10:06 AM.  Appropriate orders placed.  FLORA RATZ was informed that the remainder of the evaluation will be completed by another provider, this initial triage assessment does not replace that evaluation, and the importance of remaining in the ED until their evaluation is complete.     Elson Areas, [place] 04/04/23 1008

## 2023-04-04 NOTE — ED Notes (Signed)
 Patient transported to CT

## 2023-04-04 NOTE — Discharge Instructions (Addendum)
 As we discussed, your workup in the ER today was reassuring for acute findings.  CT imaging of your head as well as laboratory evaluation did not reveal any emergent cause of your symptoms.  Given the distribution of your symptoms, I have a low suspicion that this is due to any emergent intracranial abnormality such as a stroke.  I recommend that you follow-up the results of your outpatient MRI with your neurosurgeon and follow-up closely with your primary care doctor as well.  Return if development of any new or worsening symptoms.

## 2023-04-04 NOTE — Telephone Encounter (Signed)

## 2023-04-04 NOTE — ED Triage Notes (Signed)
 Pt. Stated, I've had lip numbness since last Wednesday. We were sent here by our Dr. [place]. I do have chest wall pain but that's been awhile.(6 months) Ive had a brain scan for my pituitary. Im scheduled for a stress test with contrast tomorrow on my heart at [place]  .

## 2023-04-05 ENCOUNTER — Telehealth: Payer: Self-pay

## 2023-04-05 ENCOUNTER — Ambulatory Visit (HOSPITAL_COMMUNITY)
Admission: RE | Admit: 2023-04-05 | Discharge: 2023-04-05 | Disposition: A | Discharge status: Home / Self Care | Payer: No Typology Code available for payment source | Source: Ambulatory Visit | Attending: Cardiovascular Disease | Admitting: Cardiovascular Disease

## 2023-04-05 ENCOUNTER — Encounter: Payer: Self-pay | Admitting: Neurology

## 2023-04-05 DIAGNOSIS — R079 Chest pain, unspecified: Secondary | ICD-10-CM | POA: Insufficient documentation

## 2023-04-05 LAB — NM PET CT CARDIAC PERFUSION MULTI W/ABSOLUTE BLOODFLOW
MBFR: 2.6
Nuc Rest EF: 56 %
Nuc Stress EF: 62 %
Rest MBF: 0.86 ml/g/min
Rest Nuclear Isotope Dose: 23.3 mCi
Stress MBF: 2.24 ml/g/min
Stress Nuclear Isotope Dose: 22.7 mCi

## 2023-04-05 MED ORDER — RUBIDIUM RB82 GENERATOR (RUBYFILL)
25.0000 | PACK | Freq: Once | INTRAVENOUS | Status: AC
Start: 2023-04-05 — End: 2023-04-05
  Administered 2023-04-05: 22.7 via INTRAVENOUS

## 2023-04-05 MED ORDER — REGADENOSON 0.4 MG/5ML IV SOLN
INTRAVENOUS | Status: AC
  Filled 2023-04-05: qty 5

## 2023-04-05 MED ORDER — REGADENOSON 0.4 MG/5ML IV SOLN
0.4000 mg | Freq: Once | INTRAVENOUS | Status: AC
  Administered 2023-04-05: 0.4 mg via INTRAVENOUS

## 2023-04-05 MED ORDER — RUBIDIUM RB82 GENERATOR (RUBYFILL)
25.0000 | PACK | Freq: Once | INTRAVENOUS | Status: AC
  Administered 2023-04-05: 23.29 via INTRAVENOUS

## 2023-04-05 NOTE — Telephone Encounter (Signed)
Call too patient who reports after having MRI with contrast he started having numbness on left lip. He has seen PCP and went to ED to rule out stroke/TIA. He states they didn't find anything. He did have a stress test this morning too. He is concerned about the numbness. Advised I would send to Dr. Teresa Coombs for review. Patient appreciative.

## 2023-04-05 NOTE — Telephone Encounter (Signed)
Head CT negative. Continue current meds for now.

## 2023-04-05 NOTE — Progress Notes (Signed)
Messaged Dr. Jerene Pitch prior to [place].  Pt seen in the ED yesterday for dizziness and some facilal numbness that has been happening for over a week.  Upon chart review Dr. Jerene Pitch felt it was okay to proceed with Lexiscan as long as SBP 100 or greater.  After Lexiscan pt remained in room drank coffee as he did have some dizziness and facilal numbness.  He said it was improving he had a driver in the waiting room, and he said he was used to this.  Pt placed in wheelchair and taken to waiting area by Freddie Apley.

## 2023-04-06 NOTE — Telephone Encounter (Signed)
Call to patient, no answer. Left message to return call

## 2023-04-06 NOTE — Telephone Encounter (Signed)
Pt has returned the call to April, RN

## 2023-04-06 NOTE — Telephone Encounter (Signed)
Returned call to patient, he verbalized understanding to continue current medications and and call back with if numbing gets worse

## 2023-04-13 MED ORDER — CARBIDOPA-LEVODOPA 25-100 MG PO TABS: 0.5000 | ORAL_TABLET | Freq: Three times a day (TID) | ORAL | 0 refills | Status: DC

## 2023-04-13 NOTE — Addendum Note (Signed)
Addended by: Berna Spare A on: 04/13/2023 08:43 AM   Modules accepted: Orders

## 2023-04-22 ENCOUNTER — Ambulatory Visit: Payer: No Typology Code available for payment source | Admitting: Neurology

## 2023-04-22 ENCOUNTER — Encounter: Payer: Self-pay | Admitting: Neurology

## 2023-04-22 VITALS — BP 81/52 | HR 90 | Ht 71.0 in | Wt 195.0 lb

## 2023-04-22 DIAGNOSIS — G20B1 Parkinson's disease with dyskinesia, without mention of fluctuations: Secondary | ICD-10-CM

## 2023-04-22 DIAGNOSIS — I951 Orthostatic hypotension: Secondary | ICD-10-CM | POA: Diagnosis not present

## 2023-04-22 MED ORDER — CARBIDOPA-LEVODOPA 25-100 MG PO TABS: 1.0000 | ORAL_TABLET | Freq: Three times a day (TID) | ORAL | 1 refills | Status: DC

## 2023-04-22 NOTE — Patient Instructions (Addendum)
 Increase Sinemet back to 1 tablet 3 times daily Continue your other medications Please use a walker with ambulation Please increase physical activity Follow-up in 1 month or sooner if worse.
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
## 2023-05-04 ENCOUNTER — Telehealth: Payer: Self-pay | Admitting: Neurology

## 2023-05-04 NOTE — Telephone Encounter (Signed)
 Call to patient, advised medications was in Feb. He stated that pharmacy had it on hold but it will be ready later today

## 2023-05-04 NOTE — Telephone Encounter (Signed)
Pt is requesting a refill for carbidopa-levodopa (SINEMET IR) 25-100 MG tablet .  Pharmacy: [id number]  

## 2023-05-20 ENCOUNTER — Encounter: Payer: Self-pay | Admitting: Neurology

## 2023-05-20 ENCOUNTER — Ambulatory Visit: Payer: No Typology Code available for payment source | Admitting: Neurology

## 2023-05-20 VITALS — BP 77/54 | HR 91 | Ht 71.0 in | Wt 194.0 lb

## 2023-05-20 DIAGNOSIS — G20B1 Parkinson's disease with dyskinesia, without mention of fluctuations: Secondary | ICD-10-CM | POA: Diagnosis not present

## 2023-05-20 DIAGNOSIS — I951 Orthostatic hypotension: Secondary | ICD-10-CM

## 2023-05-20 MED ORDER — FLUDROCORTISONE ACETATE 0.1 MG PO TABS: 0.0500 mg | ORAL_TABLET | Freq: Every day | ORAL | 0 refills | Status: DC

## 2023-05-20 MED ORDER — CARBIDOPA-LEVODOPA 25-100 MG PO TABS: 1.0000 | ORAL_TABLET | Freq: Four times a day (QID) | ORAL | 3 refills | Status: DC
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
## 2023-05-20 NOTE — Patient Instructions (Signed)
 Increase Sinemet 25/100 4 times daily, 6 AM, 10 AM, 2 PM and 6 PM Start fludrocortisone 0.05 mg daily and monitor your blood pressure daily Continue your other medications Continue with physical activity Follow-up in 2 months or sooner if worse.

## 2023-06-09 ENCOUNTER — Telehealth: Payer: Self-pay

## 2023-06-09 NOTE — Telephone Encounter (Signed)
 Call to patient, he is in agreement to come on 5/30 at 8:15am

## 2023-06-14 ENCOUNTER — Encounter: Payer: Self-pay | Admitting: Neurology

## 2023-06-14 ENCOUNTER — Telehealth: Payer: Self-pay

## 2023-06-14 NOTE — Telephone Encounter (Signed)
 Call to patient, he states since starting the fludrocortisone  he has been monitoring blood pressure and gave me several readings 05/21/23 - 71/52, 4/3- 102/74, 4/10-101/74, 4/17 116/77, 4/22 60/49. Some of the reading in the evenings are really low. Patient takes the medication in the morning. And BP improves after pill in the morning but then tends to drop in the afternoon evening. Patient and wife have also noticed some weakness in the right hand with tremors. They report having 2 lb hand weights at home and want to make sure that is fine to us  until they can get into physical therapy. Currently need BP up before they will take. He has done well on this medication. Current dose is 0.05mg  daily, should they increase?

## 2023-06-15 ENCOUNTER — Other Ambulatory Visit: Payer: Self-pay | Admitting: Neurology

## 2023-06-15 MED ORDER — FLUDROCORTISONE ACETATE 0.1 MG PO TABS: 0.1000 mg | ORAL_TABLET | Freq: Two times a day (BID) | ORAL | 0 refills | Status: DC

## 2023-06-15 NOTE — Telephone Encounter (Signed)
 Please advised them to increase to 1 tablet twice daily. I have send a new prescription 0.1 mg (increase in dose) to take twice daily. Please advised them to continue monitoring blood pressure and drink plenty of fluids.  Ok to use the 2 lbs weight.

## 2023-06-15 NOTE — Telephone Encounter (Signed)
 Call to patient and wife, reviewed medication increase and recommendation. Both verbalized understanding.  Call to [facility] and reconciled medications. Spoke with melody

## 2023-07-13 ENCOUNTER — Encounter: Payer: Self-pay | Admitting: Neurology

## 2023-07-13 ENCOUNTER — Other Ambulatory Visit: Payer: Self-pay | Admitting: Neurology

## 2023-07-21 ENCOUNTER — Ambulatory Visit: Admitting: Neurology

## 2023-07-22 ENCOUNTER — Ambulatory Visit: Admitting: Neurology

## 2023-07-22 ENCOUNTER — Encounter: Payer: Self-pay | Admitting: Neurology

## 2023-07-22 VITALS — BP 92/64 | HR 98 | Ht 71.0 in | Wt 190.4 lb

## 2023-07-22 DIAGNOSIS — F419 Anxiety disorder, unspecified: Secondary | ICD-10-CM | POA: Diagnosis not present

## 2023-07-22 DIAGNOSIS — R269 Unspecified abnormalities of gait and mobility: Secondary | ICD-10-CM | POA: Diagnosis not present

## 2023-07-22 DIAGNOSIS — G20B1 Parkinson's disease with dyskinesia, without mention of fluctuations: Secondary | ICD-10-CM | POA: Diagnosis not present

## 2023-07-22 DIAGNOSIS — I951 Orthostatic hypotension: Secondary | ICD-10-CM

## 2023-07-22 MED ORDER — FLUDROCORTISONE ACETATE 0.1 MG PO TABS: 100.0000 ug | ORAL_TABLET | Freq: Two times a day (BID) | ORAL | 6 refills | Status: DC

## 2023-07-22 MED ORDER — CITALOPRAM HYDROBROMIDE 20 MG PO TABS: 20.0000 mg | ORAL_TABLET | Freq: Every day | ORAL | 1 refills | Status: DC

## 2023-07-22 NOTE — Patient Instructions (Addendum)
 Continue with Sinemet  25 104 times daily, 6 AM, 10 AM, 2 PM and 6 PM Continue with fludrocortisone  0.1 mg twice daily Increase Celexa  to 20 mg daily Continue your other medications Referral to occupational and physical therapy Follow-up with your PCP regarding the left elbow pain Follow-up with 3 months or sooner if worse. Will refer him to movement disorder specialist Dr. Omar Bibber for medication optimization.
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
## 2023-08-01 DIAGNOSIS — M7702 Medial epicondylitis, left elbow: Secondary | ICD-10-CM | POA: Diagnosis not present

## 2023-08-01 DIAGNOSIS — Z6826 Body mass index (BMI) 26.0-26.9, adult: Secondary | ICD-10-CM | POA: Diagnosis not present

## 2023-08-01 DIAGNOSIS — E663 Overweight: Secondary | ICD-10-CM | POA: Diagnosis not present

## 2023-08-18 ENCOUNTER — Ambulatory Visit (HOSPITAL_COMMUNITY): Admitting: Occupational Therapy

## 2023-08-18 ENCOUNTER — Encounter (HOSPITAL_COMMUNITY): Payer: Self-pay | Admitting: Occupational Therapy

## 2023-08-18 ENCOUNTER — Other Ambulatory Visit: Payer: Self-pay

## 2023-08-18 ENCOUNTER — Ambulatory Visit (HOSPITAL_COMMUNITY): Attending: Neurology

## 2023-08-18 DIAGNOSIS — M6281 Muscle weakness (generalized): Secondary | ICD-10-CM | POA: Diagnosis not present

## 2023-08-18 DIAGNOSIS — R269 Unspecified abnormalities of gait and mobility: Secondary | ICD-10-CM | POA: Diagnosis not present

## 2023-08-18 DIAGNOSIS — R278 Other lack of coordination: Secondary | ICD-10-CM | POA: Insufficient documentation

## 2023-08-18 DIAGNOSIS — Z7409 Other reduced mobility: Secondary | ICD-10-CM | POA: Diagnosis not present

## 2023-08-18 DIAGNOSIS — G20B1 Parkinson's disease with dyskinesia, without mention of fluctuations: Secondary | ICD-10-CM | POA: Diagnosis not present

## 2023-08-18 DIAGNOSIS — R2681 Unsteadiness on feet: Secondary | ICD-10-CM | POA: Diagnosis not present

## 2023-08-18 DIAGNOSIS — R29818 Other symptoms and signs involving the nervous system: Secondary | ICD-10-CM | POA: Insufficient documentation

## 2023-08-18 NOTE — Patient Instructions (Signed)

## 2023-08-18 NOTE — Therapy (Addendum)
 OUTPATIENT PHYSICAL THERAPY NEURO EVALUATION   Patient Name: Bobby York MRN: [id number] DOB:1955/10/05, 68 y.o., male Today's Date: 08/18/2023   PCP: Marvine Rush, MD REFERRING PROVIDER: Gregg Lek, MD  END OF SESSION:  PT End of Session - 08/18/23 1302     Visit Number 1    Number of Visits 1    Authorization Type [employer]    Authorization Time Period No auth    PT Start Time 1304    PT Stop Time 1347    PT Time Calculation (min) 43 min    Activity Tolerance Patient tolerated treatment well;Treatment limited secondary to medical complications (Comment)    Behavior During Therapy [facility] for tasks assessed/performed          Past Medical History:  Diagnosis Date   Bipolar 1 disorder (HCC)    diagnosed 2004   Dyspnea    Hepatitis    Hep C   Substance abuse (HCC) 2021   Past Surgical History:  Procedure Laterality Date   BACK SURGERY     two-lumbar disc x2   CLAVICLE SURGERY     COLONOSCOPY WITH PROPOFOL  N/A 07/25/2014   MFM:dpwhoz rectal polyp otherwise normal . hyperplastic polyp. TCS in 07/2024   CRANIOTOMY N/A 03/12/2022   Procedure: ENDOSCOPIC ENDONASAL TRANSPHENOIDAL RESECTION OF PITUITARY MASS;  Surgeon: Debby Dorn MATSU, MD;  Location: [facility] OR;  Service: Neurosurgery;  Laterality: N/A;   ESOPHAGEAL DILATION N/A 07/25/2014   Procedure: ESOPHAGEAL DILATION;  Surgeon: Lamar CHRISTELLA Hollingshead, MD;  Location: [facility] ORS;  Service: Endoscopy;  Laterality: N/A;  Malony dilators # 56,   ESOPHAGOGASTRODUODENOSCOPY (EGD) WITH PROPOFOL  N/A 07/25/2014   MFM:fpoi erosive reflux/s/p dilator/HH. +hpylor    NECK SURGERY     TRANSPHENOIDAL APPROACH EXPOSURE N/A 03/12/2022   Procedure: TRANSPHENOIDAL ENDOSCOPIC RESCTION OF PITUITARY ADENOMA;  Surgeon: Llewellyn Gerard LABOR, DO;  Location: [facility] OR;  Service: ENT;  Laterality: N/A;   Patient Active Problem List   Diagnosis Date Noted   H/O partial hypophysectomy (HCC) 06/21/2022   Pituitary mass (HCC) 03/12/2022   Pituitary tumor  03/12/2022   DOE (dyspnea on exertion) 08/27/2021   Pneumothorax, traumatic 04/14/2016   H. pylori infection 08/22/2014   Reflux esophagitis    Dysphagia, pharyngoesophageal phase    Mucosal abnormality of stomach    History of colonic polyps    Chronic hepatitis C (HCC) 07/09/2014   Abnormal weight loss 07/09/2014   History of ETOH abuse 07/09/2014   Esophageal dysphagia 07/09/2014   Odynophagia 07/09/2014   Abnormal ECG 05/28/2014    ONSET DATE: August 2025  REFERRING DIAG: G20.B1 (ICD-10-CM) - Parkinson's disease with dyskinesia, unspecified whether manifestations fluctuate (HCC) R26.9 (ICD-10-CM) - Gait abnormality  THERAPY DIAG:  Muscle weakness (generalized)  Unsteadiness on feet  Impaired functional mobility, balance, gait, and endurance  Rationale for Evaluation and Treatment: Rehabilitation  SUBJECTIVE:  SUBJECTIVE STATEMENT: Patient and wife report patient attempted PT at this clinic in December 2024. Reports since patient was here last, patient was evaluated  by cardiologist  who reported nothing was wrong with his heart. Reports primary gave pt pills for his BP. Has been taking them only for about a month now. But hasn't noticed a difference as of yet. Pt reports he feels like he is getting weaker. Reports he is always dizzy. Changes in position or standing too long make it worse.   Pt accompanied by: significant other  PERTINENT HISTORY:  Parkinson's, diagnosis November 2024 (Taking carbidopa /levodopa ) Tennis Elbow Dec grip strength  PAIN:  Are you having pain? Yes: NPRS scale: 8/10 Pain location: L elbow Pain description: Aches, Numbness into hands Aggravating factors: prolonged positions Relieving factors: Rest  PRECAUTIONS: None  RED FLAGS: None   WEIGHT BEARING  RESTRICTIONS: No  FALLS: Has patient fallen in last 6 months? No Had a lot last year but not recently  LIVING ENVIRONMENT: Lives with: lives with their spouse Lives in: House/apartment Stairs: Yes: Internal: 4-5 steps; on right going up and External: 6 steps; can reach both Has following equipment at home: Single point cane  PLOF: Needs assistance with ADLs Just for dressing  PATIENT GOALS: To get strength back, get some normalcy  OBJECTIVE:  Note: Objective measures were completed at Evaluation unless otherwise noted.  DIAGNOSTIC FINDINGS:   COGNITION: Overall cognitive status: Within functional limits for tasks assessed   SENSATION:   COORDINATION:   EDEMA:    MUSCLE TONE:     DTRs:    POSTURE:  LOWER EXTREMITY ROM:     Active  Right Eval Left Eval  Hip flexion    Hip extension    Hip abduction    Hip adduction    Hip internal rotation    Hip external rotation    Knee flexion    Knee extension    Ankle dorsiflexion    Ankle plantarflexion    Ankle inversion    Ankle eversion     (Blank rows = not tested)  LOWER EXTREMITY MMT:    MMT Right Eval Left Eval  Hip flexion 5 5  Hip extension    Hip abduction    Hip adduction    Hip internal rotation    Hip external rotation    Knee flexion 4- 4-  Knee extension 4- 4-  Ankle dorsiflexion 5 5  Ankle plantarflexion    Ankle inversion    Ankle eversion    (Blank rows = not tested)  BED MOBILITY:  Findings: Sit to supine Complete Independence Supine to sit Complete Independence  TRANSFERS: Sit to stand: Complete Independence  Assistive device utilized: None     Stand to sit: Complete Independence  Assistive device utilized: None      RAMP:    CURB:    STAIRS:  GAIT: Findings: Gait Characteristics: step through pattern, decreased arm swing- Right, decreased arm swing- Left, decreased step length- Right, decreased step length- Left, decreased stride length, shuffling, and trunk  flexed, Distance walked: 100, Assistive device utilized:Single point cane, and Level of assistance: Modified independence  FUNCTIONAL TESTS:  5 times sit to stand: 23  PATIENT SURVEYS:  Total ABC score: 1070 / 1600 = 66.9 %  TREATMENT DATE:      PATIENT EDUCATION: Education details: PT evaluation, objective findings, POC, Importance of HEP, Precautions, Clinic policies  Person educated: Patient Education method: Explanation and Demonstration Education comprehension: verbalized understanding and returned demonstration  HOME EXERCISE PROGRAM: Access Code: [email or web address] URL: [email or web address] Date: 08/18/2023 Prepared by: Rosaria Powell-Butler  Exercises - Supine Bridge  - 2 x daily - 7 x weekly - 3 sets - 10 reps - Supine Active Straight Leg Raise  - 2 x daily - 7 x weekly - 3 sets - 10 reps - Supine Ankle Pumps  - 2 x daily - 7 x weekly - 3 sets - 10 reps - Supine Hip Abduction  - 2 x daily - 7 x weekly - 3 sets - 10 reps - Supine Quad Set  - 2 x daily - 7 x weekly - 3 sets - 10 reps - 5 hold  GOALS: Goals reviewed with patient? No  SHORT TERM GOALS: N/A   LONG TERM GOALS: N/A   ASSESSMENT:  CLINICAL IMPRESSION: Patient is a 68 y.o. male who was seen today for physical therapy evaluation and treatment for G20.B1 (ICD-10-CM) - Parkinson's disease with dyskinesia, unspecified whether manifestations fluctuate (HCC) R26.9 (ICD-10-CM) - Gait abnormality. Patient with relevant history of recent Parkinson Diagnosis, November 2024, currently on Carbidopa /Levodopa . Patient attempted treatment at this clinic in December 2024 but was referred back to primary due to unstable vitals. Patient accompanied by wife this session. They report an overall decrease in strength, activity tolerance, constant dizziness, and pain. Blood pressure taken following  ambulation to treatment room was 99/77. After sit to stand test, patient's BP measured 77/46. Patient put into supine position with feet elevated for ~4-5 minutes and BP measured 163/115. Patient remains inappropriate for outpatient PT services at this time. Encouraged to contact primary or referring MD with update. Encouraged to return when vitals/BP are more stable. Patient given simple HEP for LE strengthening in supine position.             OBJECTIVE IMPAIRMENTS: Abnormal gait, cardiopulmonary status limiting activity, decreased activity tolerance, decreased balance, decreased endurance, decreased mobility, difficulty walking, decreased strength, dizziness, impaired perceived functional ability, improper body mechanics, postural dysfunction, and pain.   ACTIVITY LIMITATIONS: carrying, lifting, bending, standing, squatting, stairs, transfers, bed mobility, dressing, and reach over head  PARTICIPATION LIMITATIONS: meal prep, cleaning, laundry, driving, community activity, and yard work  PERSONAL FACTORS: 1-2 comorbidities: PD, altered vitals are also affecting patient's functional outcome.   REHAB POTENTIAL: Poor See assessment  CLINICAL DECISION MAKING: Stable/uncomplicated  EVALUATION COMPLEXITY: Moderate  PLAN:  PT FREQUENCY: 2x/week  PT DURATION: 6 weeks  PLANNED INTERVENTIONS: 97164- PT Re-evaluation, 97110-Therapeutic exercises, 97530- Therapeutic activity, [id number]- Neuromuscular re-education, 97535- Self Care, 02859- Manual therapy, [id number]- Gait training, [phone]- Electrical stimulation (manual), [id number]- Traction (mechanical), 20560 (1-2 muscles), 20561 (3+ muscles)- Dry Needling, Patient/Family education, Balance training, Stair training, Taping, Joint mobilization, Spinal mobilization, Cryotherapy, and Moist heat  PLAN FOR NEXT SESSION: Discharged with supine HEP, not appropriate for PT as of yet, encouraged to return with stable vitals   3:33 PM, 08/18/23 Rosaria Settler, PT,  DPT [facility] [place]

## 2023-08-18 NOTE — Addendum Note (Signed)
 Addended by: LUDGER HEART E on: 08/18/2023 03:57 PM   Modules accepted: Orders

## 2023-08-18 NOTE — Therapy (Signed)
 OUTPATIENT OCCUPATIONAL THERAPY NEURO EVALUATION  Patient Name: Bobby York MRN: [id number] DOB:05-30-1955, 68 y.o., male Today's Date: 08/18/2023    END OF SESSION:  OT End of Session - 08/18/23 1323     Visit Number 1    Number of Visits 4    Date for OT Re-Evaluation 09/17/23    Authorization Type 1) [employer], copay $45 2) [facility]    Authorization Time Period no auth    Progress Note Due on Visit 10    OT Start Time 1349    OT Stop Time 1425    OT Time Calculation (min) 36 min    Activity Tolerance Patient tolerated treatment well    Behavior During Therapy WFL for tasks assessed/performed          Past Medical History:  Diagnosis Date   Bipolar 1 disorder (HCC)    diagnosed 2004   Dyspnea    Hepatitis    Hep C   Substance abuse (HCC) 2021   Past Surgical History:  Procedure Laterality Date   BACK SURGERY     two-lumbar disc x2   CLAVICLE SURGERY     COLONOSCOPY WITH PROPOFOL  N/A 07/25/2014   MFM:dpwhoz rectal polyp otherwise normal . hyperplastic polyp. TCS in 07/2024   CRANIOTOMY N/A 03/12/2022   Procedure: ENDOSCOPIC ENDONASAL TRANSPHENOIDAL RESECTION OF PITUITARY MASS;  Surgeon: Debby Dorn MATSU, MD;  Location: [facility] OR;  Service: Neurosurgery;  Laterality: N/A;   ESOPHAGEAL DILATION N/A 07/25/2014   Procedure: ESOPHAGEAL DILATION;  Surgeon: Lamar CHRISTELLA Hollingshead, MD;  Location: [facility];  Service: Endoscopy;  Laterality: N/A;  Malony dilators # 56,   ESOPHAGOGASTRODUODENOSCOPY (EGD) WITH PROPOFOL  N/A 07/25/2014   MFM:fpoi erosive reflux/s/p dilator/HH. +hpylor    NECK SURGERY     TRANSPHENOIDAL APPROACH EXPOSURE N/A 03/12/2022   Procedure: TRANSPHENOIDAL ENDOSCOPIC RESCTION OF PITUITARY ADENOMA;  Surgeon: Llewellyn Gerard LABOR, DO;  Location: [facility] OR;  Service: ENT;  Laterality: N/A;   Patient Active Problem List   Diagnosis Date Noted   H/O partial hypophysectomy (HCC) 06/21/2022   Pituitary mass (HCC) 03/12/2022   Pituitary tumor 03/12/2022   DOE (dyspnea on  exertion) 08/27/2021   Pneumothorax, traumatic 04/14/2016   H. pylori infection 08/22/2014   Reflux esophagitis    Dysphagia, pharyngoesophageal phase    Mucosal abnormality of stomach    History of colonic polyps    Chronic hepatitis C (HCC) 07/09/2014   Abnormal weight loss 07/09/2014   History of ETOH abuse 07/09/2014   Esophageal dysphagia 07/09/2014   Odynophagia 07/09/2014   Abnormal ECG 05/28/2014   PCP: Dr. Norleen General  REFERRING PROVIDER: Dr. Pastor Falling  ONSET DATE: chronic  REFERRING DIAG: G20.B1 (ICD-10-CM) - Parkinson's disease with dyskinesia, unspecified whether manifestations fluctuate (HCC)   THERAPY DIAG:  Other lack of coordination  Other symptoms and signs involving the nervous system  Rationale for Evaluation and Treatment: Rehabilitation  SUBJECTIVE:   SUBJECTIVE STATEMENT: S: This hand is weak Pt accompanied by: self and significant other  PERTINENT HISTORY: Pt is a 68 y/o male presenting with Parkinson's disease, has resting tremors and decreased grip and pinch strength impacting use of dominant RUE during ADLs.   PRECAUTIONS: Other: BP issues  WEIGHT BEARING RESTRICTIONS: No  PAIN:  Are you having pain? Yes: NPRS scale: 7/10 Pain location: left ulnar side of arm Pain description: aching Aggravating factors: use, sleeping with elbow bent Relieving factors: nothing  FALLS: Has patient fallen in last 6 months? No  PLOF: Independent  PATIENT GOALS: To have more use of the RUE  OBJECTIVE:  Note: Objective measures were completed at Evaluation unless otherwise noted.  HAND DOMINANCE: Right  ADLs: Overall ADLs: Pt is having difficulty with operating buttons, tying shoes, writing, eating. Pt is having difficulty putting socks. Pt reports weakness in both hands.   FUNCTIONAL OUTCOME MEASURES: Quick Dash: 50  UPPER EXTREMITY ROM:    A/ROM of RUE is WNL  UPPER EXTREMITY MMT:     MMT Right eval  Elbow flexion 5/5  Elbow  extension 5/5  Wrist flexion 4-/5  Wrist extension 4/5  Wrist ulnar deviation 3+/5  Wrist radial deviation 3+/5  Wrist pronation 4/5  Wrist supination 4/5  (Blank rows = not tested)  HAND FUNCTION: Grip strength: Right: 10 lbs; Left: 35 lbs, Lateral pinch: Right: 3 lbs, Left: 3 lbs, and 3 point pinch: Right: 3 lbs, Left: 3 lbs  COORDINATION: 9 Hole Peg test: Right: 38.37 sec; Left: 36.41 sec  SENSATION: WFL Subjective moderate to severe tingling/numbness in bilateral hands left>right  EDEMA: none  COGNITION: Overall cognitive status: Within functional limits for tasks assessed  OBSERVATIONS: Pt arrived with brace for lateral epicondylitis on proximal forearm of LUE.                                                                                                                              TREATMENT DATE:  -Pt with brace for lateral epicondylitis on his left forearm. Pt reporting pain at the left medial epicondyle, along with numbness and tingling down the ulnar side of the forearm and into the 4th and 5th digits. Pt reports worsening pain if he tries to sleep with the elbow flexed, or when he removes his brace the pain significantly worsens. Educated pt and wife on recommendation for a referral to an orthopedic MD to assess for possible cubital tunnel syndrome versus lateral epicondylitis. Pt does not exhibit the typical signs of epicondylitis, but fits mold for cubital tunnel syndrome. Also recommend discontinuing brace until ortho appt.         PATIENT EDUCATION: Education details: yellow theraputty for grip/pinch; foam to build up utensils Person educated: Patient and Spouse Education method: Explanation and Handouts Education comprehension: verbalized understanding  HOME EXERCISE PROGRAM: Eval: yellow theraputty for grip/pinch   GOALS: Goals reviewed with patient? Yes  SHORT TERM GOALS: Target date: 09/17/23  Pt will be provided with and educated on HEP to  improve functional use of RUE during ADLs.   Goal status: INITIAL  2.  Pt will be educated on AE available to improve independence and success with tedious ADLs.   Goal status: INITIAL  3.  Pt will increase right grip strength by 15# and pinch strength by 4# to improve ability to grasp and hold items when carrying or moving room to room during ADLs or housework tasks.  Goal status: INITIAL  4.  Pt will increase right hand coordination required for tedious manipulation tasks  by completing 9 hole peg test in under 30   Goal status: INITIAL    ASSESSMENT:  CLINICAL IMPRESSION: Patient is a 68 y.o. male who was seen today for occupational therapy evaluation for difficulty with RUE use and fine motor coordination due to Parkinson's disease. Pt with resting hand tremors and significantly decreased grip and pinch strength in the RUE. Pt also with nerve issue in the LUE, recommended orthopedic follow up to further assess.    PERFORMANCE DEFICITS: in functional skills including ADLs, IADLs, coordination, dexterity, sensation, strength, pain, and UE functional use  IMPAIRMENTS: are limiting patient from ADLs, IADLs, rest and sleep, and leisure.   CO-MORBIDITIES: has no other co-morbidities that affects occupational performance. Patient will benefit from skilled OT to address above impairments and improve overall function.  MODIFICATION OR ASSISTANCE TO COMPLETE EVALUATION: No modification of tasks or assist necessary to complete an evaluation.  OT OCCUPATIONAL PROFILE AND HISTORY: Problem focused assessment: Including review of records relating to presenting problem.  CLINICAL DECISION MAKING: LOW - limited treatment options, no task modification necessary  REHAB POTENTIAL: Good  EVALUATION COMPLEXITY: Low    PLAN:  OT FREQUENCY: 1x/week  OT DURATION: 4 weeks  PLANNED INTERVENTIONS: 02831 OT Re-evaluation, 97535 self care/ADL training, 02889 therapeutic exercise, 97530  therapeutic activity, 97112 neuromuscular re-education, patient/family education, and DME and/or AE instructions  RECOMMENDED OTHER SERVICES: Orthopedic referral to assess LUE  CONSULTED AND AGREED WITH PLAN OF CARE: Patient and family member/caregiver  PLAN FOR NEXT SESSION: Follow up on HEP, begin grip/pinch strengthening and coordination work   [employer], OTR/L  [phone] 08/18/2023, 3:27 PM

## 2023-08-23 DIAGNOSIS — M25522 Pain in left elbow: Secondary | ICD-10-CM | POA: Diagnosis not present

## 2023-08-25 ENCOUNTER — Encounter (HOSPITAL_COMMUNITY): Payer: Self-pay | Admitting: Occupational Therapy

## 2023-08-25 ENCOUNTER — Ambulatory Visit (HOSPITAL_COMMUNITY): Attending: Neurology | Admitting: Occupational Therapy

## 2023-08-25 DIAGNOSIS — R29818 Other symptoms and signs involving the nervous system: Secondary | ICD-10-CM | POA: Insufficient documentation

## 2023-08-25 DIAGNOSIS — R278 Other lack of coordination: Secondary | ICD-10-CM | POA: Insufficient documentation

## 2023-08-25 NOTE — Therapy (Signed)
 OUTPATIENT OCCUPATIONAL THERAPY NEURO TREATMENT NOTE  Patient Name: Bobby York MRN: [id number] DOB:18-Feb-1956, 68 y.o., male Today's Date: 08/25/2023    END OF SESSION:  OT End of Session - 08/25/23 1549     Visit Number 2    Number of Visits 4    Date for OT Re-Evaluation 09/17/23    Authorization Type 1) [employer], copay [place]45 2) [facility]    Authorization Time Period no auth    Progress Note Due on Visit 10    OT Start Time 1358    OT Stop Time 1443    OT Time Calculation (min) 45 min    Activity Tolerance Patient tolerated treatment well    Behavior During Therapy WFL for tasks assessed/performed           Past Medical History:  Diagnosis Date   Bipolar 1 disorder (HCC)    diagnosed 2004   Dyspnea    Hepatitis    Hep C   Substance abuse (HCC) 2021   Past Surgical History:  Procedure Laterality Date   BACK SURGERY     two-lumbar disc x2   CLAVICLE SURGERY     COLONOSCOPY WITH PROPOFOL  N/A 07/25/2014   MFM:dpwhoz rectal polyp otherwise normal . hyperplastic polyp. TCS in 07/2024   CRANIOTOMY N/A 03/12/2022   Procedure: ENDOSCOPIC ENDONASAL TRANSPHENOIDAL RESECTION OF PITUITARY MASS;  Surgeon: Debby Dorn MATSU, MD;  Location: [facility] OR;  Service: Neurosurgery;  Laterality: N/A;   ESOPHAGEAL DILATION N/A 07/25/2014   Procedure: ESOPHAGEAL DILATION;  Surgeon: Lamar CHRISTELLA Hollingshead, MD;  Location: [facility] ORS;  Service: Endoscopy;  Laterality: N/A;  Malony dilators # 56,   ESOPHAGOGASTRODUODENOSCOPY (EGD) WITH PROPOFOL  N/A 07/25/2014   MFM:fpoi erosive reflux/s/p dilator/HH. +hpylor    NECK SURGERY     TRANSPHENOIDAL APPROACH EXPOSURE N/A 03/12/2022   Procedure: TRANSPHENOIDAL ENDOSCOPIC RESCTION OF PITUITARY ADENOMA;  Surgeon: Llewellyn Gerard LABOR, DO;  Location: [facility] OR;  Service: ENT;  Laterality: N/A;   Patient Active Problem List   Diagnosis Date Noted   H/O partial hypophysectomy (HCC) 06/21/2022   Pituitary mass (HCC) 03/12/2022   Pituitary tumor 03/12/2022   DOE (dyspnea on  exertion) 08/27/2021   Pneumothorax, traumatic 04/14/2016   H. pylori infection 08/22/2014   Reflux esophagitis    Dysphagia, pharyngoesophageal phase    Mucosal abnormality of stomach    History of colonic polyps    Chronic hepatitis C (HCC) 07/09/2014   Abnormal weight loss 07/09/2014   History of ETOH abuse 07/09/2014   Esophageal dysphagia 07/09/2014   Odynophagia 07/09/2014   Abnormal ECG 05/28/2014   PCP: Dr. Norleen General  REFERRING PROVIDER: Dr. Pastor Falling  ONSET DATE: chronic  REFERRING DIAG: G20.B1 (ICD-10-CM) - Parkinson's disease with dyskinesia, unspecified whether manifestations fluctuate (HCC)   THERAPY DIAG:  Other lack of coordination  Other symptoms and signs involving the nervous system  Rationale for Evaluation and Treatment: Rehabilitation  SUBJECTIVE:   SUBJECTIVE STATEMENT: S: It's not any better Pt accompanied by: self and significant other  PERTINENT HISTORY: Pt is a 68 y/o male presenting with Parkinson's disease, has resting tremors and decreased grip and pinch strength impacting use of dominant RUE during ADLs.   PRECAUTIONS: Other: BP issues  WEIGHT BEARING RESTRICTIONS: No  PAIN:  Are you having pain? Yes: NPRS scale: 7/10 Pain location: left ulnar side of arm Pain description: aching Aggravating factors: use, sleeping with elbow bent Relieving factors: nothing  FALLS: Has patient fallen in last 6 months? No  PLOF:  Independent  PATIENT GOALS: To have more use of the RUE  OBJECTIVE:  Note: Objective measures were completed at Evaluation unless otherwise noted.  HAND DOMINANCE: Right  ADLs: Overall ADLs: Pt is having difficulty with operating buttons, tying shoes, writing, eating. Pt is having difficulty putting socks. Pt reports weakness in both hands.   FUNCTIONAL OUTCOME MEASURES: Quick Dash: 50  UPPER EXTREMITY ROM:    A/ROM of RUE is WNL  UPPER EXTREMITY MMT:     MMT Right eval  Elbow flexion 5/5  Elbow  extension 5/5  Wrist flexion 4-/5  Wrist extension 4/5  Wrist ulnar deviation 3+/5  Wrist radial deviation 3+/5  Wrist pronation 4/5  Wrist supination 4/5  (Blank rows = not tested)  HAND FUNCTION: Grip strength: Right: 10 lbs; Left: 35 lbs, Lateral pinch: Right: 3 lbs, Left: 3 lbs, and 3 point pinch: Right: 3 lbs, Left: 3 lbs  COORDINATION: 9 Hole Peg test: Right: 38.37 sec; Left: 36.41 sec  SENSATION: WFL Subjective moderate to severe tingling/numbness in bilateral hands left>right  EDEMA: none  COGNITION: Overall cognitive status: Within functional limits for tasks assessed  OBSERVATIONS: Pt arrived with brace for lateral epicondylitis on proximal forearm of LUE.                                                                                                                              TREATMENT DATE:   08/25/23 -Wrist ROM: flexion, extension, ulnar/radial deviation, supination/pronation, x10 -Digit ROM: composite flexion, opposition, finger taps, x10,  unable to abduct -Sponges: 7,6 -Gripper: 7#,5 large beads 8 medium beads, 11# 5 large beads -pinch tree: yellow, red, green resistance clips tripod up and lateral down -Connect four: flip 15 checkers over, hold 3 at a time and place in connect four board -grooved peg board, x15 pegs -3# digiflex x10  08/22/23 -Pt with brace for lateral epicondylitis on his left forearm. Pt reporting pain at the left medial epicondyle, along with numbness and tingling down the ulnar side of the forearm and into the 4th and 5th digits. Pt reports worsening pain if he tries to sleep with the elbow flexed, or when he removes his brace the pain significantly worsens. Educated pt and wife on recommendation for a referral to an orthopedic MD to assess for possible cubital tunnel syndrome versus lateral epicondylitis. Pt does not exhibit the typical signs of epicondylitis, but fits mold for cubital tunnel syndrome. Also recommend discontinuing brace  until ortho appt.     PATIENT EDUCATION: Education details: Wrist and digit ROM Person educated: Patient and Spouse Education method: Explanation and Handouts Education comprehension: verbalized understanding  HOME EXERCISE PROGRAM: Eval: yellow theraputty for grip/pinch 7/3: Wrist and Digit ROM    GOALS: Goals reviewed with patient? Yes  SHORT TERM GOALS: Target date: 09/17/23  Pt will be provided with and educated on HEP to improve functional use of RUE during ADLs.   Goal status: IN PROGRESS  2.  Pt will be educated on AE available to improve independence and success with tedious ADLs.   Goal status: IN PROGRESS  3.  Pt will increase right grip strength by 15# and pinch strength by 4# to improve ability to grasp and hold items when carrying or moving room to room during ADLs or housework tasks.  Goal status: IN PROGRESS  4.  Pt will increase right hand coordination required for tedious manipulation tasks by completing 9 hole peg test in under 30   Goal status: IN PROGRESS    ASSESSMENT:  CLINICAL IMPRESSION: This session, pt continues to demonstrate increased weakness, however he was able to utilize a gripper up to 11# this session with his R and use up to the green resistance clips with increased effort. He had increased difficulty with in hand translation, with both checkers and tiny grooved pegs. OT providing verbal and tactile cuing throughout session for positioning and technique.   PERFORMANCE DEFICITS: in functional skills including ADLs, IADLs, coordination, dexterity, sensation, strength, pain, and UE functional use    PLAN:  OT FREQUENCY: 1x/week  OT DURATION: 4 weeks  PLANNED INTERVENTIONS: 97168 OT Re-evaluation, 97535 self care/ADL training, 02889 therapeutic exercise, 97530 therapeutic activity, 97112 neuromuscular re-education, patient/family education, and DME and/or AE instructions  RECOMMENDED OTHER SERVICES: Orthopedic referral to assess  LUE  CONSULTED AND AGREED WITH PLAN OF CARE: Patient and family member/caregiver  PLAN FOR NEXT SESSION: Follow up on HEP, begin grip/pinch strengthening and coordination work   Valentin Nightingale, OTR/L [phone] 08/25/2023, 3:57 PM

## 2023-08-25 NOTE — Patient Instructions (Signed)
 AROM Exercises   1) Wrist Flexion  Start with wrist at edge of table, palm facing up. With wrist hanging slightly off table, curl wrist upward, and back down.      2) Wrist Extension  Start with wrist at edge of table, palm facing down. With wrist slightly off the edge of the table, curl wrist up and back down.      3) Radial Deviations  Start with forearm flat against a table, wrist hanging slightly off the edge, and palm facing the wall. Bending at the wrist only, and keeping palm facing the wall, bend wrist so fist is pointing towards the floor, back up to start position, and up towards the ceiling. Return to start.        4) WRIST PRONATION  Turn your forearm towards palm face down.  Keep your elbow bent and by the side of your  Body.      5) WRIST SUPINATION  Turn your forearm towards palm face up.  Keep your elbow bent and by the side of your  Body.      *Complete exercises ______ times each, _______ times per day*  Complete each exercise 10-15X, 2-3X/day  1) Towel crunch Place a small towel on a firm table top. Flatten out the towel and then place your hand on one end of it.  Next, flex your fingers 2-5 (index finger through pinky finger) as you pull the towel towards your hand.    2) Digit composite flexion/adduction (make a fist) Hold your hand up as shown. Open and close your hand into a fist and repeat. If you cannot make a full fist, then make a partial fist.    3) Thumb/finger opposition Touch the tip of the thumb to each fingertip one by one. Extend fingers fully after they are touched.      4) Finger Taps Start with the hand flat and fingers slightly spread.  One at a time, starting with the thumb, lift each finger up separately.    5) PIP Joint Blocking Grasp the affected finger, bracing below the middle knuckle, and actively bend the finger as shown.    6) DIP Joint Blocking Grasp the affected finger, bracing below the  last knuckle, and actively bend the finger at the last joint.     7) Digit Abduction/Adduction Hold hand palm down flat on table. Spread your fingers apart and back together.

## 2023-08-31 ENCOUNTER — Encounter (HOSPITAL_COMMUNITY): Payer: Self-pay | Admitting: Occupational Therapy

## 2023-08-31 ENCOUNTER — Ambulatory Visit (HOSPITAL_COMMUNITY): Admitting: Occupational Therapy

## 2023-08-31 DIAGNOSIS — R278 Other lack of coordination: Secondary | ICD-10-CM | POA: Diagnosis not present

## 2023-08-31 DIAGNOSIS — R29818 Other symptoms and signs involving the nervous system: Secondary | ICD-10-CM

## 2023-08-31 NOTE — Therapy (Signed)
 OUTPATIENT OCCUPATIONAL THERAPY NEURO TREATMENT NOTE  Patient Name: Bobby York MRN: [id number] DOB:1955/05/11, 68 y.o., male Today's Date: 08/31/2023    END OF SESSION:  OT End of Session - 08/31/23 1238     Visit Number 3    Number of Visits 4    Date for OT Re-Evaluation 09/17/23    Authorization Type 1) [employer], copay [place]45 2) [facility]    Authorization Time Period no auth    Progress Note Due on Visit 10    OT Start Time 1119    OT Stop Time 1202    OT Time Calculation (min) 43 min    Activity Tolerance Patient tolerated treatment well    Behavior During Therapy WFL for tasks assessed/performed          Past Medical History:  Diagnosis Date   Bipolar 1 disorder (HCC)    diagnosed 2004   Dyspnea    Hepatitis    Hep C   Substance abuse (HCC) 2021   Past Surgical History:  Procedure Laterality Date   BACK SURGERY     two-lumbar disc x2   CLAVICLE SURGERY     COLONOSCOPY WITH PROPOFOL  N/A 07/25/2014   MFM:dpwhoz rectal polyp otherwise normal . hyperplastic polyp. TCS in 07/2024   CRANIOTOMY N/A 03/12/2022   Procedure: ENDOSCOPIC ENDONASAL TRANSPHENOIDAL RESECTION OF PITUITARY MASS;  Surgeon: Debby Dorn MATSU, MD;  Location: [facility] OR;  Service: Neurosurgery;  Laterality: N/A;   ESOPHAGEAL DILATION N/A 07/25/2014   Procedure: ESOPHAGEAL DILATION;  Surgeon: Lamar CHRISTELLA Hollingshead, MD;  Location: [facility] OR[facility];  Service: Endoscopy;  Laterality: N/A;  Malony dilators # 56,   ESOPHAGOGASTRODUODENOSCOPY (EGD) WITH PROPOFOL  N/A 07/25/2014   MFM:fpoi erosive reflux/s/p dilator/HH. +hpylor    NECK SURGERY     TRANSPHENOIDAL APPROACH EXPOSURE N/A 03/12/2022   Procedure: TRANSPHENOIDAL ENDOSCOPIC RESCTION OF PITUITARY ADENOMA;  Surgeon: Llewellyn Gerard LABOR, DO;  Location: [facility] OR;  Service: ENT;  Laterality: N/A;   Patient Active Problem List   Diagnosis Date Noted   H/O partial hypophysectomy (HCC) 06/21/2022   Pituitary mass (HCC) 03/12/2022   Pituitary tumor 03/12/2022   DOE (dyspnea on  exertion) 08/27/2021   Pneumothorax, traumatic 04/14/2016   H. pylori infection 08/22/2014   Reflux esophagitis    Dysphagia, pharyngoesophageal phase    Mucosal abnormality of stomach    History of colonic polyps    Chronic hepatitis C (HCC) 07/09/2014   Abnormal weight loss 07/09/2014   History of ETOH abuse 07/09/2014   Esophageal dysphagia 07/09/2014   Odynophagia 07/09/2014   Abnormal ECG 05/28/2014   PCP: Dr. Norleen General  REFERRING PROVIDER: Dr. Pastor Falling  ONSET DATE: chronic  REFERRING DIAG: G20.B1 (ICD-10-CM) - Parkinson's disease with dyskinesia, unspecified whether manifestations fluctuate (HCC)   THERAPY DIAG:  Other lack of coordination  Other symptoms and signs involving the nervous system  Rationale for Evaluation and Treatment: Rehabilitation  SUBJECTIVE:   SUBJECTIVE STATEMENT: S: The arm hurts and it's been numb all day Pt accompanied by: self and significant other  PERTINENT HISTORY: Pt is a 68 y/o male presenting with Parkinson's disease, has resting tremors and decreased grip and pinch strength impacting use of dominant RUE during ADLs.   PRECAUTIONS: Other: BP issues  WEIGHT BEARING RESTRICTIONS: No  PAIN:  Are you having pain? Yes: NPRS scale: 8/10 Pain location: left ulnar side of arm Pain description: aching Aggravating factors: use, sleeping with elbow bent Relieving factors: nothing  FALLS: Has patient fallen in last 6  months? No  PLOF: Independent  PATIENT GOALS: To have more use of the RUE  OBJECTIVE:  Note: Objective measures were completed at Evaluation unless otherwise noted.  HAND DOMINANCE: Right  ADLs: Overall ADLs: Pt is having difficulty with operating buttons, tying shoes, writing, eating. Pt is having difficulty putting socks. Pt reports weakness in both hands.   FUNCTIONAL OUTCOME MEASURES: Quick Dash: 50  UPPER EXTREMITY ROM:    A/ROM of RUE is WNL  UPPER EXTREMITY MMT:     MMT Right eval  Elbow  flexion 5/5  Elbow extension 5/5  Wrist flexion 4-/5  Wrist extension 4/5  Wrist ulnar deviation 3+/5  Wrist radial deviation 3+/5  Wrist pronation 4/5  Wrist supination 4/5  (Blank rows = not tested)  HAND FUNCTION: Grip strength: Right: 10 lbs; Left: 35 lbs, Lateral pinch: Right: 3 lbs, Left: 3 lbs, and 3 point pinch: Right: 3 lbs, Left: 3 lbs  COORDINATION: 9 Hole Peg test: Right: 38.37 sec; Left: 36.41 sec  SENSATION: WFL Subjective moderate to severe tingling/numbness in bilateral hands left>right  EDEMA: none  COGNITION: Overall cognitive status: Within functional limits for tasks assessed  OBSERVATIONS: Pt arrived with brace for lateral epicondylitis on proximal forearm of LUE.                                                                                                                              TREATMENT DATE:   08/31/23 -Wrist ROM: 1#, on half dowel, flexion, extension, ulnar/radial deviation, supination/pronation, x10 -green weighted ball - ABC in the air - dropped ball at H, S -Pinch strengthening - pick up and stack 6 cubes - yellow resistance clip, attempted red, unable to pinch open enough -Yellow theraputty: roll into a ball, flatten into pancake, roll into log, tripod pinch x10, lateral pinch x10, roll into a ball, squeeze x10 -Coins: 10 pennies, 3 nickels, 2 dimes, holding all in hand and placing them 1 at a time in piggy bank.  -Digit ROM: composite flexion, opposition, finger taps, x10,  unable to abduct -Gripper: 11# 6 medium beads  08/25/23 -Wrist ROM: flexion, extension, ulnar/radial deviation, supination/pronation, x10 -Digit ROM: composite flexion, opposition, finger taps, x10,  unable to abduct -Sponges: 7,6 -Gripper: 7#,5 large beads 8 medium beads, 11# 5 large beads -pinch tree: yellow, red, green resistance clips tripod up and lateral down -Connect four: flip 15 checkers over, hold 3 at a time and place in connect four board -grooved peg  board, x15 pegs -3# digiflex x10  08/22/23 -Pt with brace for lateral epicondylitis on his left forearm. Pt reporting pain at the left medial epicondyle, along with numbness and tingling down the ulnar side of the forearm and into the 4th and 5th digits. Pt reports worsening pain if he tries to sleep with the elbow flexed, or when he removes his brace the pain significantly worsens. Educated pt and wife on recommendation for a referral to an orthopedic MD to  assess for possible cubital tunnel syndrome versus lateral epicondylitis. Pt does not exhibit the typical signs of epicondylitis, but fits mold for cubital tunnel syndrome. Also recommend discontinuing brace until ortho appt.     PATIENT EDUCATION: Education details: Wrist and digit ROM Person educated: Patient and Spouse Education method: Explanation and Handouts Education comprehension: verbalized understanding  HOME EXERCISE PROGRAM: Eval: yellow theraputty for grip/pinch 7/3: Wrist and Digit ROM    GOALS: Goals reviewed with patient? Yes  SHORT TERM GOALS: Target date: 09/17/23  Pt will be provided with and educated on HEP to improve functional use of RUE during ADLs.   Goal status: IN PROGRESS  2.  Pt will be educated on AE available to improve independence and success with tedious ADLs.   Goal status: IN PROGRESS  3.  Pt will increase right grip strength by 15# and pinch strength by 4# to improve ability to grasp and hold items when carrying or moving room to room during ADLs or housework tasks.  Goal status: IN PROGRESS  4.  Pt will increase right hand coordination required for tedious manipulation tasks by completing 9 hole peg test in under 30   Goal status: IN PROGRESS    ASSESSMENT:  CLINICAL IMPRESSION: Pt was able to tolerate increased exercises this session with decreased fatigue. He completed strengthening exercises and fine motor tasks. This session pt demonstrated improved in hand translation and  fine motor skills. OT providing verbal and tactile cuing for positioning and technique.   PERFORMANCE DEFICITS: in functional skills including ADLs, IADLs, coordination, dexterity, sensation, strength, pain, and UE functional use    PLAN:  OT FREQUENCY: 1x/week  OT DURATION: 4 weeks  PLANNED INTERVENTIONS: 97168 OT Re-evaluation, 97535 self care/ADL training, 02889 therapeutic exercise, 97530 therapeutic activity, 97112 neuromuscular re-education, patient/family education, and DME and/or AE instructions  RECOMMENDED OTHER SERVICES: Orthopedic referral to assess LUE  CONSULTED AND AGREED WITH PLAN OF CARE: Patient and family member/caregiver  PLAN FOR NEXT SESSION: Follow up on HEP, begin grip/pinch strengthening and coordination work   Valentin Nightingale, OTR/L [phone] 08/31/2023, 12:40 PM

## 2023-09-01 DIAGNOSIS — G5622 Lesion of ulnar nerve, left upper limb: Secondary | ICD-10-CM | POA: Diagnosis not present

## 2023-09-08 ENCOUNTER — Encounter (HOSPITAL_COMMUNITY): Payer: Self-pay | Admitting: Occupational Therapy

## 2023-09-08 ENCOUNTER — Ambulatory Visit (HOSPITAL_COMMUNITY): Admitting: Occupational Therapy

## 2023-09-08 DIAGNOSIS — R29818 Other symptoms and signs involving the nervous system: Secondary | ICD-10-CM

## 2023-09-08 DIAGNOSIS — R278 Other lack of coordination: Secondary | ICD-10-CM | POA: Diagnosis not present

## 2023-09-08 NOTE — Therapy (Signed)
 OUTPATIENT OCCUPATIONAL THERAPY NEURO TREATMENT NOTE  REASSESSMENT/RECERTIFICATION  Patient Name: Bobby York MRN: [id number] DOB:[birth year], 68 y.o., male Today's Date: 09/08/2023  Progress Note Reporting Period 08/18/23 to 09/08/23  See note below for Objective Data and Assessment of Progress/Goals.    END OF SESSION:  OT End of Session - 09/08/23 1116     Visit Number 4    Number of Visits 8    Date for OT Re-Evaluation 10/07/23    Authorization Type 1) [employer], copay [place]45 2) [facility]    Authorization Time Period no auth    Progress Note Due on Visit 10    OT Start Time 1035    OT Stop Time 1116    OT Time Calculation (min) 41 min    Activity Tolerance Patient tolerated treatment well    Behavior During Therapy WFL for tasks assessed/performed           Past Medical History:  Diagnosis Date   Bipolar 1 disorder (HCC)    diagnosed 2004   Dyspnea    Hepatitis    Hep C   Substance abuse (HCC) 2021   Past Surgical History:  Procedure Laterality Date   BACK SURGERY     two-lumbar disc x2   CLAVICLE SURGERY     COLONOSCOPY WITH PROPOFOL  N/A 07/25/2014   MFM:dpwhoz rectal polyp otherwise normal . hyperplastic polyp. TCS in 07/2024   CRANIOTOMY N/A 03/12/2022   Procedure: ENDOSCOPIC ENDONASAL TRANSPHENOIDAL RESECTION OF PITUITARY MASS;  Surgeon: Debby Dorn MATSU, MD;  Location: [facility] OR;  Service: Neurosurgery;  Laterality: N/A;   ESOPHAGEAL DILATION N/A 07/25/2014   Procedure: ESOPHAGEAL DILATION;  Surgeon: Lamar CHRISTELLA Hollingshead, MD;  Location: [facility] ORS;  Service: Endoscopy;  Laterality: N/A;  Malony dilators # 56,   ESOPHAGOGASTRODUODENOSCOPY (EGD) WITH PROPOFOL  N/A 07/25/2014   MFM:fpoi erosive reflux/s/p dilator/HH. +hpylor    NECK SURGERY     TRANSPHENOIDAL APPROACH EXPOSURE N/A 03/12/2022   Procedure: TRANSPHENOIDAL ENDOSCOPIC RESCTION OF PITUITARY ADENOMA;  Surgeon: Llewellyn Gerard LABOR, DO;  Location: [facility] OR;  Service: ENT;  Laterality: N/A;   Patient Active Problem List    Diagnosis Date Noted   H/O partial hypophysectomy (HCC) 06/21/2022   Pituitary mass (HCC) 03/12/2022   Pituitary tumor 03/12/2022   DOE (dyspnea on exertion) 08/27/2021   Pneumothorax, traumatic 04/14/2016   H. pylori infection 08/22/2014   Reflux esophagitis    Dysphagia, pharyngoesophageal phase    Mucosal abnormality of stomach    History of colonic polyps    Chronic hepatitis C (HCC) 07/09/2014   Abnormal weight loss 07/09/2014   History of ETOH abuse 07/09/2014   Esophageal dysphagia 07/09/2014   Odynophagia 07/09/2014   Abnormal ECG 05/28/2014   PCP: Dr. Norleen General  REFERRING PROVIDER: Dr. Pastor Falling  ONSET DATE: chronic  REFERRING DIAG: G20.B1 (ICD-10-CM) - Parkinson's disease with dyskinesia, unspecified whether manifestations fluctuate (HCC)   THERAPY DIAG:  Other lack of coordination  Other symptoms and signs involving the nervous system  Rationale for Evaluation and Treatment: Rehabilitation  SUBJECTIVE:   SUBJECTIVE STATEMENT: S: I can't do anything with my hand.  Pt accompanied by: self and significant other  PERTINENT HISTORY: Pt is a 68 y/o male presenting with Parkinson's disease, has resting tremors and decreased grip and pinch strength impacting use of dominant RUE during ADLs.   PRECAUTIONS: Other: BP issues  WEIGHT BEARING RESTRICTIONS: No  PAIN:  Are you having pain? Yes: NPRS scale: 9/10 Pain location: left ulnar side of arm  Pain description: aching Aggravating factors: use, sleeping with elbow bent Relieving factors: nothing  FALLS: Has patient fallen in last 6 months? No  PLOF: Independent  PATIENT GOALS: To have more use of the RUE  OBJECTIVE:  Note: Objective measures were completed at Evaluation unless otherwise noted.  HAND DOMINANCE: Right  ADLs: Overall ADLs: Pt is having difficulty with operating buttons, tying shoes, writing, eating. Pt is having difficulty putting socks. Pt reports weakness in both hands.    FUNCTIONAL OUTCOME MEASURES: Quick Dash: 50 09/08/23: 56.82  UPPER EXTREMITY ROM:    A/ROM of RUE is WNL  UPPER EXTREMITY MMT:     MMT Right eval Right 09/08/23  Elbow flexion 5/5 5/5  Elbow extension 5/5 5/5  Wrist flexion 4-/5 5/5  Wrist extension 4/5 4/5  Wrist ulnar deviation 3+/5 4/5  Wrist radial deviation 3+/5 4/5  Wrist pronation 4/5 5/5  Wrist supination 4/5 5/5  (Blank rows = not tested)  HAND FUNCTION: Grip strength: Right: 10 lbs; Left: 35 lbs, Lateral pinch: Right: 3 lbs, Left: 3 lbs, and 3 point pinch: Right: 3 lbs, Left: 3 lbs 09/08/23: Grip strength: Right: 20 lbs; Lateral pinch: Right: 3 lbs, and 3 point pinch: Right: 3 lbs  COORDINATION: 9 Hole Peg test: Right: 38.37 sec; Left: 36.41 sec 09/08/23: 9 Hole Peg test: Right: 1 min 31 sec  SENSATION: WFL Subjective moderate to severe tingling/numbness in bilateral hands left>right  EDEMA: none  COGNITION: Overall cognitive status: Within functional limits for tasks assessed  OBSERVATIONS: Pt arrived with brace for lateral epicondylitis on proximal forearm of LUE.                                                                                                                              TREATMENT DATE:   09/08/23 -Reassessment for Recertification -Digit ROM: composite flexion, opposition, finger taps, x10,  unable to abduct -Gripper: 15# 8 medium beads, 18# 110 squeezes (unable to fully close) -Sponges: 23, 23 -Theraputty: red putty, roll into ball, flatten into pancake, roll into log, tripod pinch x10, lateral pinch x10, roll into ball, squeeze x10  08/31/23 -Wrist ROM: 1#, on half dowel, flexion, extension, ulnar/radial deviation, supination/pronation, x10 -green weighted ball - ABC in the air - dropped ball at H, S -Pinch strengthening - pick up and stack 6 cubes - yellow resistance clip, attempted red, unable to pinch open enough -Yellow theraputty: roll into a ball, flatten into pancake, roll  into log, tripod pinch x10, lateral pinch x10, roll into a ball, squeeze x10 -Coins: 10 pennies, 3 nickels, 2 dimes, holding all in hand and placing them 1 at a time in piggy bank.  -Digit ROM: composite flexion, opposition, finger taps, x10,  unable to abduct -Gripper: 11# 6 medium beads  08/25/23 -Wrist ROM: flexion, extension, ulnar/radial deviation, supination/pronation, x10 -Digit ROM: composite flexion, opposition, finger taps, x10,  unable to abduct -Sponges: 7,6 -Gripper: 7#,5 large beads 8 medium beads, 11#  5 large beads -pinch tree: yellow, red, green resistance clips tripod up and lateral down -Connect four: flip 15 checkers over, hold 3 at a time and place in connect four board -grooved peg board, x15 pegs -3# digiflex x10  08/22/23 -Pt with brace for lateral epicondylitis on his left forearm. Pt reporting pain at the left medial epicondyle, along with numbness and tingling down the ulnar side of the forearm and into the 4th and 5th digits. Pt reports worsening pain if he tries to sleep with the elbow flexed, or when he removes his brace the pain significantly worsens. Educated pt and wife on recommendation for a referral to an orthopedic MD to assess for possible cubital tunnel syndrome versus lateral epicondylitis. Pt does not exhibit the typical signs of epicondylitis, but fits mold for cubital tunnel syndrome. Also recommend discontinuing brace until ortho appt.     PATIENT EDUCATION: Education details: Wrist and digit ROM Person educated: Patient and Spouse Education method: Explanation and Handouts Education comprehension: verbalized understanding  HOME EXERCISE PROGRAM: Eval: yellow theraputty for grip/pinch 7/3: Wrist and Digit ROM    GOALS: Goals reviewed with patient? Yes  SHORT TERM GOALS: Target date: 09/17/23  Pt will be provided with and educated on HEP to improve functional use of RUE during ADLs.   Goal status: IN PROGRESS  2.  Pt will be educated on  AE available to improve independence and success with tedious ADLs.   Goal status: IN PROGRESS  3.  Pt will increase right grip strength by 15# and pinch strength by 4# to improve ability to grasp and hold items when carrying or moving room to room during ADLs or housework tasks.  Goal status: IN PROGRESS  4.  Pt will increase right hand coordination required for tedious manipulation tasks by completing 9 hole peg test in under 30   Goal status: IN PROGRESS    ASSESSMENT:  CLINICAL IMPRESSION: Pt completed reassessment for recertification. He demonstrated overall improvement in strength, both of the wrist and elbow, as well as grip strengthening. His coordination has decreased, despite offering a second trial of the 9 hole peg test. Pt also complaining of increased pain in his Left elbow with a nerve conduction study on 09/09/23. Remainder of session pt continuing to work on Geneticist, molecular and mobility. OT providing verbal and tactile cuing for positioning and technique.   PERFORMANCE DEFICITS: in functional skills including ADLs, IADLs, coordination, dexterity, sensation, strength, pain, and UE functional use    PLAN:  OT FREQUENCY: 1x/week  OT DURATION: 4 weeks  PLANNED INTERVENTIONS: 97168 OT Re-evaluation, 97535 self care/ADL training, 02889 therapeutic exercise, 97530 therapeutic activity, 97112 neuromuscular re-education, patient/family education, and DME and/or AE instructions  RECOMMENDED OTHER SERVICES: Orthopedic referral to assess LUE  CONSULTED AND AGREED WITH PLAN OF CARE: Patient and family member/caregiver  PLAN FOR NEXT SESSION: Follow up on HEP, begin grip/pinch strengthening and coordination work   Valentin Nightingale, OTR/L [phone] 09/08/2023, 3:19 PM

## 2023-09-09 DIAGNOSIS — M5412 Radiculopathy, cervical region: Secondary | ICD-10-CM | POA: Diagnosis not present

## 2023-09-15 ENCOUNTER — Encounter (HOSPITAL_COMMUNITY): Admitting: Occupational Therapy

## 2023-09-15 DIAGNOSIS — M5412 Radiculopathy, cervical region: Secondary | ICD-10-CM | POA: Diagnosis not present

## 2023-09-16 ENCOUNTER — Ambulatory Visit (HOSPITAL_COMMUNITY): Admitting: Occupational Therapy

## 2023-09-16 ENCOUNTER — Encounter (HOSPITAL_COMMUNITY): Payer: Self-pay | Admitting: Occupational Therapy

## 2023-09-16 DIAGNOSIS — R29818 Other symptoms and signs involving the nervous system: Secondary | ICD-10-CM | POA: Diagnosis not present

## 2023-09-16 DIAGNOSIS — R278 Other lack of coordination: Secondary | ICD-10-CM

## 2023-09-16 NOTE — Therapy (Signed)
 OUTPATIENT OCCUPATIONAL THERAPY NEURO TREATMENT NOTE    Patient Name: Bobby York MRN: [id number] DOB:[birth year], 68 y.o., male Today's Date: 09/16/2023   END OF SESSION:    09/16/23 1204  OT Visits / Re-Eval  Visit Number 5  Number of Visits 8  Date for OT Re-Evaluation 10/07/23  Authorization  Authorization Type 1) [employer], copay $45 2) [facility]  Authorization Time Period no auth  Progress Note Due on Visit 10  OT Time Calculation  OT Start Time 1121  OT Stop Time 1204  OT Time Calculation (min) 43 min  End of Session  Activity Tolerance Patient tolerated treatment well  Behavior During Therapy WFL for tasks assessed/performed     Past Medical History:  Diagnosis Date   Bipolar 1 disorder (HCC)    diagnosed 2004   Dyspnea    Hepatitis    Hep C   Substance abuse (HCC) 2021   Past Surgical History:  Procedure Laterality Date   BACK SURGERY     two-lumbar disc x2   CLAVICLE SURGERY     COLONOSCOPY WITH PROPOFOL  N/A 07/25/2014   MFM:dpwhoz rectal polyp otherwise normal . hyperplastic polyp. TCS in 07/2024   CRANIOTOMY N/A 03/12/2022   Procedure: ENDOSCOPIC ENDONASAL TRANSPHENOIDAL RESECTION OF PITUITARY MASS;  Surgeon: Debby Dorn MATSU, MD;  Location: [facility] OR;  Service: Neurosurgery;  Laterality: N/A;   ESOPHAGEAL DILATION N/A 07/25/2014   Procedure: ESOPHAGEAL DILATION;  Surgeon: Lamar CHRISTELLA Hollingshead, MD;  Location: [facility] ORS;  Service: Endoscopy;  Laterality: N/A;  Malony dilators # 56,   ESOPHAGOGASTRODUODENOSCOPY (EGD) WITH PROPOFOL  N/A 07/25/2014   MFM:fpoi erosive reflux/s/p dilator/HH. +hpylor    NECK SURGERY     TRANSPHENOIDAL APPROACH EXPOSURE N/A 03/12/2022   Procedure: TRANSPHENOIDAL ENDOSCOPIC RESCTION OF PITUITARY ADENOMA;  Surgeon: Llewellyn Gerard LABOR, DO;  Location: [facility] OR;  Service: ENT;  Laterality: N/A;   Patient Active Problem List   Diagnosis Date Noted   H/O partial hypophysectomy (HCC) 06/21/2022   Pituitary mass (HCC) 03/12/2022   Pituitary tumor  03/12/2022   DOE (dyspnea on exertion) 08/27/2021   Pneumothorax, traumatic 04/14/2016   H. pylori infection 08/22/2014   Reflux esophagitis    Dysphagia, pharyngoesophageal phase    Mucosal abnormality of stomach    History of colonic polyps    Chronic hepatitis C (HCC) 07/09/2014   Abnormal weight loss 07/09/2014   History of ETOH abuse 07/09/2014   Esophageal dysphagia 07/09/2014   Odynophagia 07/09/2014   Abnormal ECG 05/28/2014   PCP: Dr. Norleen General  REFERRING PROVIDER: Dr. Pastor Falling  ONSET DATE: chronic  REFERRING DIAG: G20.B1 (ICD-10-CM) - Parkinson's disease with dyskinesia, unspecified whether manifestations fluctuate (HCC)   THERAPY DIAG:  No diagnosis found.  Rationale for Evaluation and Treatment: Rehabilitation  SUBJECTIVE:   SUBJECTIVE STATEMENT: S: I can't do anything with my hand.  Pt accompanied by: self and significant other  PERTINENT HISTORY: Pt is a 68 y/o male presenting with Parkinson's disease, has resting tremors and decreased grip and pinch strength impacting use of dominant RUE during ADLs.   PRECAUTIONS: Other: BP issues  WEIGHT BEARING RESTRICTIONS: No  PAIN:  Are you having pain? Yes: NPRS scale: 9/10 Pain location: left ulnar side of arm Pain description: aching Aggravating factors: use, sleeping with elbow bent Relieving factors: nothing  FALLS: Has patient fallen in last 6 months? No  PLOF: Independent  PATIENT GOALS: To have more use of the RUE  OBJECTIVE:  Note: Objective measures were completed at Evaluation unless  otherwise noted.  HAND DOMINANCE: Right  ADLs: Overall ADLs: Pt is having difficulty with operating buttons, tying shoes, writing, eating. Pt is having difficulty putting socks. Pt reports weakness in both hands.   FUNCTIONAL OUTCOME MEASURES: Quick Dash: 50 09/08/23: 56.82  UPPER EXTREMITY ROM:    A/ROM of RUE is WNL  UPPER EXTREMITY MMT:     MMT Right eval Right 09/08/23  Elbow flexion 5/5  5/5  Elbow extension 5/5 5/5  Wrist flexion 4-/5 5/5  Wrist extension 4/5 4/5  Wrist ulnar deviation 3+/5 4/5  Wrist radial deviation 3+/5 4/5  Wrist pronation 4/5 5/5  Wrist supination 4/5 5/5  (Blank rows = not tested)  HAND FUNCTION: Grip strength: Right: 10 lbs; Left: 35 lbs, Lateral pinch: Right: 3 lbs, Left: 3 lbs, and 3 point pinch: Right: 3 lbs, Left: 3 lbs 09/08/23: Grip strength: Right: 20 lbs; Lateral pinch: Right: 3 lbs, and 3 point pinch: Right: 3 lbs  COORDINATION: 9 Hole Peg test: Right: 38.37 sec; Left: 36.41 sec 09/08/23: 9 Hole Peg test: Right: 1 min 31 sec  SENSATION: WFL Subjective moderate to severe tingling/numbness in bilateral hands left>right  EDEMA: none  COGNITION: Overall cognitive status: Within functional limits for tasks assessed  OBSERVATIONS: Pt arrived with brace for lateral epicondylitis on proximal forearm of LUE.                                                                                                                              TREATMENT DATE:   09/16/23 -Digit ROM: composite flexion, opposition, finger taps, x10,  unable to abduct -Grooved Peg board -Thera-bar: yellow bar, flexion/extension twists, supination bends, pronation bends, radial bends, ulnar bends, x10 -Pinch tree: yellow, red, green resistance clips, tripod up, lateral down -Gripper: 20# 8 medium beads, 22# 10 squeezes (unable to fully close) -nuts and bolts: unscrewing and screwing nuts back on to the bolts x6 - starting from smaller to larger  09/08/23 -Reassessment for Recertification -Digit ROM: composite flexion, opposition, finger taps, x10,  unable to abduct -Gripper: 15# 8 medium beads, 18# 10 squeezes (unable to fully close) -Sponges: 23, 23 -Theraputty: red putty, roll into ball, flatten into pancake, roll into log, tripod pinch x10, lateral pinch x10, roll into ball, squeeze x10  08/31/23 -Wrist ROM: 1#, on half dowel, flexion, extension, ulnar/radial  deviation, supination/pronation, x10 -green weighted ball - ABC in the air - dropped ball at H, S -Pinch strengthening - pick up and stack 6 cubes - yellow resistance clip, attempted red, unable to pinch open enough -Yellow theraputty: roll into a ball, flatten into pancake, roll into log, tripod pinch x10, lateral pinch x10, roll into a ball, squeeze x10 -Coins: 10 pennies, 3 nickels, 2 dimes, holding all in hand and placing them 1 at a time in piggy bank.  -Digit ROM: composite flexion, opposition, finger taps, x10,  unable to abduct -Gripper: 11# 6 medium beads    PATIENT EDUCATION: Education  details: Continue HEP Person educated: Patient and Spouse Education method: Explanation and Handouts Education comprehension: verbalized understanding  HOME EXERCISE PROGRAM: Eval: yellow theraputty for grip/pinch 7/3: Wrist and Digit ROM    GOALS: Goals reviewed with patient? Yes  SHORT TERM GOALS: Target date: 09/17/23  Pt will be provided with and educated on HEP to improve functional use of RUE during ADLs.   Goal status: IN PROGRESS  2.  Pt will be educated on AE available to improve independence and success with tedious ADLs.   Goal status: IN PROGRESS  3.  Pt will increase right grip strength by 15# and pinch strength by 4# to improve ability to grasp and hold items when carrying or moving room to room during ADLs or housework tasks.  Goal status: IN PROGRESS  4.  Pt will increase right hand coordination required for tedious manipulation tasks by completing 9 hole peg test in under 30   Goal status: IN PROGRESS    ASSESSMENT:  CLINICAL IMPRESSION: This session pt continues to work on his overall strength and mobility. He started using the therabar this session for wrist and grip strengthening. He also demonstrated improving coordination, where he utilized nuts and bolts. OT providing verbal and tactile cuing intermittently throughout session for positioning and  technique.   PERFORMANCE DEFICITS: in functional skills including ADLs, IADLs, coordination, dexterity, sensation, strength, pain, and UE functional use    PLAN:  OT FREQUENCY: 1x/week  OT DURATION: 4 weeks  PLANNED INTERVENTIONS: 97168 OT Re-evaluation, 97535 self care/ADL training, 02889 therapeutic exercise, 97530 therapeutic activity, 97112 neuromuscular re-education, patient/family education, and DME and/or AE instructions  RECOMMENDED OTHER SERVICES: Orthopedic referral to assess LUE  CONSULTED AND AGREED WITH PLAN OF CARE: Patient and family member/caregiver  PLAN FOR NEXT SESSION: Follow up on HEP, begin grip/pinch strengthening and coordination work   Valentin Nightingale, OTR/L [phone] 09/16/2023, 11:26 AM

## 2023-09-20 DIAGNOSIS — M5412 Radiculopathy, cervical region: Secondary | ICD-10-CM | POA: Diagnosis not present

## 2023-09-20 DIAGNOSIS — M542 Cervicalgia: Secondary | ICD-10-CM | POA: Diagnosis not present

## 2023-09-22 ENCOUNTER — Other Ambulatory Visit: Payer: Self-pay | Admitting: Neurology

## 2023-09-23 ENCOUNTER — Encounter (HOSPITAL_COMMUNITY): Payer: Self-pay | Admitting: Occupational Therapy

## 2023-09-23 ENCOUNTER — Ambulatory Visit (HOSPITAL_COMMUNITY): Attending: Neurology | Admitting: Occupational Therapy

## 2023-09-23 DIAGNOSIS — R29818 Other symptoms and signs involving the nervous system: Secondary | ICD-10-CM | POA: Insufficient documentation

## 2023-09-23 DIAGNOSIS — R278 Other lack of coordination: Secondary | ICD-10-CM | POA: Diagnosis not present

## 2023-09-23 NOTE — Telephone Encounter (Signed)
 Patient req Rx refill. Message from call service stated: carvedilol 25/100 mg. There is no such medication. I reviewed the chart and he has a pending order for C/L 25/100 mg 1 pill qid. I signed the Rx. Patient has an upcoming appt with Dr. Gregg.

## 2023-09-23 NOTE — Therapy (Signed)
 OUTPATIENT OCCUPATIONAL THERAPY NEURO TREATMENT NOTE    Patient Name: Bobby York MRN: [id number] DOB:1955/09/20, 68 y.o., male Today's Date: 09/23/2023   END OF SESSION:  OT End of Session - 09/23/23 1201     Visit Number 6    Number of Visits 8    Date for OT Re-Evaluation 10/07/23    Authorization Type 1) [employer], copay $45 2) [facility]    Authorization Time Period no auth    Progress Note Due on Visit 10    OT Start Time 1032    OT Stop Time 1112    OT Time Calculation (min) 40 min    Activity Tolerance Patient tolerated treatment well    Behavior During Therapy WFL for tasks assessed/performed          Past Medical History:  Diagnosis Date   Bipolar 1 disorder (HCC)    diagnosed 2004   Dyspnea    Hepatitis    Hep C   Substance abuse (HCC) 2021   Past Surgical History:  Procedure Laterality Date   BACK SURGERY     two-lumbar disc x2   CLAVICLE SURGERY     COLONOSCOPY WITH PROPOFOL  N/A 07/25/2014   MFM:dpwhoz rectal polyp otherwise normal . hyperplastic polyp. TCS in 07/2024   CRANIOTOMY N/A 03/12/2022   Procedure: ENDOSCOPIC ENDONASAL TRANSPHENOIDAL RESECTION OF PITUITARY MASS;  Surgeon: Debby Dorn MATSU, MD;  Location: [facility] OR;  Service: Neurosurgery;  Laterality: N/A;   ESOPHAGEAL DILATION N/A 07/25/2014   Procedure: ESOPHAGEAL DILATION;  Surgeon: Lamar CHRISTELLA Hollingshead, MD;  Location: [facility] ORS;  Service: Endoscopy;  Laterality: N/A;  Malony dilators # 56,   ESOPHAGOGASTRODUODENOSCOPY (EGD) WITH PROPOFOL  N/A 07/25/2014   MFM:fpoi erosive reflux/s/p dilator/HH. +hpylor    NECK SURGERY     TRANSPHENOIDAL APPROACH EXPOSURE N/A 03/12/2022   Procedure: TRANSPHENOIDAL ENDOSCOPIC RESCTION OF PITUITARY ADENOMA;  Surgeon: Llewellyn Gerard LABOR, DO;  Location: [facility] OR;  Service: ENT;  Laterality: N/A;   Patient Active Problem List   Diagnosis Date Noted   H/O partial hypophysectomy (HCC) 06/21/2022   Pituitary mass (HCC) 03/12/2022   Pituitary tumor 03/12/2022   DOE (dyspnea on  exertion) 08/27/2021   Pneumothorax, traumatic 04/14/2016   H. pylori infection 08/22/2014   Reflux esophagitis    Dysphagia, pharyngoesophageal phase    Mucosal abnormality of stomach    History of colonic polyps    Chronic hepatitis C (HCC) 07/09/2014   Abnormal weight loss 07/09/2014   History of ETOH abuse 07/09/2014   Esophageal dysphagia 07/09/2014   Odynophagia 07/09/2014   Abnormal ECG 05/28/2014   PCP: Dr. Norleen General  REFERRING PROVIDER: Dr. Pastor Falling  ONSET DATE: chronic  REFERRING DIAG: G20.B1 (ICD-10-CM) - Parkinson's disease with dyskinesia, unspecified whether manifestations fluctuate (HCC)   THERAPY DIAG:  Other lack of coordination  Other symptoms and signs involving the nervous system  Rationale for Evaluation and Treatment: Rehabilitation  SUBJECTIVE:   SUBJECTIVE STATEMENT: S: I am really hurting today Pt accompanied by: self and significant other  PERTINENT HISTORY: Pt is a 68 y/o male presenting with Parkinson's disease, has resting tremors and decreased grip and pinch strength impacting use of dominant RUE during ADLs.   PRECAUTIONS: Other: BP issues  WEIGHT BEARING RESTRICTIONS: No  PAIN:  Are you having pain? Yes: NPRS scale: 8/10 Pain location: left ulnar side of arm Pain description: aching Aggravating factors: use, sleeping with elbow bent Relieving factors: nothing  FALLS: Has patient fallen in last 6 months? No  PLOF: Independent  PATIENT GOALS: To have more use of the RUE  OBJECTIVE:  Note: Objective measures were completed at Evaluation unless otherwise noted.  HAND DOMINANCE: Right  ADLs: Overall ADLs: Pt is having difficulty with operating buttons, tying shoes, writing, eating. Pt is having difficulty putting socks. Pt reports weakness in both hands.   FUNCTIONAL OUTCOME MEASURES: Quick Dash: 50 09/08/23: 56.82  UPPER EXTREMITY ROM:    A/ROM of RUE is WNL  UPPER EXTREMITY MMT:     MMT Right eval  Right 09/08/23  Elbow flexion 5/5 5/5  Elbow extension 5/5 5/5  Wrist flexion 4-/5 5/5  Wrist extension 4/5 4/5  Wrist ulnar deviation 3+/5 4/5  Wrist radial deviation 3+/5 4/5  Wrist pronation 4/5 5/5  Wrist supination 4/5 5/5  (Blank rows = not tested)  HAND FUNCTION: Grip strength: Right: 10 lbs; Left: 35 lbs, Lateral pinch: Right: 3 lbs, Left: 3 lbs, and 3 point pinch: Right: 3 lbs, Left: 3 lbs 09/08/23: Grip strength: Right: 20 lbs; Lateral pinch: Right: 3 lbs, and 3 point pinch: Right: 3 lbs  COORDINATION: 9 Hole Peg test: Right: 38.37 sec; Left: 36.41 sec 09/08/23: 9 Hole Peg test: Right: 1 min 31 sec  SENSATION: WFL Subjective moderate to severe tingling/numbness in bilateral hands left>right  EDEMA: none  COGNITION: Overall cognitive status: Within functional limits for tasks assessed  OBSERVATIONS: Pt arrived with brace for lateral epicondylitis on proximal forearm of LUE.                                                                                                                              TREATMENT DATE:   09/23/23 -Wrist ROM: 2#, flexion, extension, ulnar/radial deviation, supination/pronation, x12 -Thera-bar: yellow bar, flexion/extension twists, supination bends, pronation bends, radial bends, ulnar bends, x10 -Tiny Peg board -Gripper: 22# 12 medium beads, 23# 10 squeezes (unable to fully close) -Perfection game  09/16/23 -Digit ROM: composite flexion, opposition, finger taps, x10,  unable to abduct -Grooved Peg board -Thera-bar: yellow bar, flexion/extension twists, supination bends, pronation bends, radial bends, ulnar bends, x10 -Pinch tree: yellow, red, green resistance clips, tripod up, lateral down -Gripper: 20# 8 medium beads, 22# 10 squeezes (unable to fully close) -nuts and bolts: unscrewing and screwing nuts back on to the bolts x6 - starting from smaller to larger  09/08/23 -Reassessment for Recertification -Digit ROM: composite flexion,  opposition, finger taps, x10,  unable to abduct -Gripper: 15# 8 medium beads, 18# 10 squeezes (unable to fully close) -Sponges: 23, 23 -Theraputty: red putty, roll into ball, flatten into pancake, roll into log, tripod pinch x10, lateral pinch x10, roll into ball, squeeze x10  08/31/23 -Wrist ROM: 1#, on half dowel, flexion, extension, ulnar/radial deviation, supination/pronation, x10 -green weighted ball - ABC in the air - dropped ball at H, S -Pinch strengthening - pick up and stack 6 cubes - yellow resistance clip, attempted red, unable to pinch open enough -Yellow theraputty: roll into  a ball, flatten into pancake, roll into log, tripod pinch x10, lateral pinch x10, roll into a ball, squeeze x10 -Coins: 10 pennies, 3 nickels, 2 dimes, holding all in hand and placing them 1 at a time in piggy bank.  -Digit ROM: composite flexion, opposition, finger taps, x10,  unable to abduct -Gripper: 11# 6 medium beads    PATIENT EDUCATION: Education details: Continue HEP Person educated: Patient and Spouse Education method: [employer] Technology [employer] Education comprehension: verbalized understanding  HOME EXERCISE PROGRAM: Eval: yellow theraputty for grip/pinch 7/3: Wrist and Digit ROM    GOALS: Goals reviewed with patient? Yes  SHORT TERM GOALS: Target date: 09/17/23  Pt will be provided with and educated on HEP to improve functional use of RUE during ADLs.   Goal status: IN PROGRESS  2.  Pt will be educated on AE available to improve independence and success with tedious ADLs.   Goal status: IN PROGRESS  3.  Pt will increase right grip strength by 15# and pinch strength by 4# to improve ability to grasp and hold items when carrying or moving room to room during ADLs or housework tasks.  Goal status: IN PROGRESS  4.  Pt will increase right hand coordination required for tedious manipulation tasks by completing 9 hole peg test in under 30   Goal status: IN  PROGRESS    ASSESSMENT:  CLINICAL IMPRESSION: Pt having severe pain in his L arm this session, limiting his concentration/attention to tasks due to difficulty finding comfortable positions. He worked on fine motor tasks, as well as strength based tasks this session to improve his ability to complete IADL's and small object manipulation at home. Pt is able to tolerate increased resistance with the hand gripper, as well as increased weight for wrist ROM. OT providing verbal and tactile cuing provided for positioning and technique.   PERFORMANCE DEFICITS: in functional skills including ADLs, IADLs, coordination, dexterity, sensation, strength, pain, and UE functional use    PLAN:  OT FREQUENCY: 1x/week  OT DURATION: 4 weeks  PLANNED INTERVENTIONS: 97168 OT Re-evaluation, 97535 self care/ADL training, 02889 therapeutic exercise, 97530 therapeutic activity, 97112 neuromuscular re-education, patient/family education, and DME and/or AE instructions  RECOMMENDED OTHER SERVICES: Orthopedic referral to assess LUE  CONSULTED AND AGREED WITH PLAN OF CARE: Patient and family member/caregiver  PLAN FOR NEXT SESSION: Follow up on HEP, begin grip/pinch strengthening and coordination work   Valentin Nightingale, OTR/L [phone] 09/23/2023, 12:02 PM

## 2023-09-30 ENCOUNTER — Encounter (HOSPITAL_COMMUNITY): Admitting: Occupational Therapy

## 2023-09-30 DIAGNOSIS — M4803 Spinal stenosis, cervicothoracic region: Secondary | ICD-10-CM | POA: Diagnosis not present

## 2023-09-30 DIAGNOSIS — M4804 Spinal stenosis, thoracic region: Secondary | ICD-10-CM | POA: Diagnosis not present

## 2023-09-30 DIAGNOSIS — M47812 Spondylosis without myelopathy or radiculopathy, cervical region: Secondary | ICD-10-CM | POA: Diagnosis not present

## 2023-09-30 DIAGNOSIS — Z981 Arthrodesis status: Secondary | ICD-10-CM | POA: Diagnosis not present

## 2023-09-30 DIAGNOSIS — M4802 Spinal stenosis, cervical region: Secondary | ICD-10-CM | POA: Diagnosis not present

## 2023-10-05 DIAGNOSIS — M5412 Radiculopathy, cervical region: Secondary | ICD-10-CM | POA: Diagnosis not present

## 2023-10-05 DIAGNOSIS — M542 Cervicalgia: Secondary | ICD-10-CM | POA: Diagnosis not present

## 2023-10-07 ENCOUNTER — Ambulatory Visit (HOSPITAL_COMMUNITY): Admitting: Occupational Therapy

## 2023-10-07 ENCOUNTER — Encounter (HOSPITAL_COMMUNITY): Payer: Self-pay | Admitting: Occupational Therapy

## 2023-10-07 DIAGNOSIS — R278 Other lack of coordination: Secondary | ICD-10-CM

## 2023-10-07 DIAGNOSIS — R29818 Other symptoms and signs involving the nervous system: Secondary | ICD-10-CM | POA: Diagnosis not present

## 2023-10-07 NOTE — Therapy (Unsigned)
 OUTPATIENT OCCUPATIONAL THERAPY NEURO TREATMENT NOTE    Patient Name: Bobby York MRN: [id number] DOB:May 10, 1955, 68 y.o., male Today's Date: 10/07/2023   END OF SESSION:    Past Medical History:  Diagnosis Date   Bipolar 1 disorder (HCC)    diagnosed 2004   Dyspnea    Hepatitis    Hep C   Substance abuse (HCC) 2021   Past Surgical History:  Procedure Laterality Date   BACK SURGERY     two-lumbar disc x2   CLAVICLE SURGERY     COLONOSCOPY WITH PROPOFOL  N/A 07/25/2014   MFM:dpwhoz rectal polyp otherwise normal . hyperplastic polyp. TCS in 07/2024   CRANIOTOMY N/A 03/12/2022   Procedure: ENDOSCOPIC ENDONASAL TRANSPHENOIDAL RESECTION OF PITUITARY MASS;  Surgeon: Debby Dorn MATSU, MD;  Location: [facility] OR;  Service: Neurosurgery;  Laterality: N/A;   ESOPHAGEAL DILATION N/A 07/25/2014   Procedure: ESOPHAGEAL DILATION;  Surgeon: Lamar CHRISTELLA Hollingshead, MD;  Location: [facility];  Service: Endoscopy;  Laterality: N/A;  Malony dilators # 56,   ESOPHAGOGASTRODUODENOSCOPY (EGD) WITH PROPOFOL  N/A 07/25/2014   MFM:fpoi erosive reflux/s/p dilator/HH. +hpylor    NECK SURGERY     TRANSPHENOIDAL APPROACH EXPOSURE N/A 03/12/2022   Procedure: TRANSPHENOIDAL ENDOSCOPIC RESCTION OF PITUITARY ADENOMA;  Surgeon: Llewellyn Gerard LABOR, DO;  Location: [facility] OR;  Service: ENT;  Laterality: N/A;   Patient Active Problem List   Diagnosis Date Noted   H/O partial hypophysectomy (HCC) 06/21/2022   Pituitary mass (HCC) 03/12/2022   Pituitary tumor 03/12/2022   DOE (dyspnea on exertion) 08/27/2021   Pneumothorax, traumatic 04/14/2016   H. pylori infection 08/22/2014   Reflux esophagitis    Dysphagia, pharyngoesophageal phase    Mucosal abnormality of stomach    History of colonic polyps    Chronic hepatitis C (HCC) 07/09/2014   Abnormal weight loss 07/09/2014   History of ETOH abuse 07/09/2014   Esophageal dysphagia 07/09/2014   Odynophagia 07/09/2014   Abnormal ECG 05/28/2014   PCP: Dr. Norleen General  REFERRING  PROVIDER: Dr. Pastor Falling  ONSET DATE: chronic  REFERRING DIAG: G20.B1 (ICD-10-CM) - Parkinson's disease with dyskinesia, unspecified whether manifestations fluctuate (HCC)   THERAPY DIAG:  No diagnosis found.  Rationale for Evaluation and Treatment: Rehabilitation  SUBJECTIVE:   SUBJECTIVE STATEMENT: S: I am really hurting today Pt accompanied by: self and significant other  PERTINENT HISTORY: Pt is a 68 y/o male presenting with Parkinson's disease, has resting tremors and decreased grip and pinch strength impacting use of dominant RUE during ADLs.   PRECAUTIONS: Other: BP issues  WEIGHT BEARING RESTRICTIONS: No  PAIN:  Are you having pain? Yes: NPRS scale: 8/10 Pain location: left ulnar side of arm Pain description: aching Aggravating factors: use, sleeping with elbow bent Relieving factors: nothing  FALLS: Has patient fallen in last 6 months? No  PLOF: Independent  PATIENT GOALS: To have more use of the RUE  OBJECTIVE:  Note: Objective measures were completed at Evaluation unless otherwise noted.  HAND DOMINANCE: Right  ADLs: Overall ADLs: Pt is having difficulty with operating buttons, tying shoes, writing, eating. Pt is having difficulty putting socks. Pt reports weakness in both hands.   FUNCTIONAL OUTCOME MEASURES: Quick Dash: 50 09/08/23: 56.82 10/07/23: 54.55  UPPER EXTREMITY ROM:    A/ROM of RUE is WNL  UPPER EXTREMITY MMT:     MMT Right eval Right 09/08/23 Right 10/07/23  Elbow flexion 5/5 5/5 5/5  Elbow extension 5/5 5/5 5/5  Wrist flexion 4-/5 5/5 5/5  Wrist extension 4/5  4/5 5/5  Wrist ulnar deviation 3+/5 4/5 5/5  Wrist radial deviation 3+/5 4/5 4+/5  Wrist pronation 4/5 5/5 5/5  Wrist supination 4/5 5/5 4/5  (Blank rows = not tested)  HAND FUNCTION: Grip strength: Right: 10 lbs; Left: 35 lbs, Lateral pinch: Right: 3 lbs, Left: 3 lbs, and 3 point pinch: Right: 3 lbs, Left: 3 lbs 09/08/23: Grip strength: Right: 20 lbs; Lateral pinch:  Right: 3 lbs, and 3 point pinch: Right: 3 lbs 10/07/23: Grip strength: Right: 20 lbs; Lateral pinch: Right: 3 lbs, and 3 point pinch: Right: 3 lbs  COORDINATION: 9 Hole Peg test: Right: 38.37 sec; Left: 36.41 sec 09/08/23: 9 Hole Peg test: Right: 1 min 31 sec 10/07/23: 9 Hole Peg test: Right: 51.30 sec  SENSATION: WFL Subjective moderate to severe tingling/numbness in bilateral hands left>right  EDEMA: none  COGNITION: Overall cognitive status: Within functional limits for tasks assessed  OBSERVATIONS: Pt arrived with brace for lateral epicondylitis on proximal forearm of LUE.                                                                                                                              TREATMENT DATE:   10/07/23 -BP 94/73, HR 95, O2 97% -Thera-bar: yellow bar, flexion/extension twists, supination bends, pronation bends, radial bends, ulnar bends, x15 -Pinch tree: red, green resistance clips, tripod up, lateral down -Reassessment for recertification  09/23/23 -Wrist ROM: 2#, flexion, extension, ulnar/radial deviation, supination/pronation, x12 -Thera-bar: yellow bar, flexion/extension twists, supination bends, pronation bends, radial bends, ulnar bends, x10 -Tiny Peg board -Gripper: 22# 12 medium beads, 23# 10 squeezes (unable to fully close) -Perfection game  09/16/23 -Digit ROM: composite flexion, opposition, finger taps, x10,  unable to abduct -Grooved Peg board -Thera-bar: yellow bar, flexion/extension twists, supination bends, pronation bends, radial bends, ulnar bends, x10 -Pinch tree: yellow, red, green resistance clips, tripod up, lateral down -Gripper: 20# 8 medium beads, 22# 10 squeezes (unable to fully close) -nuts and bolts: unscrewing and screwing nuts back on to the bolts x6 - starting from smaller to larger  09/08/23 -Reassessment for Recertification -Digit ROM: composite flexion, opposition, finger taps, x10,  unable to abduct -Gripper: 15# 8 medium  beads, 18# 10 squeezes (unable to fully close) -Sponges: 23, 23 -Theraputty: red putty, roll into ball, flatten into pancake, roll into log, tripod pinch x10, lateral pinch x10, roll into ball, squeeze x10  08/31/23 -Wrist ROM: 1#, on half dowel, flexion, extension, ulnar/radial deviation, supination/pronation, x10 -green weighted ball - ABC in the air - dropped ball at H, S -Pinch strengthening - pick up and stack 6 cubes - yellow resistance clip, attempted red, unable to pinch open enough -Yellow theraputty: roll into a ball, flatten into pancake, roll into log, tripod pinch x10, lateral pinch x10, roll into a ball, squeeze x10 -Coins: 10 pennies, 3 nickels, 2 dimes, holding all in hand and placing them 1 at a time in piggy bank.  -Digit ROM:  composite flexion, opposition, finger taps, x10,  unable to abduct -Gripper: 11# 6 medium beads    PATIENT EDUCATION: Education details: Continue HEP Person educated: Patient and Spouse Education method: Explanation and Handouts Education comprehension: verbalized understanding  HOME EXERCISE PROGRAM: Eval: yellow theraputty for grip/pinch 7/3: Wrist and Digit ROM    GOALS: Goals reviewed with patient? Yes  SHORT TERM GOALS: Target date: 09/17/23  Pt will be provided with and educated on HEP to improve functional use of RUE during ADLs.   Goal status: IN PROGRESS  2.  Pt will be educated on AE available to improve independence and success with tedious ADLs.   Goal status: IN PROGRESS  3.  Pt will increase right grip strength by 15# and pinch strength by 4# to improve ability to grasp and hold items when carrying or moving room to room during ADLs or housework tasks.  Goal status: IN PROGRESS  4.  Pt will increase right hand coordination required for tedious manipulation tasks by completing 9 hole peg test in under 30   Goal status: IN PROGRESS    ASSESSMENT:  CLINICAL IMPRESSION: Pt having severe pain in his L arm this  session, limiting his concentration/attention to tasks due to difficulty finding comfortable positions. He worked on fine motor tasks, as well as strength based tasks this session to improve his ability to complete IADL's and small object manipulation at home. Pt is able to tolerate increased resistance with the hand gripper, as well as increased weight for wrist ROM. OT providing verbal and tactile cuing provided for positioning and technique.   PERFORMANCE DEFICITS: in functional skills including ADLs, IADLs, coordination, dexterity, sensation, strength, pain, and UE functional use    PLAN:  OT FREQUENCY: 1x/week  OT DURATION: 4 weeks  PLANNED INTERVENTIONS: 97168 OT Re-evaluation, 97535 self care/ADL training, 02889 therapeutic exercise, 97530 therapeutic activity, 97112 neuromuscular re-education, patient/family education, and DME and/or AE instructions  RECOMMENDED OTHER SERVICES: Orthopedic referral to assess LUE  CONSULTED AND AGREED WITH PLAN OF CARE: Patient and family member/caregiver  PLAN FOR NEXT SESSION: Follow up on HEP, begin grip/pinch strengthening and coordination work   Valentin Nightingale, OTR/L [phone] 10/07/2023, 11:26 AM

## 2023-10-14 ENCOUNTER — Encounter (HOSPITAL_COMMUNITY): Admitting: Occupational Therapy

## 2023-10-14 ENCOUNTER — Ambulatory Visit (HOSPITAL_COMMUNITY): Admitting: Occupational Therapy

## 2023-10-14 ENCOUNTER — Encounter (HOSPITAL_COMMUNITY): Payer: Self-pay | Admitting: Occupational Therapy

## 2023-10-14 DIAGNOSIS — R278 Other lack of coordination: Secondary | ICD-10-CM | POA: Diagnosis not present

## 2023-10-14 DIAGNOSIS — R29818 Other symptoms and signs involving the nervous system: Secondary | ICD-10-CM | POA: Diagnosis not present

## 2023-10-14 NOTE — Therapy (Signed)
 OUTPATIENT OCCUPATIONAL THERAPY NEURO TREATMENT NOTE    Patient Name: Bobby York MRN: [id number] DOB:08/11/1955, 68 y.o., male Today's Date: 10/14/2023   END OF SESSION:  OT End of Session - 10/14/23 1053     Visit Number 8    Number of Visits 13    Date for OT Re-Evaluation 11/18/23    Authorization Type 1) [employer], copay [place]45 2) [facility]    Authorization Time Period no auth    Progress Note Due on Visit 10    OT Start Time 1018    OT Stop Time 1057    OT Time Calculation (min) 39 min    Activity Tolerance Patient tolerated treatment well    Behavior During Therapy WFL for tasks assessed/performed           Past Medical History:  Diagnosis Date   Bipolar 1 disorder (HCC)    diagnosed 2004   Dyspnea    Hepatitis    Hep C   Substance abuse (HCC) 2021   Past Surgical History:  Procedure Laterality Date   BACK SURGERY     two-lumbar disc x2   CLAVICLE SURGERY     COLONOSCOPY WITH PROPOFOL  N/A 07/25/2014   MFM:dpwhoz rectal polyp otherwise normal . hyperplastic polyp. TCS in 07/2024   CRANIOTOMY N/A 03/12/2022   Procedure: ENDOSCOPIC ENDONASAL TRANSPHENOIDAL RESECTION OF PITUITARY MASS;  Surgeon: Debby Dorn MATSU, MD;  Location: [facility] OR;  Service: Neurosurgery;  Laterality: N/A;   ESOPHAGEAL DILATION N/A 07/25/2014   Procedure: ESOPHAGEAL DILATION;  Surgeon: Lamar CHRISTELLA Hollingshead, MD;  Location: [facility] ORS;  Service: Endoscopy;  Laterality: N/A;  Malony dilators # 56,   ESOPHAGOGASTRODUODENOSCOPY (EGD) WITH PROPOFOL  N/A 07/25/2014   MFM:fpoi erosive reflux/s/p dilator/HH. +hpylor    NECK SURGERY     TRANSPHENOIDAL APPROACH EXPOSURE N/A 03/12/2022   Procedure: TRANSPHENOIDAL ENDOSCOPIC RESCTION OF PITUITARY ADENOMA;  Surgeon: Llewellyn Gerard LABOR, DO;  Location: [facility] OR;  Service: ENT;  Laterality: N/A;   Patient Active Problem List   Diagnosis Date Noted   H/O partial hypophysectomy (HCC) 06/21/2022   Pituitary mass (HCC) 03/12/2022   Pituitary tumor 03/12/2022   DOE (dyspnea on  exertion) 08/27/2021   Pneumothorax, traumatic 04/14/2016   H. pylori infection 08/22/2014   Reflux esophagitis    Dysphagia, pharyngoesophageal phase    Mucosal abnormality of stomach    History of colonic polyps    Chronic hepatitis C (HCC) 07/09/2014   Abnormal weight loss 07/09/2014   History of ETOH abuse 07/09/2014   Esophageal dysphagia 07/09/2014   Odynophagia 07/09/2014   Abnormal ECG 05/28/2014   PCP: Dr. Norleen General  REFERRING PROVIDER: Dr. Pastor Falling  ONSET DATE: chronic  REFERRING DIAG: G20.B1 (ICD-10-CM) - Parkinson's disease with dyskinesia, unspecified whether manifestations fluctuate (HCC)   THERAPY DIAG:  Other lack of coordination  Other symptoms and signs involving the nervous system  Rationale for Evaluation and Treatment: Rehabilitation  SUBJECTIVE:   SUBJECTIVE STATEMENT: S: I'm dizzy as a June bug on a string. Pt accompanied by: self and significant other  PERTINENT HISTORY: Pt is a 68 y/o male presenting with Parkinson's disease, has resting tremors and decreased grip and pinch strength impacting use of dominant RUE during ADLs.   PRECAUTIONS: Other: BP issues  WEIGHT BEARING RESTRICTIONS: No  PAIN:  Are you having pain? Yes: NPRS scale: 8/10 Pain location: left elbow Pain description: aching Aggravating factors: use, sleeping with elbow bent Relieving factors: nothing  FALLS: Has patient fallen in last 6 months?  No  PLOF: Independent  PATIENT GOALS: To have more use of the RUE  OBJECTIVE:  Note: Objective measures were completed at Evaluation unless otherwise noted.  HAND DOMINANCE: Right  ADLs: Overall ADLs: Pt is having difficulty with operating buttons, tying shoes, writing, eating. Pt is having difficulty putting socks. Pt reports weakness in both hands.   FUNCTIONAL OUTCOME MEASURES: Quick Dash: 50 09/08/23: 56.82 10/07/23: 54.55  UPPER EXTREMITY ROM:    A/ROM of RUE is WNL  UPPER EXTREMITY MMT:     MMT  Right eval Right 09/08/23 Right 10/07/23  Elbow flexion 5/5 5/5 5/5  Elbow extension 5/5 5/5 5/5  Wrist flexion 4-/5 5/5 5/5  Wrist extension 4/5 4/5 5/5  Wrist ulnar deviation 3+/5 4/5 5/5  Wrist radial deviation 3+/5 4/5 4+/5  Wrist pronation 4/5 5/5 5/5  Wrist supination 4/5 5/5 4/5  (Blank rows = not tested)  HAND FUNCTION: Grip strength: Right: 10 lbs; Left: 35 lbs, Lateral pinch: Right: 3 lbs, Left: 3 lbs, and 3 point pinch: Right: 3 lbs, Left: 3 lbs 09/08/23: Grip strength: Right: 20 lbs; Lateral pinch: Right: 3 lbs, and 3 point pinch: Right: 3 lbs 10/07/23: Grip strength: Right: 20 lbs; Lateral pinch: Right: 3 lbs, and 3 point pinch: Right: 3 lbs  COORDINATION: 9 Hole Peg test: Right: 38.37 sec; Left: 36.41 sec 09/08/23: 9 Hole Peg test: Right: 1 min 31 sec 10/07/23: 9 Hole Peg test: Right: 51.30 sec  SENSATION: WFL Subjective moderate to severe tingling/numbness in bilateral hands left>right  EDEMA: none  COGNITION: Overall cognitive status: Within functional limits for tasks assessed  OBSERVATIONS: Pt arrived with brace for lateral epicondylitis on proximal forearm of LUE.                                                                                                                              TREATMENT DATE:  10/14/23 -Strengthening: forearm supination, radial deviation-red theraband and 2# weight, 10 reps each -Hand gripper: large and medium beads with gripper vertical at 22# -Pinch task: pt using yellow clothespin and 3 point pinch to stack 3 towers of 5 sponges; alternating yellow and red clothespins and lateral pinch to replace sponges back into bucket.  -Grooved pegboard: pt holding 4 pegs in palm and working on translating to fingertips to place into board. Max difficulty with translating due to digit stiffness, mostly shaking to achieve tip pinch. Increased time for completion.   10/07/23 -BP 94/73, HR 95, O2 97% -Wrist ROM: 2#, flexion, extension,  ulnar/radial deviation, supination/pronation, x12 -Digit ROM: composite flexion, opposition, finger taps, x10,  unable to abduct -Thera-bar: yellow bar, flexion/extension twists, supination bends, pronation bends, radial bends, ulnar bends, x15 -Pinch tree: red, green resistance clips, tripod up, lateral down -Reassessment for recertification  09/23/23 -Wrist ROM: 2#, flexion, extension, ulnar/radial deviation, supination/pronation, x12 -Thera-bar: yellow bar, flexion/extension twists, supination bends, pronation bends, radial bends, ulnar bends, x10 -Tiny Peg board -Gripper: 22# 12 medium beads,  23# 10 squeezes (unable to fully close) Museum/gallery exhibitions officer game    PATIENT EDUCATION: Education details: Continue HEP Person educated: Patient and Spouse Education method: Chief Technology Officer Education comprehension: verbalized understanding  HOME EXERCISE PROGRAM: Eval: yellow theraputty for grip/pinch 7/3: Wrist and Digit ROM    GOALS: Goals reviewed with patient? Yes  SHORT TERM GOALS: Target date: 09/17/23  Pt will be provided with and educated on HEP to improve functional use of RUE during ADLs.   Goal status: IN PROGRESS  2.  Pt will be educated on AE available to improve independence and success with tedious ADLs.   Goal status: IN PROGRESS  3.  Pt will increase right grip strength by 15# and pinch strength by 4# to improve ability to grasp and hold items when carrying or moving room to room during ADLs or housework tasks.  Goal status: IN PROGRESS  4.  Pt will increase right hand coordination required for tedious manipulation tasks by completing 9 hole peg test in under 30   Goal status: IN PROGRESS    ASSESSMENT:  CLINICAL IMPRESSION: This session pt reporting not feeling great, but no worse than usual. Session focusing on remaining strength deficits and grip/pinch strength. Completing coordination tasks with mod to max difficulty and increased time. Pt reports  frustration during coordination work, rest breaks provided as needed. Verbal cuing for form and technique during tasks.     PERFORMANCE DEFICITS: in functional skills including ADLs, IADLs, coordination, dexterity, sensation, strength, pain, and UE functional use    PLAN:  OT FREQUENCY: 1x/week  OT DURATION: 4 weeks  PLANNED INTERVENTIONS: 97168 OT Re-evaluation, 97535 self care/ADL training, 02889 therapeutic exercise, 97530 therapeutic activity, 97112 neuromuscular re-education, patient/family education, and DME and/or AE instructions  RECOMMENDED OTHER SERVICES: Orthopedic referral to assess LUE  CONSULTED AND AGREED WITH PLAN OF CARE: Patient and family member/caregiver  PLAN FOR NEXT SESSION: Follow up on HEP, review AE and provide resources   [employer], OTR/L  [phone] 10/14/2023, 11:00 AM

## 2023-10-17 DIAGNOSIS — G5622 Lesion of ulnar nerve, left upper limb: Secondary | ICD-10-CM | POA: Diagnosis not present

## 2023-10-17 DIAGNOSIS — Z9889 Other specified postprocedural states: Secondary | ICD-10-CM | POA: Diagnosis not present

## 2023-10-17 DIAGNOSIS — M47812 Spondylosis without myelopathy or radiculopathy, cervical region: Secondary | ICD-10-CM | POA: Diagnosis not present

## 2023-10-17 DIAGNOSIS — M542 Cervicalgia: Secondary | ICD-10-CM | POA: Diagnosis not present

## 2023-10-18 ENCOUNTER — Encounter: Payer: Self-pay | Admitting: Neurology

## 2023-10-21 ENCOUNTER — Encounter (HOSPITAL_COMMUNITY): Admitting: Occupational Therapy

## 2023-10-21 ENCOUNTER — Ambulatory Visit: Admitting: Neurology

## 2023-10-28 ENCOUNTER — Encounter (HOSPITAL_COMMUNITY): Payer: Self-pay | Admitting: Occupational Therapy

## 2023-10-28 ENCOUNTER — Ambulatory Visit (HOSPITAL_COMMUNITY): Attending: Neurology | Admitting: Occupational Therapy

## 2023-10-28 DIAGNOSIS — R278 Other lack of coordination: Secondary | ICD-10-CM | POA: Insufficient documentation

## 2023-10-28 DIAGNOSIS — R29818 Other symptoms and signs involving the nervous system: Secondary | ICD-10-CM | POA: Diagnosis not present

## 2023-10-28 NOTE — Therapy (Signed)
 OUTPATIENT OCCUPATIONAL THERAPY NEURO TREATMENT NOTE    Patient Name: Bobby York MRN: [id number] DOB:1955-07-25, 68 y.o., male Today's Date: 10/28/2023   END OF SESSION:  OT End of Session - 10/28/23 1237     Visit Number 9    Number of Visits 13    Date for OT Re-Evaluation 11/18/23    Authorization Type 1) [employer], copay $45 2) [facility]    Authorization Time Period no auth    Progress Note Due on Visit 10    OT Start Time 1118    OT Stop Time 1201    OT Time Calculation (min) 43 min    Activity Tolerance Patient tolerated treatment well    Behavior During Therapy WFL for tasks assessed/performed          Past Medical History:  Diagnosis Date   Bipolar 1 disorder (HCC)    diagnosed 2004   Dyspnea    Hepatitis    Hep C   Substance abuse (HCC) 2021   Past Surgical History:  Procedure Laterality Date   BACK SURGERY     two-lumbar disc x2   CLAVICLE SURGERY     COLONOSCOPY WITH PROPOFOL  N/A 07/25/2014   MFM:dpwhoz rectal polyp otherwise normal . hyperplastic polyp. TCS in 07/2024   CRANIOTOMY N/A 03/12/2022   Procedure: ENDOSCOPIC ENDONASAL TRANSPHENOIDAL RESECTION OF PITUITARY MASS;  Surgeon: Debby Dorn MATSU, MD;  Location: [facility] OR;  Service: Neurosurgery;  Laterality: N/A;   ESOPHAGEAL DILATION N/A 07/25/2014   Procedure: ESOPHAGEAL DILATION;  Surgeon: Lamar CHRISTELLA Hollingshead, MD;  Location: [facility] OR[facility];  Service: Endoscopy;  Laterality: N/A;  Malony dilators # 56,   ESOPHAGOGASTRODUODENOSCOPY (EGD) WITH PROPOFOL  N/A 07/25/2014   MFM:fpoi erosive reflux/s/p dilator/HH. +hpylor    NECK SURGERY     TRANSPHENOIDAL APPROACH EXPOSURE N/A 03/12/2022   Procedure: TRANSPHENOIDAL ENDOSCOPIC RESCTION OF PITUITARY ADENOMA;  Surgeon: Llewellyn Gerard LABOR, DO;  Location: [facility] OR;  Service: ENT;  Laterality: N/A;   Patient Active Problem List   Diagnosis Date Noted   H/O partial hypophysectomy (HCC) 06/21/2022   Pituitary mass (HCC) 03/12/2022   Pituitary tumor 03/12/2022   DOE (dyspnea on  exertion) 08/27/2021   Pneumothorax, traumatic 04/14/2016   H. pylori infection 08/22/2014   Reflux esophagitis    Dysphagia, pharyngoesophageal phase    Mucosal abnormality of stomach    History of colonic polyps    Chronic hepatitis C (HCC) 07/09/2014   Abnormal weight loss 07/09/2014   History of ETOH abuse 07/09/2014   Esophageal dysphagia 07/09/2014   Odynophagia 07/09/2014   Abnormal ECG 05/28/2014   PCP: Dr. Norleen General  REFERRING PROVIDER: Dr. Pastor Falling  ONSET DATE: chronic  REFERRING DIAG: G20.B1 (ICD-10-CM) - Parkinson's disease with dyskinesia, unspecified whether manifestations fluctuate (HCC)   THERAPY DIAG:  Other lack of coordination  Other symptoms and signs involving the nervous system  Rationale for Evaluation and Treatment: Rehabilitation  SUBJECTIVE:   SUBJECTIVE STATEMENT: S: I'm dizzy as a June bug on a string. Pt accompanied by: self and significant other  PERTINENT HISTORY: Pt is a 68 y/o male presenting with Parkinson's disease, has resting tremors and decreased grip and pinch strength impacting use of dominant RUE during ADLs.   PRECAUTIONS: Other: BP issues  WEIGHT BEARING RESTRICTIONS: No  PAIN:  Are you having pain? Yes: NPRS scale: 8/10 Pain location: left elbow Pain description: aching Aggravating factors: use, sleeping with elbow bent Relieving factors: nothing  FALLS: Has patient fallen in last 6 months? No  PLOF: Independent  PATIENT GOALS: To have more use of the RUE  OBJECTIVE:  Note: Objective measures were completed at Evaluation unless otherwise noted.  HAND DOMINANCE: Right  ADLs: Overall ADLs: Pt is having difficulty with operating buttons, tying shoes, writing, eating. Pt is having difficulty putting socks. Pt reports weakness in both hands.   FUNCTIONAL OUTCOME MEASURES: Quick Dash: 50 09/08/23: 56.82 10/07/23: 54.55  UPPER EXTREMITY ROM:    A/ROM of RUE is WNL  UPPER EXTREMITY MMT:     MMT  Right eval Right 09/08/23 Right 10/07/23  Elbow flexion 5/5 5/5 5/5  Elbow extension 5/5 5/5 5/5  Wrist flexion 4-/5 5/5 5/5  Wrist extension 4/5 4/5 5/5  Wrist ulnar deviation 3+/5 4/5 5/5  Wrist radial deviation 3+/5 4/5 4+/5  Wrist pronation 4/5 5/5 5/5  Wrist supination 4/5 5/5 4/5  (Blank rows = not tested)  HAND FUNCTION: Grip strength: Right: 10 lbs; Left: 35 lbs, Lateral pinch: Right: 3 lbs, Left: 3 lbs, and 3 point pinch: Right: 3 lbs, Left: 3 lbs 09/08/23: Grip strength: Right: 20 lbs; Lateral pinch: Right: 3 lbs, and 3 point pinch: Right: 3 lbs 10/07/23: Grip strength: Right: 20 lbs; Lateral pinch: Right: 3 lbs, and 3 point pinch: Right: 3 lbs  COORDINATION: 9 Hole Peg test: Right: 38.37 sec; Left: 36.41 sec 09/08/23: 9 Hole Peg test: Right: 1 min 31 sec 10/07/23: 9 Hole Peg test: Right: 51.30 sec  SENSATION: WFL Subjective moderate to severe tingling/numbness in bilateral hands left>right  EDEMA: none  COGNITION: Overall cognitive status: Within functional limits for tasks assessed  OBSERVATIONS: Pt arrived with brace for lateral epicondylitis on proximal forearm of LUE.                                                                                                                              TREATMENT DATE:  10/28/23 -Wrist ROM: flexion, extension, ulnar/radial deviation, supination/pronation, x12 -Digit ROM: composite flexion, opposition, finger taps, x10,  unable to abduct -Thera-bar: teal bar, flexion/extension twists, supination bends, pronation bends, radial bends, ulnar bends, x15 -Theraputty: red, push 10 beads in, roll into ball, flatten and pinch/pull to find the 10 beads  10/14/23 -Strengthening: forearm supination, radial deviation-red theraband and 2# weight, 10 reps each -Hand gripper: large and medium beads with gripper vertical at 22# -Pinch task: pt using yellow clothespin and 3 point pinch to stack 3 towers of 5 sponges; alternating yellow and red  clothespins and lateral pinch to replace sponges back into bucket.  -Grooved pegboard: pt holding 4 pegs in palm and working on translating to fingertips to place into board. Max difficulty with translating due to digit stiffness, mostly shaking to achieve tip pinch. Increased time for completion.   10/07/23 -BP 94/73, HR 95, O2 97% -Wrist ROM: 2#, flexion, extension, ulnar/radial deviation, supination/pronation, x12 -Digit ROM: composite flexion, opposition, finger taps, x10,  unable to abduct -Thera-bar: yellow bar, flexion/extension twists, supination bends, pronation bends,  radial bends, ulnar bends, x15 -Pinch tree: red, green resistance clips, tripod up, lateral down -Reassessment for recertification   PATIENT EDUCATION: Education details: Continue HEP Person educated: Patient and Spouse Education method: Explanation and Handouts Education comprehension: verbalized understanding  HOME EXERCISE PROGRAM: Eval: yellow theraputty for grip/pinch 7/3: Wrist and Digit ROM    GOALS: Goals reviewed with patient? Yes  SHORT TERM GOALS: Target date: 09/17/23  Pt will be provided with and educated on HEP to improve functional use of RUE during ADLs.   Goal status: IN PROGRESS  2.  Pt will be educated on AE available to improve independence and success with tedious ADLs.   Goal status: IN PROGRESS  3.  Pt will increase right grip strength by 15# and pinch strength by 4# to improve ability to grasp and hold items when carrying or moving room to room during ADLs or housework tasks.  Goal status: IN PROGRESS  4.  Pt will increase right hand coordination required for tedious manipulation tasks by completing 9 hole peg test in under 30   Goal status: IN PROGRESS    ASSESSMENT:  CLINICAL IMPRESSION: Pt is demonstrating good improvements with his wrist strength and grip strength. He continues to have poor activity tolerance and requires frequent rest breaks. Overall coordination  also appears to be improving well. Verbal and tactile cuing provided for positioning and technique throughout session.    PERFORMANCE DEFICITS: in functional skills including ADLs, IADLs, coordination, dexterity, sensation, strength, pain, and UE functional use    PLAN:  OT FREQUENCY: 1x/week  OT DURATION: 4 weeks  PLANNED INTERVENTIONS: 97168 OT Re-evaluation, 97535 self care/ADL training, 02889 therapeutic exercise, 97530 therapeutic activity, 97112 neuromuscular re-education, patient/family education, and DME and/or AE instructions  RECOMMENDED OTHER SERVICES: Orthopedic referral to assess LUE  CONSULTED AND AGREED WITH PLAN OF CARE: Patient and family member/caregiver  PLAN FOR NEXT SESSION: Follow up on HEP, review AE and provide resources   Valentin Nightingale, OTR/L [phone] 10/28/2023, 12:53 PM

## 2023-11-01 DIAGNOSIS — M79674 Pain in right toe(s): Secondary | ICD-10-CM | POA: Diagnosis not present

## 2023-11-01 DIAGNOSIS — M79675 Pain in left toe(s): Secondary | ICD-10-CM | POA: Diagnosis not present

## 2023-11-01 DIAGNOSIS — B351 Tinea unguium: Secondary | ICD-10-CM | POA: Diagnosis not present

## 2023-11-04 ENCOUNTER — Ambulatory Visit (HOSPITAL_COMMUNITY): Admitting: Occupational Therapy

## 2023-11-04 ENCOUNTER — Encounter (HOSPITAL_COMMUNITY): Payer: Self-pay | Admitting: Occupational Therapy

## 2023-11-04 DIAGNOSIS — R278 Other lack of coordination: Secondary | ICD-10-CM

## 2023-11-04 DIAGNOSIS — R29818 Other symptoms and signs involving the nervous system: Secondary | ICD-10-CM | POA: Diagnosis not present

## 2023-11-04 NOTE — Therapy (Signed)
 OUTPATIENT OCCUPATIONAL THERAPY NEURO TREATMENT NOTE MINI REASSESSMENT    Patient Name: Bobby York MRN: [id number] DOB:Jun 11, 1955, 68 y.o., male Today's Date: 11/04/2023  Progress Note Reporting Period 10/07/23 to 11/04/23  See note below for Objective Data and Assessment of Progress/Goals.    END OF SESSION:  OT End of Session - 11/04/23 1413     Visit Number 10    Number of Visits 13    Date for OT Re-Evaluation 11/18/23    Authorization Type 1)[employer] medicare 2) [place]    Authorization Time Period Requesting 8 visits    Progress Note Due on Visit 10    OT Start Time 1119    OT Stop Time 1201    OT Time Calculation (min) 42 min    Activity Tolerance Patient tolerated treatment well    Behavior During Therapy WFL for tasks assessed/performed           Past Medical History:  Diagnosis Date   Bipolar 1 disorder (HCC)    diagnosed 2004   Dyspnea    Hepatitis    Hep C   Substance abuse (HCC) 2021   Past Surgical History:  Procedure Laterality Date   BACK SURGERY     two-lumbar disc x2   CLAVICLE SURGERY     COLONOSCOPY WITH PROPOFOL  Bobby/A 07/25/2014   MFM:dpwhoz rectal polyp otherwise normal . hyperplastic polyp. TCS in 07/2024   CRANIOTOMY Bobby/A 03/12/2022   Procedure: ENDOSCOPIC ENDONASAL TRANSPHENOIDAL RESECTION OF PITUITARY MASS;  Surgeon: Debby Dorn MATSU, MD;  Location: [facility] OR;  Service: Neurosurgery;  Laterality: Bobby/A;   ESOPHAGEAL DILATION Bobby/A 07/25/2014   Procedure: ESOPHAGEAL DILATION;  Surgeon: Lamar CHRISTELLA Hollingshead, MD;  Location: [facility] ORS;  Service: Endoscopy;  Laterality: Bobby/A;  Malony dilators # 56,   ESOPHAGOGASTRODUODENOSCOPY (EGD) WITH PROPOFOL  Bobby/A 07/25/2014   MFM:fpoi erosive reflux/s/p dilator/HH. +hpylor    NECK SURGERY     TRANSPHENOIDAL APPROACH EXPOSURE Bobby/A 03/12/2022   Procedure: TRANSPHENOIDAL ENDOSCOPIC RESCTION OF PITUITARY ADENOMA;  Surgeon: Llewellyn Gerard LABOR, DO;  Location: [facility] OR;  Service: ENT;  Laterality: Bobby/A;   Patient Active Problem List    Diagnosis Date Noted   H/O partial hypophysectomy (HCC) 06/21/2022   Pituitary mass (HCC) 03/12/2022   Pituitary tumor 03/12/2022   DOE (dyspnea on exertion) 08/27/2021   Pneumothorax, traumatic 04/14/2016   H. pylori infection 08/22/2014   Reflux esophagitis    Dysphagia, pharyngoesophageal phase    Mucosal abnormality of stomach    History of colonic polyps    Chronic hepatitis C (HCC) 07/09/2014   Abnormal weight loss 07/09/2014   History of ETOH abuse 07/09/2014   Esophageal dysphagia 07/09/2014   Odynophagia 07/09/2014   Abnormal ECG 05/28/2014   PCP: Dr. Norleen General  REFERRING PROVIDER: Dr. Pastor Falling  ONSET DATE: chronic  REFERRING DIAG: G20.B1 (ICD-10-CM) - Parkinson's disease with dyskinesia, unspecified whether manifestations fluctuate (HCC)   THERAPY DIAG:  Other lack of coordination  Other symptoms and signs involving the nervous system  Rationale for Evaluation and Treatment: Rehabilitation  SUBJECTIVE:   SUBJECTIVE STATEMENT: S: My arm is hurting bad today Pt accompanied by: self and significant other  PERTINENT HISTORY: Pt is a 68 y/o male presenting with Parkinson's disease, has resting tremors and decreased grip and pinch strength impacting use of dominant RUE during ADLs.   PRECAUTIONS: Other: BP issues  WEIGHT BEARING RESTRICTIONS: No  PAIN:  Are you having pain? Yes: NPRS scale: 8/10 Pain location: left elbow Pain description: aching Aggravating factors:  use, sleeping with elbow bent Relieving factors: nothing  FALLS: Has patient fallen in last 6 months? No  PLOF: Independent  PATIENT GOALS: To have more use of the RUE  OBJECTIVE:  Note: Objective measures were completed at Evaluation unless otherwise noted.  HAND DOMINANCE: Right  ADLs: Overall ADLs: Pt is having difficulty with operating buttons, tying shoes, writing, eating. Pt is having difficulty putting socks. Pt reports weakness in both hands.   FUNCTIONAL OUTCOME  MEASURES: Quick Dash: 50 09/08/23: 56.82 10/07/23: 54.55 11/04/23: 50  UPPER EXTREMITY ROM:    A/ROM of RUE is WNL  UPPER EXTREMITY MMT:     MMT Right eval Right 09/08/23 Right 10/07/23 Right 11/04/23  Elbow flexion 5/5 5/5 5/5 5/5  Elbow extension 5/5 5/5 5/5 5/5  Wrist flexion 4-/5 5/5 5/5 5/5  Wrist extension 4/5 4/5 5/5 5/5  Wrist ulnar deviation 3+/5 4/5 5/5 5/5  Wrist radial deviation 3+/5 4/5 4+/5 5/5  Wrist pronation 4/5 5/5 5/5 5/5  Wrist supination 4/5 5/5 4/5 5/5  (Blank rows = not tested)  HAND FUNCTION: Grip strength: Right: 10 lbs; Left: 35 lbs, Lateral pinch: Right: 3 lbs, Left: 3 lbs, and 3 point pinch: Right: 3 lbs, Left: 3 lbs 09/08/23: Grip strength: Right: 20 lbs; Lateral pinch: Right: 3 lbs, and 3 point pinch: Right: 3 lbs 10/07/23: Grip strength: Right: 20 lbs; Lateral pinch: Right: 3 lbs, and 3 point pinch: Right: 3 lbs 11/04/23: Grip strength: Right: 20 lbs; Lateral pinch: Right: 4 lbs, and 3 point pinch: Right: 4 lbs  COORDINATION: 9 Hole Peg test: Right: 38.37 sec; Left: 36.41 sec 09/08/23: 9 Hole Peg test: Right: 1 min 31 sec 10/07/23: 9 Hole Peg test: Right: 51.30 sec 11/04/23: 9 Hole Peg test: Right: 48.81  SENSATION: WFL Subjective moderate to severe tingling/numbness in bilateral hands left>right  EDEMA: none  COGNITION: Overall cognitive status: Within functional limits for tasks assessed  OBSERVATIONS: Pt arrived with brace for lateral epicondylitis on proximal forearm of LUE.                                                                                                                              TREATMENT DATE:  11/04/23 -Wrist Strengthening: 2#, flexion, extension, ulnar/radial deviation, supination/pronation, x15 -Gripper: 15# medium and small beads -Stacking cubes: red resistance clips stacking and unstacking 4 stacks of 5 cubes -Sponges: 15, 19 -9 Hole Peg Test -Reassessment  10/28/23 -Wrist ROM: flexion, extension, ulnar/radial  deviation, supination/pronation, x12 -Digit ROM: composite flexion, opposition, finger taps, x10,  unable to abduct -Thera-bar: teal bar, flexion/extension twists, supination bends, pronation bends, radial bends, ulnar bends, x15 -Theraputty: red, push 10 beads in, roll into ball, flatten and pinch/pull to find the 10 beads  10/14/23 -Strengthening: forearm supination, radial deviation-red theraband and 2# weight, 10 reps each -Hand gripper: large and medium beads with gripper vertical at 22# -Pinch task: pt using yellow clothespin and 3 point pinch to stack 3  towers of 5 sponges; alternating yellow and red clothespins and lateral pinch to replace sponges back into bucket.  -Grooved pegboard: pt holding 4 pegs in palm and working on translating to fingertips to place into board. Max difficulty with translating due to digit stiffness, mostly shaking to achieve tip pinch. Increased time for completion.   PATIENT EDUCATION: Education details: Continue HEP Person educated: Patient and Spouse Education method: Chief Technology [employer] Education comprehension: verbalized understanding  HOME EXERCISE PROGRAM: Eval: yellow theraputty for grip/pinch 7/3: Wrist and Digit ROM   GOALS: Goals reviewed with patient? Yes  SHORT TERM GOALS: Target date: 09/17/23  Pt will be provided with and educated on HEP to improve functional use of RUE during ADLs.   Goal status: IN PROGRESS  2.  Pt will be educated on AE available to improve independence and success with tedious ADLs.   Goal status: IN PROGRESS  3.  Pt will increase right grip strength by 15# and pinch strength by 4# to improve ability to grasp and hold items when carrying or moving room to room during ADLs or housework tasks.  Goal status: IN PROGRESS  4.  Pt will increase right hand coordination required for tedious manipulation tasks by completing 9 hole peg test in under 30   Goal status: IN PROGRESS    ASSESSMENT:  CLINICAL  IMPRESSION: Pt completed mini reassessment this session where he demonstrated improving strength in his wrist, however his grip and pinch remain the same. Additionally, his coordination is making good improvements, however still limited. Sessions focusing on grip and pinch strength with motor planning for coordination. OT providing verbal and tactile cuing for positioning and technique throughout session.    PERFORMANCE DEFICITS: in functional skills including ADLs, IADLs, coordination, dexterity, sensation, strength, pain, and UE functional use    PLAN:  OT FREQUENCY: 1x/week  OT DURATION: 4 weeks  PLANNED INTERVENTIONS: 97168 OT Re-evaluation, 97535 self care/ADL training, 02889 therapeutic exercise, 97530 therapeutic activity, 97112 neuromuscular re-education, patient/family education, and DME and/or AE instructions  RECOMMENDED OTHER SERVICES: Orthopedic referral to assess LUE  CONSULTED AND AGREED WITH PLAN OF CARE: Patient and family member/caregiver  PLAN FOR NEXT SESSION: Follow up on HEP, review AE and provide resources   Valentin Nightingale, OTR/L [phone] 11/04/2023, 2:14 PM  [employer] Auth Request Treatment Start Date: 11/04/23  Referring diagnosis code (ICD 10)? G20.B1 (ICD-10-CM) - Parkinson's disease with dyskinesia, unspecified whether manifestations fluctuate (HCC)  Treatment diagnosis codes (ICD 10)? (if different than referring diagnosis) R27.8, R29.818  What was this (referring dx) caused by? []  Surgery []  Fall [x]  Ongoing issue []  Arthritis []  Other: ____________  Laterality: []  Rt [x]  Lt []  Both  Deficits: [x]  Pain [x]  Stiffness [x]  Weakness []  Edema []  Balance Deficits [x]  Coordination []  Gait Disturbance [x]  ROM []  Other   Functional Tool Score: Quick Dash - 50  CPT codes: See Planned Interventions listed in the Plan section of the Evaluation.

## 2023-11-11 ENCOUNTER — Encounter (HOSPITAL_COMMUNITY): Payer: Self-pay | Admitting: Occupational Therapy

## 2023-11-11 ENCOUNTER — Ambulatory Visit (HOSPITAL_COMMUNITY): Admitting: Occupational Therapy

## 2023-11-11 DIAGNOSIS — R278 Other lack of coordination: Secondary | ICD-10-CM

## 2023-11-11 DIAGNOSIS — R29818 Other symptoms and signs involving the nervous system: Secondary | ICD-10-CM

## 2023-11-11 NOTE — Therapy (Signed)
 OUTPATIENT OCCUPATIONAL THERAPY NEURO TREATMENT NOTE   Patient Name: Bobby York MRN: [id number] DOB:19-Mar-1955, 68 y.o., male Today's Date: 11/11/2023  END OF SESSION:  OT End of Session - 11/11/23 1208     Visit Number 11    Number of Visits 13    Date for Recertification  11/18/23    Authorization Type 1)[employer] medicare 2) [place]    Authorization Time Period 12 visits (08/25/23-11/25/23)    Authorization - Visit Number 10    Authorization - Number of Visits 12    Progress Note Due on Visit 10    OT Start Time 1123    OT Stop Time 1202    OT Time Calculation (min) 39 min    Activity Tolerance Patient tolerated treatment well    Behavior During Therapy [facility] for tasks assessed/performed            Past Medical History:  Diagnosis Date   Bipolar 1 disorder (HCC)    diagnosed 2004   Dyspnea    Hepatitis    Hep C   Substance abuse (HCC) 2021   Past Surgical History:  Procedure Laterality Date   BACK SURGERY     two-lumbar disc x2   CLAVICLE SURGERY     COLONOSCOPY WITH PROPOFOL  N/A 07/25/2014   MFM:dpwhoz rectal polyp otherwise normal . hyperplastic polyp. TCS in 07/2024   CRANIOTOMY N/A 03/12/2022   Procedure: ENDOSCOPIC ENDONASAL TRANSPHENOIDAL RESECTION OF PITUITARY MASS;  Surgeon: Debby Dorn MATSU, MD;  Location: [facility] OR;  Service: Neurosurgery;  Laterality: N/A;   ESOPHAGEAL DILATION N/A 07/25/2014   Procedure: ESOPHAGEAL DILATION;  Surgeon: Lamar CHRISTELLA Hollingshead, MD;  Location: [facility] ORS;  Service: Endoscopy;  Laterality: N/A;  Malony dilators # 56,   ESOPHAGOGASTRODUODENOSCOPY (EGD) WITH PROPOFOL  N/A 07/25/2014   MFM:fpoi erosive reflux/s/p dilator/HH. +hpylor    NECK SURGERY     TRANSPHENOIDAL APPROACH EXPOSURE N/A 03/12/2022   Procedure: TRANSPHENOIDAL ENDOSCOPIC RESCTION OF PITUITARY ADENOMA;  Surgeon: Llewellyn Gerard LABOR, DO;  Location: [facility] OR;  Service: ENT;  Laterality: N/A;   Patient Active Problem List   Diagnosis Date Noted   H/O partial hypophysectomy (HCC) 06/21/2022    Pituitary mass (HCC) 03/12/2022   Pituitary tumor 03/12/2022   DOE (dyspnea on exertion) 08/27/2021   Pneumothorax, traumatic 04/14/2016   H. pylori infection 08/22/2014   Reflux esophagitis    Dysphagia, pharyngoesophageal phase    Mucosal abnormality of stomach    History of colonic polyps    Chronic hepatitis C (HCC) 07/09/2014   Abnormal weight loss 07/09/2014   History of ETOH abuse 07/09/2014   Esophageal dysphagia 07/09/2014   Odynophagia 07/09/2014   Abnormal ECG 05/28/2014   PCP: Dr. Norleen General  REFERRING PROVIDER: Dr. Pastor Falling  ONSET DATE: chronic  REFERRING DIAG: G20.B1 (ICD-10-CM) - Parkinson's disease with dyskinesia, unspecified whether manifestations fluctuate (HCC)   THERAPY DIAG:  Other lack of coordination  Other symptoms and signs involving the nervous system  Rationale for Evaluation and Treatment: Rehabilitation  SUBJECTIVE:   SUBJECTIVE STATEMENT: S: I'm tired today Pt accompanied by: self and significant other  PERTINENT HISTORY: Pt is a 68 y/o male presenting with Parkinson's disease, has resting tremors and decreased grip and pinch strength impacting use of dominant RUE during ADLs.   PRECAUTIONS: Other: BP issues  WEIGHT BEARING RESTRICTIONS: No  PAIN:  Are you having pain? Yes: NPRS scale: 8/10 Pain location: left elbow Pain description: aching Aggravating factors: use, sleeping with elbow bent Relieving factors: nothing  FALLS: Has patient fallen in last 6 months? No  PLOF: Independent  PATIENT GOALS: To have more use of the RUE  OBJECTIVE:  Note: Objective measures were completed at Evaluation unless otherwise noted.  HAND DOMINANCE: Right  ADLs: Overall ADLs: Pt is having difficulty with operating buttons, tying shoes, writing, eating. Pt is having difficulty putting socks. Pt reports weakness in both hands.   FUNCTIONAL OUTCOME MEASURES: Quick Dash: 50 09/08/23: 56.82 10/07/23: 54.55 11/04/23: 50  UPPER  EXTREMITY ROM:    A/ROM of RUE is WNL  UPPER EXTREMITY MMT:     MMT Right eval Right 09/08/23 Right 10/07/23 Right 11/04/23  Elbow flexion 5/5 5/5 5/5 5/5  Elbow extension 5/5 5/5 5/5 5/5  Wrist flexion 4-/5 5/5 5/5 5/5  Wrist extension 4/5 4/5 5/5 5/5  Wrist ulnar deviation 3+/5 4/5 5/5 5/5  Wrist radial deviation 3+/5 4/5 4+/5 5/5  Wrist pronation 4/5 5/5 5/5 5/5  Wrist supination 4/5 5/5 4/5 5/5  (Blank rows = not tested)  HAND FUNCTION: Grip strength: Right: 10 lbs; Left: 35 lbs, Lateral pinch: Right: 3 lbs, Left: 3 lbs, and 3 point pinch: Right: 3 lbs, Left: 3 lbs 09/08/23: Grip strength: Right: 20 lbs; Lateral pinch: Right: 3 lbs, and 3 point pinch: Right: 3 lbs 10/07/23: Grip strength: Right: 20 lbs; Lateral pinch: Right: 3 lbs, and 3 point pinch: Right: 3 lbs 11/04/23: Grip strength: Right: 20 lbs; Lateral pinch: Right: 4 lbs, and 3 point pinch: Right: 4 lbs  COORDINATION: 9 Hole Peg test: Right: 38.37 sec; Left: 36.41 sec 09/08/23: 9 Hole Peg test: Right: 1 min 31 sec 10/07/23: 9 Hole Peg test: Right: 51.30 sec 11/04/23: 9 Hole Peg test: Right: 48.81  SENSATION: WFL Subjective moderate to severe tingling/numbness in bilateral hands left>right  EDEMA: none  COGNITION: Overall cognitive status: Within functional limits for tasks assessed  OBSERVATIONS: Pt arrived with brace for lateral epicondylitis on proximal forearm of LUE.                                                                                                                              TREATMENT DATE:  11/11/23 -Theraputty: green, roll into ball, flatten into pancake, PVC pipe to cut circles, roll into log, tripod pinch, lateral pinch, roll into ball, squeeze. -Grooved pegboard: pt holding 4 pegs in palm and working on translating to fingertips to place into board. Max difficulty with translating due to digit stiffness, mostly shaking to achieve tip pinch. Increased time for completion.  -Pinch tree: red,  green, blue resistance clips, tripod up, lateral down -Digiflex: 3#, mod to max difficulty  11/04/23 -Wrist Strengthening: 2#, flexion, extension, ulnar/radial deviation, supination/pronation, x15 -Gripper: 15# medium and small beads -Stacking cubes: red resistance clips stacking and unstacking 4 stacks of 5 cubes -Sponges: 15, 19 -9 Hole Peg Test -Reassessment  10/28/23 -Wrist ROM: flexion, extension, ulnar/radial deviation, supination/pronation, x12 -Digit ROM: composite flexion, opposition, finger taps, x10,  unable  to abduct -Thera-bar: teal bar, flexion/extension twists, supination bends, pronation bends, radial bends, ulnar bends, x15 -Theraputty: red, push 10 beads in, roll into ball, flatten and pinch/pull to find the 10 beads  10/14/23 -Strengthening: forearm supination, radial deviation-red theraband and 2# weight, 10 reps each -Hand gripper: large and medium beads with gripper vertical at 22# -Pinch task: pt using yellow clothespin and 3 point pinch to stack 3 towers of 5 sponges; alternating yellow and red clothespins and lateral pinch to replace sponges back into bucket.  -Grooved pegboard: pt holding 4 pegs in palm and working on translating to fingertips to place into board. Max difficulty with translating due to digit stiffness, mostly shaking to achieve tip pinch. Increased time for completion.   PATIENT EDUCATION: Education details: Continue HEP Person educated: Patient and Spouse Education method: Chief Technology Officer Education comprehension: verbalized understanding  HOME EXERCISE PROGRAM: Eval: yellow theraputty for grip/pinch 7/3: Wrist and Digit ROM   GOALS: Goals reviewed with patient? Yes  SHORT TERM GOALS: Target date: 09/17/23  Pt will be provided with and educated on HEP to improve functional use of RUE during ADLs.   Goal status: IN PROGRESS  2.  Pt will be educated on AE available to improve independence and success with tedious ADLs.   Goal  status: IN PROGRESS  3.  Pt will increase right grip strength by 15# and pinch strength by 4# to improve ability to grasp and hold items when carrying or moving room to room during ADLs or housework tasks.  Goal status: IN PROGRESS  4.  Pt will increase right hand coordination required for tedious manipulation tasks by completing 9 hole peg test in under 30   Goal status: IN PROGRESS    ASSESSMENT:  CLINICAL IMPRESSION: Pt continuing to work on his grip and pinch strength, as well as coordination. Grip and pinch are consistent and his coordination remains difficult with in had translation. He feels his fingers are still weak with limited mobility. OT providing verbal and tactile cuing for positioning and technique.   PERFORMANCE DEFICITS: in functional skills including ADLs, IADLs, coordination, dexterity, sensation, strength, pain, and UE functional use    PLAN:  OT FREQUENCY: 1x/week  OT DURATION: 4 weeks  PLANNED INTERVENTIONS: 97168 OT Re-evaluation, 97535 self care/ADL training, 02889 therapeutic exercise, 97530 therapeutic activity, 97112 neuromuscular re-education, patient/family education, and DME and/or AE instructions  RECOMMENDED OTHER SERVICES: Orthopedic referral to assess LUE  CONSULTED AND AGREED WITH PLAN OF CARE: Patient and family member/caregiver  PLAN FOR NEXT SESSION: Follow up on HEP, review AE and provide resources   Valentin Nightingale, OTR/L [phone] 11/11/2023, 12:09 PM  [employer] Auth Request Treatment Start Date: 11/04/23  Referring diagnosis code (ICD 10)? G20.B1 (ICD-10-CM) - Parkinson's disease with dyskinesia, unspecified whether manifestations fluctuate (HCC)  Treatment diagnosis codes (ICD 10)? (if different than referring diagnosis) R27.8, R29.818  What was this (referring dx) caused by? []  Surgery []  Fall [x]  Ongoing issue []  Arthritis []  Other: ____________  Laterality: []  Rt [x]  Lt []  Both  Deficits: [x]  Pain [x]   Stiffness [x]  Weakness []  Edema []  Balance Deficits [x]  Coordination []  Gait Disturbance [x]  ROM []  Other   Functional Tool Score: Quick Dash - 50  CPT codes: See Planned Interventions listed in the Plan section of the Evaluation.

## 2023-11-16 ENCOUNTER — Ambulatory Visit (HOSPITAL_COMMUNITY): Admitting: Occupational Therapy

## 2023-11-16 ENCOUNTER — Encounter (HOSPITAL_COMMUNITY): Payer: Self-pay | Admitting: Occupational Therapy

## 2023-11-16 DIAGNOSIS — R278 Other lack of coordination: Secondary | ICD-10-CM

## 2023-11-16 DIAGNOSIS — R29818 Other symptoms and signs involving the nervous system: Secondary | ICD-10-CM

## 2023-11-16 NOTE — Therapy (Signed)
 OUTPATIENT OCCUPATIONAL THERAPY NEURO TREATMENT NOTE DISCHARGE NOTE   Patient Name: Bobby York MRN: [id number] DOB:[birth year], 68 y.o., male Today's Date: 11/16/2023  OCCUPATIONAL THERAPY DISCHARGE SUMMARY  Visits from Start of Care: 12  Current functional level related to goals / functional outcomes: Pt's wrist strength is [facility]    Remaining deficits: Grip strength remains weak.    Education / Equipment: Pt provided comprehensive HEP   Plan: Patient agrees to discharge.      END OF SESSION:  OT End of Session - 11/16/23 1445     Visit Number 12    Number of Visits 13    Date for Recertification  11/18/23    Authorization Type 1)[employer] medicare 2) [facility]    Authorization Time Period 12 visits (08/25/23-11/25/23)    Authorization - Visit Number 11    Authorization - Number of Visits 12    Progress Note Due on Visit 10    OT Start Time 1307    OT Stop Time 1349    OT Time Calculation (min) 42 min    Activity Tolerance Patient tolerated treatment well    Behavior During Therapy WFL for tasks assessed/performed          Past Medical History:  Diagnosis Date   Bipolar 1 disorder (HCC)    diagnosed 2004   Dyspnea    Hepatitis    Hep C   Substance abuse (HCC) 2021   Past Surgical History:  Procedure Laterality Date   BACK SURGERY     two-lumbar disc x2   CLAVICLE SURGERY     COLONOSCOPY WITH PROPOFOL  N/A 07/25/2014   MFM:dpwhoz rectal polyp otherwise normal . hyperplastic polyp. TCS in 07/2024   CRANIOTOMY N/A 03/12/2022   Procedure: ENDOSCOPIC ENDONASAL TRANSPHENOIDAL RESECTION OF PITUITARY MASS;  Surgeon: Debby Dorn MATSU, MD;  Location: [facility] OR;  Service: Neurosurgery;  Laterality: N/A;   ESOPHAGEAL DILATION N/A 07/25/2014   Procedure: ESOPHAGEAL DILATION;  Surgeon: Lamar CHRISTELLA Hollingshead, MD;  Location: [facility] ORS;  Service: Endoscopy;  Laterality: N/A;  Malony dilators # 56,   ESOPHAGOGASTRODUODENOSCOPY (EGD) WITH PROPOFOL  N/A 07/25/2014   MFM:fpoi erosive reflux/s/p  dilator/HH. +hpylor    NECK SURGERY     TRANSPHENOIDAL APPROACH EXPOSURE N/A 03/12/2022   Procedure: TRANSPHENOIDAL ENDOSCOPIC RESCTION OF PITUITARY ADENOMA;  Surgeon: Llewellyn Gerard LABOR, DO;  Location: [facility] OR;  Service: ENT;  Laterality: N/A;   Patient Active Problem List   Diagnosis Date Noted   H/O partial hypophysectomy 06/21/2022   Pituitary mass 03/12/2022   Pituitary tumor 03/12/2022   DOE (dyspnea on exertion) 08/27/2021   Pneumothorax, traumatic 04/14/2016   H. pylori infection 08/22/2014   Reflux esophagitis    Dysphagia, pharyngoesophageal phase    Mucosal abnormality of stomach    History of colonic polyps    Chronic hepatitis C (HCC) 07/09/2014   Abnormal weight loss 07/09/2014   History of ETOH abuse 07/09/2014   Esophageal dysphagia 07/09/2014   Odynophagia 07/09/2014   Abnormal ECG 05/28/2014   PCP: Dr. Norleen General  REFERRING PROVIDER: Dr. Pastor Falling  ONSET DATE: chronic  REFERRING DIAG: G20.B1 (ICD-10-CM) - Parkinson's disease with dyskinesia, unspecified whether manifestations fluctuate (HCC)   THERAPY DIAG:  Other lack of coordination  Other symptoms and signs involving the nervous system  Rationale for Evaluation and Treatment: Rehabilitation  SUBJECTIVE:   SUBJECTIVE STATEMENT: S: I'm always hurting Pt accompanied by: self and significant other  PERTINENT HISTORY: Pt is a 68 y/o male presenting with Parkinson's disease, has  resting tremors and decreased grip and pinch strength impacting use of dominant RUE during ADLs.   PRECAUTIONS: Other: BP issues  WEIGHT BEARING RESTRICTIONS: No  PAIN:  Are you having pain? Yes: NPRS scale: 7/10 Pain location: left elbow Pain description: aching Aggravating factors: use, sleeping with elbow bent Relieving factors: nothing  FALLS: Has patient fallen in last 6 months? No  PLOF: Independent  PATIENT GOALS: To have more use of the RUE  OBJECTIVE:  Note: Objective measures were completed at  Evaluation unless otherwise noted.  HAND DOMINANCE: Right  ADLs: Overall ADLs: Pt is having difficulty with operating buttons, tying shoes, writing, eating. Pt is having difficulty putting socks. Pt reports weakness in both hands.   FUNCTIONAL OUTCOME MEASURES: Quick Dash: 50 09/08/23: 56.82 10/07/23: 54.55 11/04/23: 50  UPPER EXTREMITY ROM:    A/ROM of RUE is WNL  UPPER EXTREMITY MMT:     MMT Right eval Right 09/08/23 Right 10/07/23 Right 11/04/23 Right 11/16/23  Elbow flexion 5/5 5/5 5/5 5/5 5/5  Elbow extension 5/5 5/5 5/5 5/5 5/5  Wrist flexion 4-/5 5/5 5/5 5/5 5/5  Wrist extension 4/5 4/5 5/5 5/5 5/5  Wrist ulnar deviation 3+/5 4/5 5/5 5/5 5/5  Wrist radial deviation 3+/5 4/5 4+/5 5/5 5/5  Wrist pronation 4/5 5/5 5/5 5/5 5/5  Wrist supination 4/5 5/5 4/5 5/5 5/5  (Blank rows = not tested)  HAND FUNCTION: Grip strength: Right: 10 lbs; Left: 35 lbs, Lateral pinch: Right: 3 lbs, Left: 3 lbs, and 3 point pinch: Right: 3 lbs, Left: 3 lbs 09/08/23: Grip strength: Right: 20 lbs; Lateral pinch: Right: 3 lbs, and 3 point pinch: Right: 3 lbs 10/07/23: Grip strength: Right: 20 lbs; Lateral pinch: Right: 3 lbs, and 3 point pinch: Right: 3 lbs 11/04/23: Grip strength: Right: 20 lbs; Lateral pinch: Right: 4 lbs, and 3 point pinch: Right: 4 lbs 11/16/23: Grip strength: Right: 22 lbs; Lateral pinch: Right: 4 lbs, and 3 point pinch: Right: 4 lbs  COORDINATION: 9 Hole Peg test: Right: 38.37 sec; Left: 36.41 sec 09/08/23: 9 Hole Peg test: Right: 1 min 31 sec 10/07/23: 9 Hole Peg test: Right: 51.30 sec 11/04/23: 9 Hole Peg test: Right: 48.81 sec 11/16/23: 9 Hole Peg test: Right: 40.00 sec  SENSATION: WFL Subjective moderate to severe tingling/numbness in bilateral hands left>right  EDEMA: none  COGNITION: Overall cognitive status: Within functional limits for tasks assessed  OBSERVATIONS: Pt arrived with brace for lateral epicondylitis on proximal forearm of LUE.                                                                                                                               TREATMENT DATE:  11/16/23 -Wrist ROM: extension, flexion, supination/pronation, ulnar/radial deviation, x10 -Gripper: 22# medium beads, 25# medium beads -eggsercizer: blue squeeze and hold x10, thumb presses x10, lateral pinch x10, tripod pinch x10 -9 hole peg test -Pinch strengthening: red resistance clip, 4 stacks of 5 cubes  11/11/23 -Theraputty: green, roll into ball, flatten into pancake, PVC pipe to cut circles, roll into log, tripod pinch, lateral pinch, roll into ball, squeeze. -Grooved pegboard: pt holding 4 pegs in palm and working on translating to fingertips to place into board. Max difficulty with translating due to digit stiffness, mostly shaking to achieve tip pinch. Increased time for completion.  -Pinch tree: red, green, blue resistance clips, tripod up, lateral down -Digiflex: 3#, mod to max difficulty  11/04/23 -Wrist Strengthening: 2#, flexion, extension, ulnar/radial deviation, supination/pronation, x15 -Gripper: 15# medium and small beads -Stacking cubes: red resistance clips stacking and unstacking 4 stacks of 5 cubes -Sponges: 15, 19 -9 Hole Peg Test -Reassessment   PATIENT EDUCATION: Education details: Continue HEP Person educated: Patient and Spouse Education method: Chief Technology Officer Education comprehension: verbalized understanding  HOME EXERCISE PROGRAM: Eval: yellow theraputty for grip/pinch 7/3: Wrist and Digit ROM   GOALS: Goals reviewed with patient? Yes  SHORT TERM GOALS: Target date: 09/17/23  Pt will be provided with and educated on HEP to improve functional use of RUE during ADLs.   Goal status: MET  2.  Pt will be educated on AE available to improve independence and success with tedious ADLs.   Goal status: MET  3.  Pt will increase right grip strength by 15# and pinch strength by 4# to improve ability to grasp and hold items  when carrying or moving room to room during ADLs or housework tasks.  Goal status: NOT MET  4.  Pt will increase right hand coordination required for tedious manipulation tasks by completing 9 hole peg test in under 30   Goal status:  NOT MET    ASSESSMENT:  CLINICAL IMPRESSION: Pt completed reassessment for discharge this session. He has made incremental/minimal progress with grip and pinch strength. His coordination has improved, however still limited. OT and pt reviewed exercises and completed grip and pinch strengthening this session. Pt agreed to discharge.   PERFORMANCE DEFICITS: in functional skills including ADLs, IADLs, coordination, dexterity, sensation, strength, pain, and UE functional use    PLAN:  OT FREQUENCY: 1x/week  OT DURATION: 4 weeks  PLANNED INTERVENTIONS: 97168 OT Re-evaluation, 97535 self care/ADL training, 02889 therapeutic exercise, 97530 therapeutic activity, 97112 neuromuscular re-education, patient/family education, and DME and/or AE instructions  RECOMMENDED OTHER SERVICES: Orthopedic referral to assess LUE  CONSULTED AND AGREED WITH PLAN OF CARE: Patient and family member/caregiver  PLAN FOR NEXT SESSION:  Discharge   Valentin Nightingale, OTR/L [phone] 11/16/2023, 3:02 PM

## 2023-11-18 ENCOUNTER — Encounter (HOSPITAL_COMMUNITY): Admitting: Occupational Therapy

## 2023-11-25 ENCOUNTER — Encounter (HOSPITAL_COMMUNITY): Admitting: Occupational Therapy

## 2023-11-29 HISTORY — PX: CARPAL TUNNEL WITH CUBITAL TUNNEL: SHX5608

## 2023-12-19 ENCOUNTER — Telehealth: Payer: Self-pay | Admitting: "Endocrinology

## 2023-12-19 DIAGNOSIS — E893 Postprocedural hypopituitarism: Secondary | ICD-10-CM

## 2023-12-19 NOTE — Telephone Encounter (Signed)
 Pt needs labs updated

## 2023-12-19 NOTE — Telephone Encounter (Signed)
Lab orders updated and sent to [place].

## 2023-12-23 LAB — T4, FREE: Free T4: 1.1 ng/dL (ref 0.82–1.77)

## 2023-12-23 LAB — TSH: TSH: 1.32 u[IU]/mL (ref 0.450–4.500)

## 2023-12-23 LAB — CORTISOL-AM, BLOOD: Cortisol - AM: 8.8 ug/dL (ref 6.2–19.4)

## 2023-12-23 LAB — PROLACTIN: Prolactin: 17.4 ng/mL (ref 3.6–25.2)

## 2023-12-30 ENCOUNTER — Encounter: Payer: Self-pay | Admitting: "Endocrinology

## 2023-12-30 ENCOUNTER — Ambulatory Visit: Payer: No Typology Code available for payment source | Admitting: "Endocrinology

## 2023-12-30 VITALS — BP 122/76 | HR 95 | Resp 18 | Ht 70.0 in | Wt 189.0 lb

## 2023-12-30 DIAGNOSIS — Z9889 Other specified postprocedural states: Secondary | ICD-10-CM | POA: Diagnosis not present

## 2023-12-30 DIAGNOSIS — E893 Postprocedural hypopituitarism: Secondary | ICD-10-CM

## 2023-12-30 NOTE — Progress Notes (Signed)
 12/30/2023, 9:03 AM  Endocrinology follow-up note   Subjective:    Patient ID: Bobby York, male    DOB: 1955-04-29, PCP Marvine Rush, MD   Past Medical History:  Diagnosis Date   Bipolar 1 disorder [facility])    diagnosed 2004   Dyspnea    Hepatitis    Hep C   Substance abuse (HCC) 2021   Past Surgical History:  Procedure Laterality Date   BACK SURGERY     two-lumbar disc x2   CARPAL TUNNEL WITH CUBITAL TUNNEL Left 11/29/2023   CLAVICLE SURGERY     COLONOSCOPY WITH PROPOFOL  N/A 07/25/2014   MFM:dpwhoz rectal polyp otherwise normal . hyperplastic polyp. TCS in 07/2024   CRANIOTOMY N/A 03/12/2022   Procedure: ENDOSCOPIC ENDONASAL TRANSPHENOIDAL RESECTION OF PITUITARY MASS;  Surgeon: Debby Dorn MATSU, MD;  Location: [facility] OR;  Service: Neurosurgery;  Laterality: N/A;   ESOPHAGEAL DILATION N/A 07/25/2014   Procedure: ESOPHAGEAL DILATION;  Surgeon: Lamar CHRISTELLA Hollingshead, MD;  Location: [facility] ORS;  Service: Endoscopy;  Laterality: N/A;  Malony dilators # 56,   ESOPHAGOGASTRODUODENOSCOPY (EGD) WITH PROPOFOL  N/A 07/25/2014   MFM:fpoi erosive reflux/s/p dilator/HH. +hpylor    NECK SURGERY     TRANSPHENOIDAL APPROACH EXPOSURE N/A 03/12/2022   Procedure: TRANSPHENOIDAL ENDOSCOPIC RESCTION OF PITUITARY ADENOMA;  Surgeon: Llewellyn Gerard LABOR, DO;  Location: [facility] OR;  Service: ENT;  Laterality: N/A;   Social History   Socioeconomic History   Marital status: Married    Spouse name: Not on file   Number of children: Not on file   Years of education: Not on file   Highest education level: Not on file  Occupational History   Not on file  Tobacco Use   Smoking status: Former    Current packs/day: 0.00    Average packs/day: 0.4 packs/day for 50.8 years (20.3 ttl pk-yrs)    Types: Cigarettes    Start date: 05/28/1970    Quit date: 03/2021    Years since quitting: 2.7   Smokeless tobacco: Never   Tobacco comments:    Pt states he stopped  smoking about 4-5 months ago // mr CMA 08/27/2021   Vaping Use   Vaping status: Never Used  Substance and Sexual Activity   Alcohol use: Not Currently    Comment: AS of today, last use in 2021   Drug use: Not Currently    Types: Marijuana    Comment: AS of today, last use in 2021   Sexual activity: Yes    Birth control/protection: None  Other Topics Concern   Not on file  Social History Narrative   Not on file   Social Drivers of Health   Financial Resource Strain: Not on file  Food Insecurity: Not on file  Transportation Needs: Not on file  Physical Activity: Not on file  Stress: Not on file  Social Connections: Unknown (07/07/2021)   [facility]   Social Network    Social Network: Not on file   Family History  Problem Relation Age of Onset   Heart disease Mother    Diabetes Mother    Prostate cancer Father    Liver disease Neg Hx    Colon cancer Neg Hx  Outpatient Encounter Medications as of 12/30/2023  Medication Sig   carbidopa -levodopa  (SINEMET  IR) 25-100 MG tablet Take 1 tablet by mouth 4 (four) times daily.   citalopram  (CELEXA ) 20 MG tablet Take 1 tablet (20 mg total) by mouth daily.   fludrocortisone  (FLORINEF ) 0.1 MG tablet Take 1 tablet (100 mcg total) by mouth 2 (two) times daily.   Omega-3 Fatty Acids (FISH OIL OMEGA-3 PO) Take 1 tablet by mouth daily.   oxyCODONE -acetaminophen  (PERCOCET/ROXICET) 5-325 MG tablet Take 1-2 tablets by mouth every 4 (four) hours as needed for moderate pain (pain score 4-6).   pantoprazole  (PROTONIX ) 40 MG tablet Take 40 mg by mouth daily.   traZODone  (DESYREL ) 50 MG tablet Take 25 mg by mouth at bedtime.   Cholecalciferol 125 MCG (5000 UT) TABS Take 5,000 Units by mouth daily. (Patient not taking: Reported on 12/30/2023)   No facility-administered encounter medications on file as of 12/30/2023.   ALLERGIES: Allergies  Allergen Reactions   Atorvastatin Other (See Comments)    Patient said he is not allergic     VACCINATION STATUS: Immunization History  Administered Date(s) Administered   Influenza,inj,Quad PF,6+ Mos 04/18/2016   Influenza-Unspecified 11/23/2019, 11/29/2020   Moderna Covid-19 Vaccine Bivalent Booster 66yrs & up 12/06/2020   Moderna Sars-Covid-2 Vaccination 05/03/2019, 06/02/2019, 12/14/2019   Pneumococcal Polysaccharide-23 04/18/2016, 04/27/2018   Td 04/27/2018   Td (Adult) 04/27/2018   Tdap 01/08/1996   Zoster Recombinant(Shingrix) 04/28/2020, 10/28/2020    HPI Bobby York is 68 y.o. male who presents today with a medical history as above. he is being seen in follow-up after he was seen in consultation for history of pituitary adenoma status postresection requested by Marvine Rush, MD. Patient is not an optimal historian, accompanied by his wife to clinic.    History is obtained from the family as well as chart review.  According to review of his records, he underwent transsphenoidal resection of pituitary adenoma on March 12, 2022 after presurgical workup.  His initial endocrine assessment records are not available to review.  He is not currently on any hormone replacement therapy.   This is his third visit with normal endocrine assessment  based on his labs-see labs below.    He denies headaches nor blurry vision or visual field deficits at this time. He has some baseline right-sided tremor and disequilibrium on and off. His other medical problems include diabetes type 2, high blood pressure and high cholesterol.  He also has CHF.  His on trazodone , meclizine, famotidine  and vitamin D. His surgical pathology review showed pituitary adenoma with gross description of 2 x 1.7 x 0.3 cm specimen. He does not have postsurgical workup for thyroid  function, adrenal assessment. He denies any new complaints, no polyuria/polydipsia.    Review of Systems  Constitutional: + Mildly fluctuating body weight, + fatigue, no subjective hyperthermia, no subjective  hypothermia   Objective:       12/30/2023    8:53 AM 07/22/2023    8:20 AM 05/20/2023    9:08 AM  Vitals with BMI  Height 5' 10 5' 11 5' 11  Weight 189 lbs 190 lbs 6 oz 194 lbs  BMI 27.12 26.57 27.07  Systolic 122 92 77  Diastolic 76 64 54  Pulse 95 98 91    BP 122/76   Pulse 95   Resp 18   Ht 5' 10 (1.778 m)   Wt 189 lb (85.7 kg)   SpO2 97%   BMI 27.12 kg/m   Wt Readings from  Last 3 Encounters:  12/30/23 189 lb (85.7 kg)  07/22/23 190 lb 6.4 oz (86.4 kg)  05/20/23 194 lb (88 kg)    Physical Exam  Constitutional:  Body mass index is 27.12 kg/m.,  not in acute distress, normal state of mind Eyes: PERRLA, EOMI, no exophthalmos ENT: moist mucous membranes, no gross thyromegaly, no gross cervical lymphadenopathy  Skin: moist, warm, no rashes Neurological: + tremor of right upper extremity at rest,  Deep tendon reflexes normal in bilateral lower extremities.  CMP ( most recent) CMP     Component Value Date/Time   NA 140 04/04/2023 1021   NA 142 12/23/2022 0807   K 4.1 04/04/2023 1021   CL 109 04/04/2023 1021   CO2 23 04/04/2023 1021   GLUCOSE 105 (H) 04/04/2023 1021   BUN 15 04/04/2023 1021   BUN 9 12/23/2022 0807   CREATININE 1.31 (H) 04/04/2023 1021   CREATININE 1.06 06/02/2015 0921   CALCIUM  9.2 04/04/2023 1021   PROT 6.9 04/04/2023 1021   PROT 6.8 12/23/2022 0807   ALBUMIN  3.7 04/04/2023 1021   ALBUMIN  4.1 12/23/2022 0807   AST 25 04/04/2023 1021   ALT 11 04/04/2023 1021   ALKPHOS 57 04/04/2023 1021   BILITOT 0.8 04/04/2023 1021   BILITOT 0.5 12/23/2022 0807   GFRNONAA 59 (L) 04/04/2023 1021   GFRAA >60 11/22/2019 1221     Lab Results  Component Value Date   TSH 1.320 12/22/2023   TSH 1.680 12/23/2022   TSH 1.470 06/22/2022   TSH 0.409 03/13/2022   TSH 1.298 12/11/2021   FREET4 1.10 12/22/2023   FREET4 0.93 12/23/2022   FREET4 0.96 06/22/2022   FREET4 0.72 12/11/2021    Recent Results (from the past 2160 hours)  Cortisol-am, blood      Status: None   Collection Time: 12/22/23  8:27 AM  Result Value Ref Range   Cortisol - AM 8.8 6.2 - 19.4 ug/dL  T4, Free     Status: None   Collection Time: 12/22/23  8:27 AM  Result Value Ref Range   Free T4 1.10 0.82 - 1.77 ng/dL  TSH     Status: None   Collection Time: 12/22/23  8:27 AM  Result Value Ref Range   TSH 1.320 0.450 - 4.500 uIU/mL  Prolactin     Status: None   Collection Time: 12/22/23  8:27 AM  Result Value Ref Range   Prolactin 17.4 3.6 - 25.2 ng/mL     Assessment & Plan:   1. H/O partial hypophysectomy (HCC)  - I have reviewed his new and available  records and clinically evaluated the patient. - Based on these reviews, he has history of transsphenoidal resection of pituitary adenoma  on March 12, 2022.  His current and previsit labs show absence of any endocrine deficit.  He will not need hormone replacement therapy at this time.    He will have repeat assessment of some of his labs in 12 months with office visit.  His endorgan function as well as his prolactin is normal, no need for pituitary/sella imaging at this time. - I did not initiate any new prescriptions today.  - he is advised to maintain close follow up with Marvine Rush, MD for primary care needs.  I spent  20  minutes in the care of the patient today including review of labs from Thyroid  Function, CMP, and other relevant labs ; imaging/biopsy records (current and previous including abstractions from other facilities); face-to-face time discussing  his  lab results and symptoms, medications doses, his options of short and long term treatment based on the latest standards of care / guidelines;   and documenting the encounter.  Bobby York  participated in the discussions, expressed understanding, and voiced agreement with the above plans.  All questions were answered to his satisfaction. he is encouraged to contact clinic should he have any questions or concerns prior to his return  visit.  Follow up plan: Return in about 1 year (around 12/29/2024) for F/U with Pre-visit Labs.  Ranny Earl, MD [facility] [address] [place], [place] [address] Phone: [phone]  Fax: [phone]     12/30/2023, 9:03 AM  This note was partially dictated with voice recognition software. Similar sounding words can be transcribed inadequately or may not  be corrected upon review.

## 2024-02-10 ENCOUNTER — Ambulatory Visit (HOSPITAL_COMMUNITY): Attending: Neurology | Admitting: Occupational Therapy

## 2024-02-10 ENCOUNTER — Encounter (HOSPITAL_COMMUNITY): Payer: Self-pay | Admitting: Occupational Therapy

## 2024-02-10 DIAGNOSIS — M25522 Pain in left elbow: Secondary | ICD-10-CM | POA: Diagnosis present

## 2024-02-10 DIAGNOSIS — R29898 Other symptoms and signs involving the musculoskeletal system: Secondary | ICD-10-CM | POA: Diagnosis present

## 2024-02-10 DIAGNOSIS — R278 Other lack of coordination: Secondary | ICD-10-CM | POA: Diagnosis present

## 2024-02-10 NOTE — Therapy (Unsigned)
 " OUTPATIENT OCCUPATIONAL THERAPY ORTHO EVALUATION  Patient Name: Bobby York MRN: [id number] DOB:27-Oct-1955, 68 y.o., male Today's Date: 02/10/2024  PCP: Marvine Rush, MD REFERRING PROVIDER: Lucillie Blades, MD  END OF SESSION:   Past Medical History:  Diagnosis Date   Bipolar 1 disorder [facility])    diagnosed 2004   Dyspnea    Hepatitis    Hep C   Substance abuse (HCC) 2021   Past Surgical History:  Procedure Laterality Date   BACK SURGERY     two-lumbar disc x2   CARPAL TUNNEL WITH CUBITAL TUNNEL Left 11/29/2023   CLAVICLE SURGERY     COLONOSCOPY WITH PROPOFOL  N/A 07/25/2014   MFM:dpwhoz rectal polyp otherwise normal . hyperplastic polyp. TCS in 07/2024   CRANIOTOMY N/A 03/12/2022   Procedure: ENDOSCOPIC ENDONASAL TRANSPHENOIDAL RESECTION OF PITUITARY MASS;  Surgeon: Debby Dorn MATSU, MD;  Location: [facility] OR;  Service: Neurosurgery;  Laterality: N/A;   ESOPHAGEAL DILATION N/A 07/25/2014   Procedure: ESOPHAGEAL DILATION;  Surgeon: Lamar CHRISTELLA Hollingshead, MD;  Location: [facility] ORS;  Service: Endoscopy;  Laterality: N/A;  Malony dilators # 56,   ESOPHAGOGASTRODUODENOSCOPY (EGD) WITH PROPOFOL  N/A 07/25/2014   MFM:fpoi erosive reflux/s/p dilator/HH. +hpylor    NECK SURGERY     TRANSPHENOIDAL APPROACH EXPOSURE N/A 03/12/2022   Procedure: TRANSPHENOIDAL ENDOSCOPIC RESCTION OF PITUITARY ADENOMA;  Surgeon: Llewellyn Gerard LABOR, DO;  Location: [facility] OR;  Service: ENT;  Laterality: N/A;   Patient Active Problem List   Diagnosis Date Noted   H/O partial hypophysectomy 06/21/2022   Pituitary mass 03/12/2022   Pituitary tumor 03/12/2022   DOE (dyspnea on exertion) 08/27/2021   Pneumothorax, traumatic 04/14/2016   H. pylori infection 08/22/2014   Reflux esophagitis    Dysphagia, pharyngoesophageal phase    Mucosal abnormality of stomach    History of colonic polyps    Chronic hepatitis C (HCC) 07/09/2014   Abnormal weight loss 07/09/2014   History of ETOH abuse 07/09/2014   Esophageal dysphagia  07/09/2014   Odynophagia 07/09/2014   Abnormal ECG 05/28/2014    ONSET DATE: 11/29/23  REFERRING DIAG: G56.20 (ICD-10-CM) - Cubital tunnel syndrome   THERAPY DIAG:  No diagnosis found.  Rationale for Evaluation and Treatment: Rehabilitation  SUBJECTIVE:   SUBJECTIVE STATEMENT: It really hurts Pt accompanied by: self  PERTINENT HISTORY: ***  PRECAUTIONS: None  WEIGHT BEARING RESTRICTIONS: No  PAIN:  Are you having pain? Yes: NPRS scale: 8/10 Pain location: L elbow Pain description: aching/tingling Aggravating factors: touch/pressure Relieving factors: aleve  FALLS: Has patient fallen in last 6 months? No  PLOF: Independent  PATIENT GOALS: To decrease pain  NEXT MD VISIT: 04/27/24  OBJECTIVE:  Note: Objective measures were completed at Evaluation unless otherwise noted.  HAND DOMINANCE: Right  ADLs: Overall ADLs: Pt unable to complete fine motor tasks, unable to manipulate buttons and zippers, as well as unable to don shoes and tie laces. Pt reports also being unable to fully reach up or out to grab light things, as he often drops them.   FUNCTIONAL OUTCOME MEASURES: Quick Dash: 86.36  UPPER EXTREMITY ROM:     Active ROM Left eval  Elbow flexion 135  Elbow extension 0  Wrist flexion 36  Wrist extension 37  Wrist ulnar deviation 22  Wrist radial deviation 8  Wrist pronation WFL  Wrist supination WFL  (Blank rows = not tested)  UPPER EXTREMITY MMT:     MMT Left eval  Elbow flexion 4+/5  Elbow extension 4/5  Wrist flexion 4/5  Wrist extension 4-/5  Wrist ulnar deviation 4-/5  Wrist radial deviation 4-/5  Wrist pronation 4-/5  Wrist supination 4-/5  (Blank rows = not tested)  HAND FUNCTION: Grip strength: Right: 24 lbs; Left: 39 lbs, Lateral pinch: Right: 3 lbs, Left: 4 lbs, and 3 point pinch: Right: 3 lbs, Left: 3 lbs  COORDINATION: 9 Hole Peg test: Right: 1'04 sec; Left: 42.46 sec  SENSATION: Mild decreased tactile sensation on the  palmar aspect of L hand  OBSERVATIONS: ***   TREATMENT DATE: ***                                                                                                                               PATIENT EDUCATION: Education details: *** Person educated: {Person educated:25204} Education method: {Education Method:25205} Education comprehension: {Education Comprehension:25206}  HOME EXERCISE PROGRAM: ***  GOALS: Goals reviewed with patient? {yes/no:20286}  SHORT TERM GOALS: Target date: ***  *** Baseline: Goal status: INITIAL  2.  *** Baseline:  Goal status: INITIAL  3.  *** Baseline:  Goal status: INITIAL  4.  *** Baseline:  Goal status: INITIAL  5.  *** Baseline:  Goal status: INITIAL  6.  *** Baseline:  Goal status: INITIAL  LONG TERM GOALS: Target date: ***  *** Baseline:  Goal status: INITIAL  2.  *** Baseline:  Goal status: INITIAL  3.  *** Baseline:  Goal status: INITIAL  4.  *** Baseline:  Goal status: INITIAL  5.  *** Baseline:  Goal status: INITIAL  6.  *** Baseline:  Goal status: INITIAL  ASSESSMENT:  CLINICAL IMPRESSION: Patient is a *** y.o. *** who was seen today for occupational therapy evaluation for ***.   PERFORMANCE DEFICITS: in functional skills including {OT physical skills:25468}, cognitive skills including {OT cognitive skills:25469}, and psychosocial skills including {OT psychosocial skills:25470}.   IMPAIRMENTS: are limiting patient from {OT performance deficits:25471}.   COMORBIDITIES: {Comorbidities:25485} that affects occupational performance. Patient will benefit from skilled OT to address above impairments and improve overall function.  MODIFICATION OR ASSISTANCE TO COMPLETE EVALUATION: {OT modification:25474}  OT OCCUPATIONAL PROFILE AND HISTORY: {OT PROFILE AND HISTORY:25484}  CLINICAL DECISION MAKING: {OT CDM:25475}  REHAB POTENTIAL: {rehabpotential:25112}  EVALUATION COMPLEXITY: {Evaluation  complexity:25115}      PLAN:  OT FREQUENCY: {rehab frequency:25116}  OT DURATION: {rehab duration:25117}  PLANNED INTERVENTIONS: {OT Interventions:25467}  RECOMMENDED OTHER SERVICES: ***  CONSULTED AND AGREED WITH PLAN OF CARE: {ENR:74513}  PLAN FOR NEXT SESSION: ***   Hardeep Reetz Jillyn Nightingale, OT 02/10/2024, 9:52 AM   "

## 2024-02-14 ENCOUNTER — Encounter (HOSPITAL_COMMUNITY): Payer: Self-pay | Admitting: Occupational Therapy

## 2024-02-14 ENCOUNTER — Ambulatory Visit (HOSPITAL_COMMUNITY): Admitting: Occupational Therapy

## 2024-02-14 DIAGNOSIS — M25522 Pain in left elbow: Secondary | ICD-10-CM | POA: Diagnosis not present

## 2024-02-14 DIAGNOSIS — R29898 Other symptoms and signs involving the musculoskeletal system: Secondary | ICD-10-CM

## 2024-02-14 NOTE — Therapy (Signed)
 " OUTPATIENT OCCUPATIONAL THERAPY ORTHO TREATMENT NOTE  Patient Name: Bobby York MRN: [id number] DOB:23-Jul-1955, 68 y.o., male Today's Date: 02/14/2024  PCP: Marvine Rush, MD REFERRING PROVIDER: Lucillie Blades, MD  END OF SESSION:  OT End of Session - 02/14/24 1436     Visit Number 2    Number of Visits 12    Date for Recertification  03/30/24    Authorization Type 1)[employer] medicare 2) [place]    Authorization Time Period Requesting 12 visits    Authorization - Visit Number 2    Authorization - Number of Visits 12    OT Start Time 1349    OT Stop Time 1428    OT Time Calculation (min) 39 min    Activity Tolerance Patient tolerated treatment well    Behavior During Therapy [facility] for tasks assessed/performed          Past Medical History:  Diagnosis Date   Bipolar 1 disorder (HCC)    diagnosed 2004   Dyspnea    Hepatitis    Hep C   Substance abuse (HCC) 2021   Past Surgical History:  Procedure Laterality Date   BACK SURGERY     two-lumbar disc x2   CARPAL TUNNEL WITH CUBITAL TUNNEL Left 11/29/2023   CLAVICLE SURGERY     COLONOSCOPY WITH PROPOFOL  N/A 07/25/2014   MFM:dpwhoz rectal polyp otherwise normal . hyperplastic polyp. TCS in 07/2024   CRANIOTOMY N/A 03/12/2022   Procedure: ENDOSCOPIC ENDONASAL TRANSPHENOIDAL RESECTION OF PITUITARY MASS;  Surgeon: Debby Dorn MATSU, MD;  Location: [facility] OR;  Service: Neurosurgery;  Laterality: N/A;   ESOPHAGEAL DILATION N/A 07/25/2014   Procedure: ESOPHAGEAL DILATION;  Surgeon: Lamar CHRISTELLA Hollingshead, MD;  Location: [facility];  Service: Endoscopy;  Laterality: N/A;  Malony dilators # 56,   ESOPHAGOGASTRODUODENOSCOPY (EGD) WITH PROPOFOL  N/A 07/25/2014   MFM:fpoi erosive reflux/s/p dilator/HH. +hpylor    NECK SURGERY     TRANSPHENOIDAL APPROACH EXPOSURE N/A 03/12/2022   Procedure: TRANSPHENOIDAL ENDOSCOPIC RESCTION OF PITUITARY ADENOMA;  Surgeon: Llewellyn Gerard LABOR, DO;  Location: [facility] OR;  Service: ENT;  Laterality: N/A;   Patient Active  Problem List   Diagnosis Date Noted   H/O partial hypophysectomy 06/21/2022   Pituitary mass 03/12/2022   Pituitary tumor 03/12/2022   DOE (dyspnea on exertion) 08/27/2021   Pneumothorax, traumatic 04/14/2016   H. pylori infection 08/22/2014   Reflux esophagitis    Dysphagia, pharyngoesophageal phase    Mucosal abnormality of stomach    History of colonic polyps    Chronic hepatitis C (HCC) 07/09/2014   Abnormal weight loss 07/09/2014   History of ETOH abuse 07/09/2014   Esophageal dysphagia 07/09/2014   Odynophagia 07/09/2014   Abnormal ECG 05/28/2014    ONSET DATE: 11/29/23  REFERRING DIAG: G56.20 (ICD-10-CM) - Cubital tunnel syndrome   THERAPY DIAG:  Pain in left elbow  Other symptoms and signs involving the musculoskeletal system  Rationale for Evaluation and Treatment: Rehabilitation  SUBJECTIVE:   SUBJECTIVE STATEMENT: It really hurts Pt accompanied by: self  PERTINENT HISTORY: Pt s/p L cubital tunnel release. PMH significant for Parkinson's.   PRECAUTIONS: None  WEIGHT BEARING RESTRICTIONS: No  PAIN:  Are you having pain? Yes: NPRS scale: 8/10 Pain location: L elbow Pain description: aching/tingling Aggravating factors: touch/pressure Relieving factors: aleve  FALLS: Has patient fallen in last 6 months? No  PLOF: Independent  PATIENT GOALS: To decrease pain  NEXT MD VISIT: 04/27/24  OBJECTIVE:  Note: Objective measures were completed at Evaluation unless otherwise noted.  HAND DOMINANCE: Right  ADLs: Overall ADLs: Pt unable to complete fine motor tasks, unable to manipulate buttons and zippers, as well as unable to don shoes and tie laces. Pt reports also being unable to fully reach up or out to grab light things, as he often drops them.   FUNCTIONAL OUTCOME MEASURES: Quick Dash: 86.36  UPPER EXTREMITY ROM:     Active ROM Left eval  Elbow flexion 135  Elbow extension 0  Wrist flexion 36  Wrist extension 37  Wrist ulnar deviation 22   Wrist radial deviation 8  Wrist pronation WFL  Wrist supination WFL  (Blank rows = not tested)  UPPER EXTREMITY MMT:     MMT Left eval  Elbow flexion 4+/5  Elbow extension 4/5  Wrist flexion 4/5  Wrist extension 4-/5  Wrist ulnar deviation 4-/5  Wrist radial deviation 4-/5  Wrist pronation 4-/5  Wrist supination 4-/5  (Blank rows = not tested)  HAND FUNCTION: Grip strength: Right: 24 lbs; Left: 39 lbs, Lateral pinch: Right: 3 lbs, Left: 4 lbs, and 3 point pinch: Right: 3 lbs, Left: 3 lbs  COORDINATION: 9 Hole Peg test: Right: 1'04 sec; Left: 42.46 sec  SENSATION: Mild decreased tactile sensation on the palmar aspect of L hand  OBSERVATIONS: moderate fascial restrictions noted along the lateral aspect of the elbow, forearm, and biceps.    TREATMENT DATE:   02/14/24 -Manual Therapy: myofascial release and trigger point applied to medial and lateral aspect of elbow, forearm, and biceps in order to reduce pain and fascial restrictions as well as improve ROM.  -Wrist Rom: flexion, extension, ulnar/radial deviation, supination/pronation, x10 -Elbow ROM: neutral flexion/extension, supinated flexion/extension, pronated flexion/extension, x12 -Gripper: 25# medium and large beads, 23# medium and large beads     -wrist stretches: extension and flexion, 5x20                                                 02/10/24 -Wrist Rom: flexion, extension, ulnar/radial deviation, supination/pronation, x10 -Elbow ROM: flexion/extension, slow and controlled with shoulder at 0* flexion.                                                                                                                                PATIENT EDUCATION: Education details: wrist and elbow ROM Person educated: Patient Education method: Explanation, Demonstration, and Handouts Education comprehension: verbalized understanding and returned demonstration  HOME EXERCISE PROGRAM: 12/19: wrist and elbow  ROM  GOALS: Goals reviewed with patient? Yes  SHORT TERM GOALS: Target date: 03/23/24  Pt will be provided and educated on LUE HEP for elbow and wrist mobility for ADL completion.   Goal status: INITIAL  2.  Pt will decrease LUE pain to 3/10 or less in order to sleep for 3+ consecutive hours without waking due to  pain.   Goal status: INITIAL  3.  Pt will decrease LUE fascial restrictions to minimal amounts in order to reach out and overhead independently with dressing and bathing.   Goal status: INITIAL  4.  PT will increase L wrist ROM by 15* in order to manipulate bathing items.   Goal status: INITIAL  5.  Pt will increase LUE strength to 5/5 in order to lift and carry items during cooking tasks.   Goal status: INITIAL  6.  Pt will increase LUE grip strength by 10# and pinch strength by 2# in order to grasp and manipulate small items during dressing.   Goal status: INITIAL  ASSESSMENT:  CLINICAL IMPRESSION: This session pt continues to report increased pain and sensitivity along his lateral elbow. He struggles to flex his elbow when in neutral or supinated, however was able to tolerate pronated well. All wrist mobility continues to increase his discomfort. Manual therapy provided due to moderate to severe fascial restrictions. OT provided for positioning and technique throughout session.   PERFORMANCE DEFICITS: in functional skills including ADLs, IADLs, coordination, ROM, strength, pain, fascial restrictions, muscle spasms, Fine motor control, Gross motor control, body mechanics, and UE functional use.   PLAN:  OT FREQUENCY: 2x/week  OT DURATION: 6 weeks  PLANNED INTERVENTIONS: 97168 OT Re-evaluation, 97535 self care/ADL training, 02889 therapeutic exercise, 97530 therapeutic activity, 97112 neuromuscular re-education, 97140 manual therapy, 97035 ultrasound, 97018 paraffin, 02989 moist heat, 97032 electrical stimulation (manual), passive range of motion, functional  mobility training, energy conservation, coping strategies training, patient/family education, and DME and/or AE instructions  RECOMMENDED OTHER SERVICES: N/A  CONSULTED AND AGREED WITH PLAN OF CARE: Patient  PLAN FOR NEXT SESSION: Manual Therapy, Stretching, ROM, gripping   Valentin Nightingale, OTR/L [facility] [phone] Valentin Jillyn Nightingale, OT 02/14/2024, 2:38 PM   "

## 2024-02-18 ENCOUNTER — Other Ambulatory Visit: Payer: Self-pay | Admitting: Neurology

## 2024-02-18 ENCOUNTER — Other Ambulatory Visit: Payer: Self-pay

## 2024-02-18 ENCOUNTER — Emergency Department (HOSPITAL_COMMUNITY)
Admission: EM | Admit: 2024-02-18 | Discharge: 2024-02-18 | Disposition: A | Discharge status: Home / Self Care | Attending: Emergency Medicine | Admitting: Emergency Medicine

## 2024-02-18 ENCOUNTER — Encounter (HOSPITAL_COMMUNITY): Payer: Self-pay | Admitting: Emergency Medicine

## 2024-02-18 ENCOUNTER — Emergency Department (HOSPITAL_COMMUNITY)

## 2024-02-18 DIAGNOSIS — R0602 Shortness of breath: Secondary | ICD-10-CM | POA: Insufficient documentation

## 2024-02-18 DIAGNOSIS — E876 Hypokalemia: Secondary | ICD-10-CM | POA: Insufficient documentation

## 2024-02-18 DIAGNOSIS — G20A1 Parkinson's disease without dyskinesia, without mention of fluctuations: Secondary | ICD-10-CM | POA: Diagnosis not present

## 2024-02-18 DIAGNOSIS — R0789 Other chest pain: Secondary | ICD-10-CM | POA: Diagnosis present

## 2024-02-18 LAB — D-DIMER, QUANTITATIVE: D-Dimer, Quant: 0.49 ug{FEU}/mL (ref 0.00–0.50)

## 2024-02-18 LAB — TROPONIN T, HIGH SENSITIVITY: Troponin T High Sensitivity: 16 ng/L (ref 0–19)

## 2024-02-18 LAB — BASIC METABOLIC PANEL WITH GFR
Anion gap: 14 (ref 5–15)
BUN: 12 mg/dL (ref 8–23)
CO2: 25 mmol/L (ref 22–32)
Calcium: 8.6 mg/dL — ABNORMAL LOW (ref 8.9–10.3)
Chloride: 102 mmol/L (ref 98–111)
Creatinine, Ser: 0.96 mg/dL (ref 0.61–1.24)
GFR, Estimated: >60 mL/min
Glucose, Bld: 111 mg/dL — ABNORMAL HIGH (ref 70–99)
Potassium: 2.8 mmol/L — ABNORMAL LOW (ref 3.5–5.1)
Sodium: 141 mmol/L (ref 135–145)

## 2024-02-18 LAB — CBC
HCT: 40.9 % (ref 39.0–52.0)
Hemoglobin: 13.8 g/dL (ref 13.0–17.0)
MCH: 31.2 pg (ref 26.0–34.0)
MCHC: 33.7 g/dL (ref 30.0–36.0)
MCV: 92.5 fL (ref 80.0–100.0)
Platelets: 184 K/uL (ref 150–400)
RBC: 4.42 MIL/uL (ref 4.22–5.81)
RDW: 11.9 % (ref 11.5–15.5)
WBC: 4.3 K/uL (ref 4.0–10.5)
nRBC: 0 % (ref 0.0–0.2)

## 2024-02-18 MED ORDER — POTASSIUM CHLORIDE CRYS ER 20 MEQ PO TBCR
60.0000 meq | EXTENDED_RELEASE_TABLET | Freq: Once | ORAL | Status: AC
  Administered 2024-02-18: 60 meq via ORAL
  Filled 2024-02-18: qty 3

## 2024-02-18 MED ORDER — OXYCODONE HCL 5 MG PO TABS: 5.0000 mg | ORAL_TABLET | Freq: Four times a day (QID) | ORAL | 0 refills | Status: AC | PRN

## 2024-02-18 MED ORDER — OXYCODONE-ACETAMINOPHEN 5-325 MG PO TABS
1.0000 | ORAL_TABLET | Freq: Once | ORAL | Status: AC
  Administered 2024-02-18: 1 via ORAL
  Filled 2024-02-18: qty 1

## 2024-02-18 MED ORDER — LIDOCAINE 5 % EX PTCH: 1.0000 | MEDICATED_PATCH | CUTANEOUS | 0 refills | Status: AC

## 2024-02-18 MED ORDER — LIDOCAINE 5 % EX PTCH
1.0000 | MEDICATED_PATCH | Freq: Once | CUTANEOUS | Status: DC
  Administered 2024-02-18: 1 via TRANSDERMAL
  Filled 2024-02-18: qty 1

## 2024-02-18 NOTE — Discharge Instructions (Addendum)
 We evaluated you for your chest pain.  Your testing was reassuring.  We did not find any dangerous cause of your symptoms today such as a heart attack or blood clot.  Please take 1000 milligrams of Tylenol  every 6 hours as needed for pain. We have given you a small amount of oxycodone  which you can take for pain not relieved by Tylenol .  Do not drink alcohol, drive, or operate machinery when taking this medicine.  We have also given you a prescription for a lidocaine  patch that you can attach to your chest.  We also saw that your potassium level was low.  We gave you additional potassium in the emergency department.  Please follow-up with your primary doctor next week to have this rechecked.  We do not think this is related to your pain.  If you have any new or worsening symptoms, please return to the emergency department.

## 2024-02-18 NOTE — ED Triage Notes (Addendum)
 Pov c/o of left sided rib pain that started 2 hours ago. States its worse upon inhalation. Denies CP at this time. Pt endorses SOB but that's pt's baseline.

## 2024-02-18 NOTE — ED Provider Notes (Signed)
 " [place] EMERGENCY DEPARTMENT AT [facility] Provider Note  CSN: [id number] Arrival date & time: 02/18/24 1432  Chief Complaint(s) Chest Pain  HPI Bobby York is a 68 y.o. male with history of bipolar disorder, Parkinson's disease presenting with left-sided chest pain.  Patient reports pain in the left lower chest.  Reports it is pleuritic, worse with movement or twisting.  No history of trauma or falls.  Began suddenly.  Denies any recent travel or surgery.  Denies any leg swelling or pain.  No new shortness of breath reports that he chronically feels short of breath but this is no different than his baseline..  No fevers or chills, cough.   Past Medical History Past Medical History:  Diagnosis Date   Bipolar 1 disorder (HCC)    diagnosed 2004   Dyspnea    Hepatitis    Hep C   Substance abuse (HCC) 2021   Patient Active Problem List   Diagnosis Date Noted   H/O partial hypophysectomy 06/21/2022   Pituitary mass 03/12/2022   Pituitary tumor 03/12/2022   DOE (dyspnea on exertion) 08/27/2021   Pneumothorax, traumatic 04/14/2016   H. pylori infection 08/22/2014   Reflux esophagitis    Dysphagia, pharyngoesophageal phase    Mucosal abnormality of stomach    History of colonic polyps    Chronic hepatitis C (HCC) 07/09/2014   Abnormal weight loss 07/09/2014   History of ETOH abuse 07/09/2014   Esophageal dysphagia 07/09/2014   Odynophagia 07/09/2014   Abnormal ECG 05/28/2014   Home Medication(s) Prior to Admission medications  Medication Sig Start Date End Date Taking? Authorizing Provider  lidocaine  (LIDODERM ) 5 % Place 1 patch onto the skin daily. Remove & Discard patch within 12 hours or as directed by MD 02/18/24  Yes Francesca Elsie CROME, MD  oxyCODONE  (ROXICODONE ) 5 MG immediate release tablet Take 1 tablet (5 mg total) by mouth every 6 (six) hours as needed for severe pain (pain score 7-10). 02/18/24  Yes Francesca Elsie CROME, MD  carbidopa -levodopa   (SINEMET  IR) 25-100 MG tablet Take 1 tablet by mouth 4 (four) times daily. 09/23/23   Athar, Saima, MD  Cholecalciferol 125 MCG (5000 UT) TABS Take 5,000 Units by mouth daily. Patient not taking: Reported on 12/30/2023    [provider]  citalopram  (CELEXA ) 20 MG tablet Take 1 tablet (20 mg total) by mouth daily. 07/22/23   Camara, Amadou, MD  fludrocortisone  (FLORINEF ) 0.1 MG tablet Take 1 tablet (100 mcg total) by mouth 2 (two) times daily. 07/22/23   Camara, Amadou, MD  Omega-3 Fatty Acids (FISH OIL OMEGA-3 PO) Take 1 tablet by mouth daily.    [provider]  pantoprazole  (PROTONIX ) 40 MG tablet Take 40 mg by mouth daily.    [provider]  traZODone  (DESYREL ) 50 MG tablet Take 25 mg by mouth at bedtime. 04/30/20   [provider]  Past Surgical History Past Surgical History:  Procedure Laterality Date   BACK SURGERY     two-lumbar disc x2   CARPAL TUNNEL WITH CUBITAL TUNNEL Left 11/29/2023   CLAVICLE SURGERY     COLONOSCOPY WITH PROPOFOL  N/A 07/25/2014   MFM:dpwhoz rectal polyp otherwise normal . hyperplastic polyp. TCS in 07/2024   CRANIOTOMY N/A 03/12/2022   Procedure: ENDOSCOPIC ENDONASAL TRANSPHENOIDAL RESECTION OF PITUITARY MASS;  Surgeon: Debby Dorn MATSU, MD;  Location: [facility] OR;  Service: Neurosurgery;  Laterality: N/A;   ESOPHAGEAL DILATION N/A 07/25/2014   Procedure: ESOPHAGEAL DILATION;  Surgeon: Lamar CHRISTELLA Hollingshead, MD;  Location: [facility] ORS;  Service: Endoscopy;  Laterality: N/A;  Malony dilators # 56,   ESOPHAGOGASTRODUODENOSCOPY (EGD) WITH PROPOFOL  N/A 07/25/2014   MFM:fpoi erosive reflux/s/p dilator/HH. +hpylor    NECK SURGERY     TRANSPHENOIDAL APPROACH EXPOSURE N/A 03/12/2022   Procedure: TRANSPHENOIDAL ENDOSCOPIC RESCTION OF PITUITARY ADENOMA;  Surgeon: Llewellyn Gerard LABOR, DO;  Location: [facility] OR;  Service: ENT;   Laterality: N/A;   Family History Family History  Problem Relation Age of Onset   Heart disease Mother    Diabetes Mother    Prostate cancer Father    Liver disease Neg Hx    Colon cancer Neg Hx     Social History Social History[1] Allergies Atorvastatin  Review of Systems Review of Systems  All other systems reviewed and are negative.   Physical Exam Vital Signs  I have reviewed the triage vital signs BP 108/63   Pulse 89   Temp 97.6 F (36.4 C) (Oral)   Resp 18   Ht 5' 10 (1.778 m)   Wt 86.2 kg   SpO2 96%   BMI 27.26 kg/m  Physical Exam Vitals and nursing note reviewed.  Constitutional:      General: He is not in acute distress.    Appearance: Normal appearance.  HENT:     Mouth/Throat:     Mouth: Mucous membranes are moist.  Eyes:     Conjunctiva/sclera: Conjunctivae normal.  Cardiovascular:     Rate and Rhythm: Normal rate and regular rhythm.  Pulmonary:     Effort: Pulmonary effort is normal. No respiratory distress.     Breath sounds: Normal breath sounds.  Chest:     Chest wall: Tenderness (left lower chest, reproduces pain) present. No mass, deformity or crepitus.  Abdominal:     General: Abdomen is flat.     Palpations: Abdomen is soft.     Tenderness: There is no abdominal tenderness.  Musculoskeletal:     Right lower leg: No edema.     Left lower leg: No edema.  Skin:    General: Skin is warm and dry.     Capillary Refill: Capillary refill takes less than 2 seconds.  Neurological:     Mental Status: He is alert and oriented to person, place, and time. Mental status is at baseline.  Psychiatric:        Mood and Affect: Mood normal.        Behavior: Behavior normal.     ED Results and Treatments Labs (all labs ordered are listed, but only abnormal results are displayed) Labs Reviewed  BASIC METABOLIC PANEL WITH GFR - Abnormal; Notable for the following components:      Result Value   Potassium 2.8 (*)    Glucose, Bld 111 (*)     Calcium  8.6 (*)    All other components within normal limits  CBC  D-DIMER, QUANTITATIVE  TROPONIN  T, HIGH SENSITIVITY                                                                                                                          Radiology DG Chest 2 View Result Date: 02/18/2024 CLINICAL DATA:  Chest pain.  Left-sided pain. EXAM: CHEST - 2 VIEW COMPARISON:  02/19/2023 FINDINGS: The cardiomediastinal contours are normal. Mild biapical pleuroparenchymal scarring. Pulmonary vasculature is normal. No consolidation, pleural effusion, or pneumothorax. No acute osseous abnormalities are seen. Remote right rib fractures. IMPRESSION: No active cardiopulmonary disease. Electronically Signed   By: Andrea Gasman M.D.   On: 02/18/2024 15:53    Pertinent labs & imaging results that were available during my care of the patient were reviewed by me and considered in my medical decision making (see MDM for details).  Medications Ordered in ED Medications  lidocaine  (LIDODERM ) 5 % 1 patch (has no administration in time range)  oxyCODONE -acetaminophen  (PERCOCET/ROXICET) 5-325 MG per tablet 1 tablet (1 tablet Oral Given 02/18/24 1635)  oxyCODONE -acetaminophen  (PERCOCET/ROXICET) 5-325 MG per tablet 1 tablet (1 tablet Oral Given 02/18/24 1804)  potassium chloride  SA (KLOR-CON  M) CR tablet 60 mEq (60 mEq Oral Given 02/18/24 1804)                                                                                                                                     Procedures Procedures  (including critical care time)  Medical Decision Making / ED Course   MDM:  68 year old presenting to the emergency department chest pain.  Patient overall well-appearing, physical examination with reproducible tenderness on palpation, lungs clear..  Seems very consistent with musculoskeletal pain given reproducibility, worse with movement.  Differential also includes process such as ACS, will check single  troponin as symptoms have been present for more than 2 hours.  EKG without acute change other than mild difference in QT.  Pain is pleuritic, could represent pulmonary embolism although seems unlikely, will check D-dimer.  Will check chest x-ray to evaluate for underlying airspace disease in the lungs are clear on exam.  Will give pain medication and reassess.  Clinical Course as of 02/18/24 1821  Sat Feb 18, 2024  1821 Workup overall reassuring.  D-dimer is negative.  Troponin is negative within 2 hours of symptoms.  Chest x-ray is clear.  Patient is feeling better.  Feel patient is stable for discharge. Will discharge patient  to home. All questions answered. Patient comfortable with plan of discharge. Return precautions discussed with patient and specified on the after visit summary.  [WS]    Clinical Course User Index [WS] Francesca, Elsie CROME, MD     Additional history obtained: -Additional history obtained from family -External records from outside source obtained and reviewed including: Chart review including previous notes, labs, imaging, consultation notes including prior notes    Lab Tests: -I ordered, reviewed, and interpreted labs.   The pertinent results include:   Labs Reviewed  BASIC METABOLIC PANEL WITH GFR - Abnormal; Notable for the following components:      Result Value   Potassium 2.8 (*)    Glucose, Bld 111 (*)    Calcium  8.6 (*)    All other components within normal limits  CBC  D-DIMER, QUANTITATIVE  TROPONIN T, HIGH SENSITIVITY    Notable for mild hypokalemia, repleted   EKG   EKG Interpretation Date/Time:  Saturday February 18 2024 14:48:32 EST Ventricular Rate:  93 PR Interval:  140 QRS Duration:  76 QT Interval:  438 QTC Calculation: 544 R Axis:   74  Text Interpretation: Normal sinus rhythm Nonspecific ST abnormality Prolonged QT Abnormal ECG When compared with ECG of 04-Apr-2023 10:19, QT has lengthened Confirmed by Francesca Elsie ([id number]) on  02/18/2024 3:34:27 PM         Imaging Studies ordered: I ordered imaging studies including CXR On my interpretation imaging demonstrates no acute process I independently visualized and interpreted imaging. I agree with the radiologist interpretation   Medicines ordered and prescription drug management: Meds ordered this encounter  Medications   oxyCODONE -acetaminophen  (PERCOCET/ROXICET) 5-325 MG per tablet 1 tablet    Refill:  0   oxyCODONE -acetaminophen  (PERCOCET/ROXICET) 5-325 MG per tablet 1 tablet    Refill:  0   potassium chloride  SA (KLOR-CON  M) CR tablet 60 mEq   lidocaine  (LIDODERM ) 5 % 1 patch   oxyCODONE  (ROXICODONE ) 5 MG immediate release tablet    Sig: Take 1 tablet (5 mg total) by mouth every 6 (six) hours as needed for severe pain (pain score 7-10).    Dispense:  12 tablet    Refill:  0   lidocaine  (LIDODERM ) 5 %    Sig: Place 1 patch onto the skin daily. Remove & Discard patch within 12 hours or as directed by MD    Dispense:  30 patch    Refill:  0    -I have reviewed the patients home medicines and have made adjustments as needed    Reevaluation: After the interventions noted above, I reevaluated the patient and found that their symptoms have improved  Co morbidities that complicate the patient evaluation  Past Medical History:  Diagnosis Date   Bipolar 1 disorder (HCC)    diagnosed 2004   Dyspnea    Hepatitis    Hep C   Substance abuse (HCC) 2021      Dispostion: Disposition decision including need for hospitalization was considered, and patient discharged from emergency department.    Final Clinical Impression(s) / ED Diagnoses Final diagnoses:  Chest wall pain  Hypokalemia     This chart was dictated using voice recognition software.  Despite best efforts to proofread,  errors can occur which can change the documentation meaning.     [1]  Social History Tobacco Use   Smoking status: Former    Current packs/day: 0.00     Average packs/day: 0.4 packs/day for 50.8 years (20.3 ttl pk-yrs)  Types: Cigarettes    Start date: 05/28/1970    Quit date: 03/2021    Years since quitting: 2.9   Smokeless tobacco: Never   Tobacco comments:    Pt states he stopped smoking about 4-5 months ago // mr CMA 08/27/2021   Vaping Use   Vaping status: Never Used  Substance Use Topics   Alcohol use: Not Currently    Comment: AS of today, last use in 2021   Drug use: Not Currently    Types: Marijuana    Comment: AS of today, last use in 2021     Francesca Elsie CROME, MD 02/18/24 1821  "

## 2024-02-20 ENCOUNTER — Encounter (HOSPITAL_COMMUNITY): Payer: Self-pay | Admitting: Occupational Therapy

## 2024-02-20 ENCOUNTER — Ambulatory Visit (HOSPITAL_COMMUNITY): Admitting: Occupational Therapy

## 2024-02-20 DIAGNOSIS — R29898 Other symptoms and signs involving the musculoskeletal system: Secondary | ICD-10-CM

## 2024-02-20 DIAGNOSIS — R278 Other lack of coordination: Secondary | ICD-10-CM

## 2024-02-20 DIAGNOSIS — M25522 Pain in left elbow: Secondary | ICD-10-CM

## 2024-02-20 NOTE — Therapy (Signed)
 " OUTPATIENT OCCUPATIONAL THERAPY ORTHO TREATMENT NOTE  Patient Name: Bobby York MRN: [id number] DOB:March 05, 1955, 68 y.o., male Today's Date: 02/20/2024  PCP: Marvine Rush, MD REFERRING PROVIDER: Lucillie Blades, MD  END OF SESSION:  OT End of Session - 02/20/24 1611     Visit Number 3    Number of Visits 12    Date for Recertification  03/30/24    Authorization Type 1)[employer] medicare 2) [place]    Authorization Time Period Requesting 12 visits    Authorization - Visit Number 3    Authorization - Number of Visits 12    Progress Note Due on Visit 10    OT Start Time 1450    OT Stop Time 1535    OT Time Calculation (min) 45 min    Activity Tolerance Patient tolerated treatment well    Behavior During Therapy [facility] for tasks assessed/performed           Past Medical History:  Diagnosis Date   Bipolar 1 disorder (HCC)    diagnosed 2004   Dyspnea    Hepatitis    Hep C   Substance abuse (HCC) 2021   Past Surgical History:  Procedure Laterality Date   BACK SURGERY     two-lumbar disc x2   CARPAL TUNNEL WITH CUBITAL TUNNEL Left 11/29/2023   CLAVICLE SURGERY     COLONOSCOPY WITH PROPOFOL  N/A 07/25/2014   MFM:dpwhoz rectal polyp otherwise normal . hyperplastic polyp. TCS in 07/2024   CRANIOTOMY N/A 03/12/2022   Procedure: ENDOSCOPIC ENDONASAL TRANSPHENOIDAL RESECTION OF PITUITARY MASS;  Surgeon: Debby Dorn MATSU, MD;  Location: [facility] OR;  Service: Neurosurgery;  Laterality: N/A;   ESOPHAGEAL DILATION N/A 07/25/2014   Procedure: ESOPHAGEAL DILATION;  Surgeon: Lamar CHRISTELLA Hollingshead, MD;  Location: [facility] ORS;  Service: Endoscopy;  Laterality: N/A;  Malony dilators # 56,   ESOPHAGOGASTRODUODENOSCOPY (EGD) WITH PROPOFOL  N/A 07/25/2014   MFM:fpoi erosive reflux/s/p dilator/HH. +hpylor    NECK SURGERY     TRANSPHENOIDAL APPROACH EXPOSURE N/A 03/12/2022   Procedure: TRANSPHENOIDAL ENDOSCOPIC RESCTION OF PITUITARY ADENOMA;  Surgeon: Llewellyn Gerard LABOR, DO;  Location: [facility] OR;  Service: ENT;   Laterality: N/A;   Patient Active Problem List   Diagnosis Date Noted   H/O partial hypophysectomy 06/21/2022   Pituitary mass 03/12/2022   Pituitary tumor 03/12/2022   DOE (dyspnea on exertion) 08/27/2021   Pneumothorax, traumatic 04/14/2016   H. pylori infection 08/22/2014   Reflux esophagitis    Dysphagia, pharyngoesophageal phase    Mucosal abnormality of stomach    History of colonic polyps    Chronic hepatitis C (HCC) 07/09/2014   Abnormal weight loss 07/09/2014   History of ETOH abuse 07/09/2014   Esophageal dysphagia 07/09/2014   Odynophagia 07/09/2014   Abnormal ECG 05/28/2014    ONSET DATE: 11/29/23  REFERRING DIAG: G56.20 (ICD-10-CM) - Cubital tunnel syndrome   THERAPY DIAG:  Pain in left elbow  Other symptoms and signs involving the musculoskeletal system  Other lack of coordination  Rationale for Evaluation and Treatment: Rehabilitation  SUBJECTIVE:   SUBJECTIVE STATEMENT: It aint as bad as it was Pt accompanied by: self  PERTINENT HISTORY: Pt s/p L cubital tunnel release. PMH significant for Parkinson's.   PRECAUTIONS: None  WEIGHT BEARING RESTRICTIONS: No  PAIN:  Are you having pain? Yes: NPRS scale: 6/10 Pain location: L elbow Pain description: aching/tingling Aggravating factors: touch/pressure Relieving factors: aleve  FALLS: Has patient fallen in last 6 months? No  PLOF: Independent  PATIENT GOALS: To decrease  pain  NEXT MD VISIT: 04/27/24  OBJECTIVE:  Note: Objective measures were completed at Evaluation unless otherwise noted.  HAND DOMINANCE: Right  ADLs: Overall ADLs: Pt unable to complete fine motor tasks, unable to manipulate buttons and zippers, as well as unable to don shoes and tie laces. Pt reports also being unable to fully reach up or out to grab light things, as he often drops them.   FUNCTIONAL OUTCOME MEASURES: Quick Dash: 86.36  UPPER EXTREMITY ROM:     Active ROM Left eval  Elbow flexion 135  Elbow  extension 0  Wrist flexion 36  Wrist extension 37  Wrist ulnar deviation 22  Wrist radial deviation 8  Wrist pronation WFL  Wrist supination WFL  (Blank rows = not tested)  UPPER EXTREMITY MMT:     MMT Left eval  Elbow flexion 4+/5  Elbow extension 4/5  Wrist flexion 4/5  Wrist extension 4-/5  Wrist ulnar deviation 4-/5  Wrist radial deviation 4-/5  Wrist pronation 4-/5  Wrist supination 4-/5  (Blank rows = not tested)  HAND FUNCTION: Grip strength: Right: 24 lbs; Left: 39 lbs, Lateral pinch: Right: 3 lbs, Left: 4 lbs, and 3 point pinch: Right: 3 lbs, Left: 3 lbs  COORDINATION: 9 Hole Peg test: Right: 1'04 sec; Left: 42.46 sec  SENSATION: Mild decreased tactile sensation on the palmar aspect of L hand  OBSERVATIONS: moderate fascial restrictions noted along the lateral aspect of the elbow, forearm, and biceps.    TREATMENT DATE:   02/20/24 -Manual Therapy: myofascial release and trigger point applied to medial and lateral aspect of elbow, forearm, and biceps in order to reduce pain and fascial restrictions as well as improve ROM.  -Elbow ROM: neutral flexion/extension, supinated flexion/extension, pronated flexion/extension, x12 -Wrist Rom: flexion, extension, ulnar/radial deviation, supination/pronation, x10 -Wrist strength: flexion,extension, radial/ulnar deviation, supination/pronation 10x 2 pound weight -Bicep strength: curls, hammer curls 10x each 2 pound weight                                     -Theraputty: flatten, PVC pipe to cut cookies to improve grip strength  02/14/24 -Manual Therapy: myofascial release and trigger point applied to medial and lateral aspect of elbow, forearm, and biceps in order to reduce pain and fascial restrictions as well as improve ROM.  -Wrist Rom: flexion, extension, ulnar/radial deviation, supination/pronation, x10 -Elbow ROM: neutral flexion/extension, supinated flexion/extension, pronated flexion/extension, x12 -Gripper: 25#  medium and large beads, 23# medium and large beads     -wrist stretches: extension and flexion, 5x20       02/10/24 -Wrist Rom: flexion, extension, ulnar/radial deviation, supination/pronation, x10 -Elbow ROM: flexion/extension, slow and controlled with shoulder at 0* flexion.  PATIENT EDUCATION: Education details: wrist and elbow ROM Person educated: Patient Education method: Explanation, Demonstration, and Handouts Education comprehension: verbalized understanding and returned demonstration  HOME EXERCISE PROGRAM: 12/19: wrist and elbow ROM  GOALS: Goals reviewed with patient? Yes  SHORT TERM GOALS: Target date: 03/23/24  Pt will be provided and educated on LUE HEP for elbow and wrist mobility for ADL completion.   Goal status: INITIAL  2.  Pt will decrease LUE pain to 3/10 or less in order to sleep for 3+ consecutive hours without waking due to pain.   Goal status: INITIAL  3.  Pt will decrease LUE fascial restrictions to minimal amounts in order to reach out and overhead independently with dressing and bathing.   Goal status: INITIAL  4.  PT will increase L wrist ROM by 15* in order to manipulate bathing items.   Goal status: INITIAL  5.  Pt will increase LUE strength to 5/5 in order to lift and carry items during cooking tasks.   Goal status: INITIAL  6.  Pt will increase LUE grip strength by 10# and pinch strength by 2# in order to grasp and manipulate small items during dressing.   Goal status: INITIAL  ASSESSMENT:  CLINICAL IMPRESSION: Pt did well with today's session. We began session with manual techniques to decrease fascial restrictions. Added two pound weight with wrist ROM-pt demonstrates good movement and reports no pain with this. Targeted grip strength with theraputty and PVC pipe. Pt would continue to benefit from OT  services.   PERFORMANCE DEFICITS: in functional skills including ADLs, IADLs, coordination, ROM, strength, pain, fascial restrictions, muscle spasms, Fine motor control, Gross motor control, body mechanics, and UE functional use.   PLAN:  OT FREQUENCY: 2x/week  OT DURATION: 6 weeks  PLANNED INTERVENTIONS: 97168 OT Re-evaluation, 97535 self care/ADL training, 02889 therapeutic exercise, 97530 therapeutic activity, 97112 neuromuscular re-education, 97140 manual therapy, 97035 ultrasound, 97018 paraffin, 02989 moist heat, 97032 electrical stimulation (manual), passive range of motion, functional mobility training, energy conservation, coping strategies training, patient/family education, and DME and/or AE instructions  RECOMMENDED OTHER SERVICES: N/A  CONSULTED AND AGREED WITH PLAN OF CARE: Patient  PLAN FOR NEXT SESSION: Manual Therapy, Stretching, ROM, gripping   [facility] [phone] Chiquita LOISE Sermon, OTR/L 02/20/2024, 4:12 PM   "

## 2024-02-21 ENCOUNTER — Other Ambulatory Visit: Payer: Self-pay

## 2024-02-29 ENCOUNTER — Ambulatory Visit (HOSPITAL_COMMUNITY): Attending: Neurology | Admitting: Occupational Therapy

## 2024-02-29 ENCOUNTER — Encounter (HOSPITAL_COMMUNITY): Payer: Self-pay | Admitting: Occupational Therapy

## 2024-02-29 DIAGNOSIS — R29898 Other symptoms and signs involving the musculoskeletal system: Secondary | ICD-10-CM | POA: Insufficient documentation

## 2024-02-29 DIAGNOSIS — M25522 Pain in left elbow: Secondary | ICD-10-CM | POA: Insufficient documentation

## 2024-02-29 DIAGNOSIS — R29818 Other symptoms and signs involving the nervous system: Secondary | ICD-10-CM | POA: Insufficient documentation

## 2024-02-29 DIAGNOSIS — R278 Other lack of coordination: Secondary | ICD-10-CM | POA: Insufficient documentation

## 2024-02-29 NOTE — Therapy (Signed)
 " OUTPATIENT OCCUPATIONAL THERAPY ORTHO TREATMENT NOTE  Patient Name: Bobby York MRN: [id number] DOB:[birth year], 69 y.o., male Today's Date: 02/29/2024  PCP: Marvine Rush, MD REFERRING PROVIDER: Lucillie Blades, MD  END OF SESSION:  OT End of Session - 02/29/24 1049     Visit Number 4    Number of Visits 12    Date for Recertification  03/30/24    Authorization Type 1)[employer] 2) [place]    Authorization Time Period Requesting 12 visits    Authorization - Visit Number 4    Authorization - Number of Visits 12    Progress Note Due on Visit 10    OT Start Time [phone]    OT Stop Time 1030    OT Time Calculation (min) 38 min    Activity Tolerance Patient tolerated treatment well    Behavior During Therapy [id number] for tasks assessed/performed            Past Medical History:  Diagnosis Date   Bipolar 1 disorder (HCC)    diagnosed 2004   Dyspnea    Hepatitis    Hep C   Substance abuse (HCC) 2021   Past Surgical History:  Procedure Laterality Date   BACK SURGERY     two-lumbar disc x2   CARPAL TUNNEL WITH CUBITAL TUNNEL Left 11/29/2023   CLAVICLE SURGERY     COLONOSCOPY WITH PROPOFOL  N/A 07/25/2014   MFM:dpwhoz rectal polyp otherwise normal . hyperplastic polyp. TCS in 07/2024   CRANIOTOMY N/A 03/12/2022   Procedure: ENDOSCOPIC ENDONASAL TRANSPHENOIDAL RESECTION OF PITUITARY MASS;  Surgeon: Debby Dorn MATSU, MD;  Location: [facility] OR;  Service: Neurosurgery;  Laterality: N/A;   ESOPHAGEAL DILATION N/A 07/25/2014   Procedure: ESOPHAGEAL DILATION;  Surgeon: Lamar CHRISTELLA Hollingshead, MD;  Location: [facility] ORS;  Service: Endoscopy;  Laterality: N/A;  Malony dilators # 56,   ESOPHAGOGASTRODUODENOSCOPY (EGD) WITH PROPOFOL  N/A 07/25/2014   MFM:fpoi erosive reflux/s/p dilator/HH. +hpylor    NECK SURGERY     TRANSPHENOIDAL APPROACH EXPOSURE N/A 03/12/2022   Procedure: TRANSPHENOIDAL ENDOSCOPIC RESCTION OF PITUITARY ADENOMA;  Surgeon: Llewellyn Gerard LABOR, DO;  Location: [facility] OR;  Service: ENT;   Laterality: N/A;   Patient Active Problem List   Diagnosis Date Noted   H/O partial hypophysectomy 06/21/2022   Pituitary mass 03/12/2022   Pituitary tumor 03/12/2022   DOE (dyspnea on exertion) 08/27/2021   Pneumothorax, traumatic 04/14/2016   H. pylori infection 08/22/2014   Reflux esophagitis    Dysphagia, pharyngoesophageal phase    Mucosal abnormality of stomach    History of colonic polyps    Chronic hepatitis C (HCC) 07/09/2014   Abnormal weight loss 07/09/2014   History of ETOH abuse 07/09/2014   Esophageal dysphagia 07/09/2014   Odynophagia 07/09/2014   Abnormal ECG 05/28/2014    ONSET DATE: 11/29/23  REFERRING DIAG: G56.20 (ICD-10-CM) - Cubital tunnel syndrome   THERAPY DIAG:  Pain in left elbow  Other lack of coordination  Other symptoms and signs involving the musculoskeletal system  Rationale for Evaluation and Treatment: Rehabilitation  SUBJECTIVE:   SUBJECTIVE STATEMENT: S: I'm doing some exercises  PERTINENT HISTORY: Pt s/p L cubital tunnel release. PMH significant for Parkinson's.   PRECAUTIONS: None  WEIGHT BEARING RESTRICTIONS: No  PAIN:  Are you having pain? Yes: NPRS scale: 7/10 Pain location: L elbow Pain description: stabbing Aggravating factors: touch/pressure Relieving factors: aleve takes the edge off  FALLS: Has patient fallen in last 6 months? No  PLOF: Independent  PATIENT GOALS: To decrease pain  NEXT MD VISIT: 04/27/24  OBJECTIVE:  Note: Objective measures were completed at Evaluation unless otherwise noted.  HAND DOMINANCE: Right  ADLs: Overall ADLs: Pt unable to complete fine motor tasks, unable to manipulate buttons and zippers, as well as unable to don shoes and tie laces. Pt reports also being unable to fully reach up or out to grab light things, as he often drops them.   FUNCTIONAL OUTCOME MEASURES: Quick Dash: 86.36  UPPER EXTREMITY ROM:     Active ROM Left eval  Elbow flexion 135  Elbow extension 0   Wrist flexion 36  Wrist extension 37  Wrist ulnar deviation 22  Wrist radial deviation 8  Wrist pronation WFL  Wrist supination WFL  (Blank rows = not tested)  UPPER EXTREMITY MMT:     MMT Left eval  Elbow flexion 4+/5  Elbow extension 4/5  Wrist flexion 4/5  Wrist extension 4-/5  Wrist ulnar deviation 4-/5  Wrist radial deviation 4-/5  Wrist pronation 4-/5  Wrist supination 4-/5  (Blank rows = not tested)  HAND FUNCTION: Grip strength: Right: 24 lbs; Left: 39 lbs, Lateral pinch: Right: 3 lbs, Left: 4 lbs, and 3 point pinch: Right: 3 lbs, Left: 3 lbs  COORDINATION: 9 Hole Peg test: Right: 1'04 sec; Left: 42.46 sec  SENSATION: Mild decreased tactile sensation on the palmar aspect of L hand  OBSERVATIONS: moderate fascial restrictions noted along the lateral aspect of the elbow, forearm, and biceps.    TREATMENT DATE:  02/29/24 -Manual techniques: myofascial release and trigger point applied to medial and lateral aspect of elbow, forearm, and biceps in order to reduce pain and fascial restrictions as well as improve ROM.  -US : Trialed US  to left elbow region to decrease pain, 1.5 w/cm2, 5'  -A/ROM: bicep curl, hammer curl, pronated, 10 reps -A/ROM: wrist-flexion, extension, ulnar/radial deviation, supination/pronation, 10 reps -Grip strengthening: hand gripper at 35#, vertical, large and medium beads -Pinch strengthening: pt using red clothespin to grasp and stack 3 towers of 5 sponges using 3 point pinch. Replaced into bucket with lateral pinch  02/20/24 -Manual Therapy: myofascial release and trigger point applied to medial and lateral aspect of elbow, forearm, and biceps in order to reduce pain and fascial restrictions as well as improve ROM.  -Elbow ROM: neutral flexion/extension, supinated flexion/extension, pronated flexion/extension, x12 -Wrist Rom: flexion, extension, ulnar/radial deviation, supination/pronation, x10 -Wrist strength: flexion,extension,  radial/ulnar deviation, supination/pronation 10x 2 pound weight -Bicep strength: curls, hammer curls 10x each 2 pound weight                                     -Theraputty: flatten, PVC pipe to cut cookies to improve grip strength  02/14/24 -Manual Therapy: myofascial release and trigger point applied to medial and lateral aspect of elbow, forearm, and biceps in order to reduce pain and fascial restrictions as well as improve ROM.  -Wrist Rom: flexion, extension, ulnar/radial deviation, supination/pronation, x10 -Elbow ROM: neutral flexion/extension, supinated flexion/extension, pronated flexion/extension, x12 -Gripper: 25# medium and large beads, 23# medium and large beads     -wrist stretches: extension and flexion, 5x20  PATIENT EDUCATION: Education details: Reviewed HEP Person educated: Patient Education method: Explanation, Demonstration, and Handouts Education comprehension: verbalized understanding and returned demonstration  HOME EXERCISE PROGRAM: 12/19: wrist and elbow ROM  GOALS: Goals reviewed with patient? Yes  SHORT TERM GOALS: Target date: 03/23/24  Pt will be provided and educated on LUE HEP for elbow and wrist mobility for ADL completion.   Goal status: IN PROGRESS  2.  Pt will decrease LUE pain to 3/10 or less in order to sleep for 3+ consecutive hours without waking due to pain.   Goal status:  IN PROGRESS  3.  Pt will decrease LUE fascial restrictions to minimal amounts in order to reach out and overhead independently with dressing and bathing.   Goal status:  IN PROGRESS  4.  PT will increase L wrist ROM by 15* in order to manipulate bathing items.   Goal status:  IN PROGRESS  5.  Pt will increase LUE strength to 5/5 in order to lift and carry items during cooking tasks.   Goal status: IN PROGRESS  6.  Pt will increase LUE  grip strength by 10# and pinch strength by 2# in order to grasp and manipulate small items during dressing.   Goal status:  IN PROGRESS  ASSESSMENT:  CLINICAL IMPRESSION: Pt reports high pain today at the left elbow, trialed US  today. Downgraded to A/ROM versus strengthening due to pain today. Pt with full range of motion in elbow and wrist, pt reports no pain during A/ROM tasks. Pt completing grip strengthening work for improved functional use during daily tasks. Upgraded resistance due to ease of completion at lower resistance. Verbal cuing for form and technique. Pt reports pain as 4/10 at end of session compared to 7/10 at beginning of session.     PERFORMANCE DEFICITS: in functional skills including ADLs, IADLs, coordination, ROM, strength, pain, fascial restrictions, muscle spasms, Fine motor control, Gross motor control, body mechanics, and UE functional use.   PLAN:  OT FREQUENCY: 2x/week  OT DURATION: 6 weeks  PLANNED INTERVENTIONS: 97168 OT Re-evaluation, 97535 self care/ADL training, 02889 therapeutic exercise, 97530 therapeutic activity, 97112 neuromuscular re-education, 97140 manual therapy, 97035 ultrasound, 97018 paraffin, 02989 moist heat, 97032 electrical stimulation (manual), passive range of motion, functional mobility training, energy conservation, coping strategies training, patient/family education, and DME and/or AE instructions  CONSULTED AND AGREED WITH PLAN OF CARE: Patient  PLAN FOR NEXT SESSION: Manual Therapy, Stretching, ROM, gripping   Sonny Cory, OTR/L  [phone] 02/29/2024, 10:50 AM   "

## 2024-03-06 ENCOUNTER — Ambulatory Visit (HOSPITAL_COMMUNITY): Admitting: Occupational Therapy

## 2024-03-08 ENCOUNTER — Ambulatory Visit (HOSPITAL_COMMUNITY): Admitting: Occupational Therapy

## 2024-03-08 ENCOUNTER — Encounter (HOSPITAL_COMMUNITY): Payer: Self-pay | Admitting: Occupational Therapy

## 2024-03-08 DIAGNOSIS — R29898 Other symptoms and signs involving the musculoskeletal system: Secondary | ICD-10-CM

## 2024-03-08 DIAGNOSIS — M25522 Pain in left elbow: Secondary | ICD-10-CM

## 2024-03-08 DIAGNOSIS — R278 Other lack of coordination: Secondary | ICD-10-CM

## 2024-03-08 NOTE — Patient Instructions (Signed)
AROM Exercises   1) Wrist Flexion  Start with wrist at edge of table, palm facing up. With wrist hanging slightly off table, curl wrist upward, and back down.      2) Wrist Extension  Start with wrist at edge of table, palm facing down. With wrist slightly off the edge of the table, curl wrist up and back down.      3) Radial Deviations  Start with forearm flat against a table, wrist hanging slightly off the edge, and palm facing the wall. Bending at the wrist only, and keeping palm facing the wall, bend wrist so fist is pointing towards the floor, back up to start position, and up towards the ceiling. Return to start.        4) WRIST PRONATION  Turn your forearm towards palm face down.  Keep your elbow bent and by the side of your  Body.      5) WRIST SUPINATION  Turn your forearm towards palm face up.  Keep your elbow bent and by the side of your  Body.      *Complete exercises ______ times each, _______ times per day*      Strengthening Exercises  1) WRIST EXTENSION CURLS - TABLE  Hold a small free weight, rest your forearm on a table and bend your wrist up and down with your palm face down as shown.      2) WRIST FLEXION CURLS - TABLE  Hold a small free weight, rest your forearm on a table and bend your wrist up and down with your palm face up as shown.     3) FREE WEIGHT RADIAL/ULNAR DEVIATION - TABLE  Hold a small free weight, rest your forearm on a table and bend your wrist up and down with your palm facing towards the side as shown.     4) Pronation  Forearm supported on table with wrist in neutral position. Using a weight, roll wrist so that palm faces downward. Hold for 2 seconds and return to starting position.     5) Supination  Forearm supported on table with wrist in neutral position. Using a weight, roll wrist so that palm is now facing upward. Hold for 2 seconds and return to starting position.      *Complete  exercises using ____ pound weight, ____times each, ____times per day*  

## 2024-03-08 NOTE — Therapy (Signed)
 " OUTPATIENT OCCUPATIONAL THERAPY ORTHO TREATMENT NOTE  Patient Name: Bobby York MRN: [id number] DOB:October 18, 1955, 69 y.o., male Today's Date: 03/08/2024  PCP: Marvine Rush, MD REFERRING PROVIDER: Lucillie Blades, MD  END OF SESSION:  OT End of Session - 03/08/24 1249     Visit Number 5    Number of Visits 12    Date for Recertification  03/30/24    Authorization Type 1)[employer]are 2) [place]    Authorization Time Period Requesting 12 visits    Authorization - Visit Number 5    Authorization - Number of Visits 12    Progress Note Due on Visit 10    OT Start Time 1133    OT Stop Time 1219    OT Time Calculation (min) 46 min    Activity Tolerance Patient tolerated treatment well    Behavior During Therapy [facility] for tasks assessed/performed          Past Medical History:  Diagnosis Date   Bipolar 1 disorder (HCC)    diagnosed 2004   Dyspnea    Hepatitis    Hep C   Substance abuse (HCC) 2021   Past Surgical History:  Procedure Laterality Date   BACK SURGERY     two-lumbar disc x2   CARPAL TUNNEL WITH CUBITAL TUNNEL Left 11/29/2023   CLAVICLE SURGERY     COLONOSCOPY WITH PROPOFOL  N/A 07/25/2014   MFM:dpwhoz rectal polyp otherwise normal . hyperplastic polyp. TCS in 07/2024   CRANIOTOMY N/A 03/12/2022   Procedure: ENDOSCOPIC ENDONASAL TRANSPHENOIDAL RESECTION OF PITUITARY MASS;  Surgeon: Debby Dorn MATSU, MD;  Location: [facility] OR;  Service: Neurosurgery;  Laterality: N/A;   ESOPHAGEAL DILATION N/A 07/25/2014   Procedure: ESOPHAGEAL DILATION;  Surgeon: Lamar CHRISTELLA Hollingshead, MD;  Location: [facility] ORS;  Service: Endoscopy;  Laterality: N/A;  Malony dilators # 56,   ESOPHAGOGASTRODUODENOSCOPY (EGD) WITH PROPOFOL  N/A 07/25/2014   MFM:fpoi erosive reflux/s/p dilator/HH. +hpylor    NECK SURGERY     TRANSPHENOIDAL APPROACH EXPOSURE N/A 03/12/2022   Procedure: TRANSPHENOIDAL ENDOSCOPIC RESCTION OF PITUITARY ADENOMA;  Surgeon: Llewellyn Gerard LABOR, DO;  Location: [facility] OR;  Service: ENT;   Laterality: N/A;   Patient Active Problem List   Diagnosis Date Noted   H/O partial hypophysectomy 06/21/2022   Pituitary mass 03/12/2022   Pituitary tumor 03/12/2022   DOE (dyspnea on exertion) 08/27/2021   Pneumothorax, traumatic 04/14/2016   H. pylori infection 08/22/2014   Reflux esophagitis    Dysphagia, pharyngoesophageal phase    Mucosal abnormality of stomach    History of colonic polyps    Chronic hepatitis C (HCC) 07/09/2014   Abnormal weight loss 07/09/2014   History of ETOH abuse 07/09/2014   Esophageal dysphagia 07/09/2014   Odynophagia 07/09/2014   Abnormal ECG 05/28/2014    ONSET DATE: 11/29/23  REFERRING DIAG: G56.20 (ICD-10-CM) - Cubital tunnel syndrome   THERAPY DIAG:  Pain in left elbow  Other lack of coordination  Other symptoms and signs involving the musculoskeletal system  Rationale for Evaluation and Treatment: Rehabilitation  SUBJECTIVE:   SUBJECTIVE STATEMENT: S: I'm doing some exercises  PERTINENT HISTORY: Pt s/p L cubital tunnel release. PMH significant for Parkinson's.   PRECAUTIONS: None  WEIGHT BEARING RESTRICTIONS: No  PAIN:  Are you having pain? Yes: NPRS scale: 7/10 Pain location: L elbow Pain description: stabbing Aggravating factors: touch/pressure Relieving factors: aleve takes the edge off  FALLS: Has patient fallen in last 6 months? No  PLOF: Independent  PATIENT GOALS: To decrease pain  NEXT  MD VISIT: 04/27/24  OBJECTIVE:  Note: Objective measures were completed at Evaluation unless otherwise noted.  HAND DOMINANCE: Right  ADLs: Overall ADLs: Pt unable to complete fine motor tasks, unable to manipulate buttons and zippers, as well as unable to don shoes and tie laces. Pt reports also being unable to fully reach up or out to grab light things, as he often drops them.   FUNCTIONAL OUTCOME MEASURES: Quick Dash: 86.36  UPPER EXTREMITY ROM:     Active ROM Left eval  Elbow flexion 135  Elbow extension 0   Wrist flexion 36  Wrist extension 37  Wrist ulnar deviation 22  Wrist radial deviation 8  Wrist pronation WFL  Wrist supination WFL  (Blank rows = not tested)  UPPER EXTREMITY MMT:     MMT Left eval  Elbow flexion 4+/5  Elbow extension 4/5  Wrist flexion 4/5  Wrist extension 4-/5  Wrist ulnar deviation 4-/5  Wrist radial deviation 4-/5  Wrist pronation 4-/5  Wrist supination 4-/5  (Blank rows = not tested)  HAND FUNCTION: Grip strength: Right: 24 lbs; Left: 39 lbs, Lateral pinch: Right: 3 lbs, Left: 4 lbs, and 3 point pinch: Right: 3 lbs, Left: 3 lbs  COORDINATION: 9 Hole Peg test: Right: 1'04 sec; Left: 42.46 sec  SENSATION: Mild decreased tactile sensation on the palmar aspect of L hand  OBSERVATIONS: moderate fascial restrictions noted along the lateral aspect of the elbow, forearm, and biceps.    TREATMENT DATE:  02/29/24 -Manual techniques: myofascial release and trigger point applied to medial and lateral aspect of elbow, forearm, and biceps in order to reduce pain and fascial restrictions as well as improve ROM.  -US : Trialed US  to left elbow region to decrease pain, 1.5 w/cm2, 5'  -A/ROM: bicep curl, hammer curl, pronated, 10 reps -A/ROM: wrist-flexion, extension, ulnar/radial deviation, supination/pronation, 10 reps -Grip strengthening: hand gripper at 35#, vertical, large and medium beads -Pinch strengthening: pt using red clothespin to grasp and stack 3 towers of 5 sponges using 3 point pinch. Replaced into bucket with lateral pinch  02/20/24 -Manual Therapy: myofascial release and trigger point applied to medial and lateral aspect of elbow, forearm, and biceps in order to reduce pain and fascial restrictions as well as improve ROM.  -Elbow ROM: neutral flexion/extension, supinated flexion/extension, pronated flexion/extension, x12 -Wrist Rom: flexion, extension, ulnar/radial deviation, supination/pronation, x10 -Wrist strength: flexion,extension,  radial/ulnar deviation, supination/pronation 10x 2 pound weight -Bicep strength: curls, hammer curls 10x each 2 pound weight                                     -Theraputty: flatten, PVC pipe to cut cookies to improve grip strength  02/14/24 -Manual Therapy: myofascial release and trigger point applied to medial and lateral aspect of elbow, forearm, and biceps in order to reduce pain and fascial restrictions as well as improve ROM.  -Wrist Rom: flexion, extension, ulnar/radial deviation, supination/pronation, x10 -Elbow ROM: neutral flexion/extension, supinated flexion/extension, pronated flexion/extension, x12 -Gripper: 25# medium and large beads, 23# medium and large beads     -wrist stretches: extension and flexion, 5x20  PATIENT EDUCATION: Education details: Reviewed HEP Person educated: Patient Education method: Explanation, Demonstration, and Handouts Education comprehension: verbalized understanding and returned demonstration  HOME EXERCISE PROGRAM: 12/19: wrist and elbow ROM  GOALS: Goals reviewed with patient? Yes  SHORT TERM GOALS: Target date: 03/23/24  Pt will be provided and educated on LUE HEP for elbow and wrist mobility for ADL completion.   Goal status: IN PROGRESS  2.  Pt will decrease LUE pain to 3/10 or less in order to sleep for 3+ consecutive hours without waking due to pain.   Goal status:  IN PROGRESS  3.  Pt will decrease LUE fascial restrictions to minimal amounts in order to reach out and overhead independently with dressing and bathing.   Goal status:  IN PROGRESS  4.  PT will increase L wrist ROM by 15* in order to manipulate bathing items.   Goal status:  IN PROGRESS  5.  Pt will increase LUE strength to 5/5 in order to lift and carry items during cooking tasks.   Goal status: IN PROGRESS  6.  Pt will increase LUE  grip strength by 10# and pinch strength by 2# in order to grasp and manipulate small items during dressing.   Goal status:  IN PROGRESS  ASSESSMENT:  CLINICAL IMPRESSION: This session pt had severe pain this session as well as dizziness, limiting his session. OT fabricated and educated pt on resting elbow splint this session to reduce nerve pain and limiting flexing during sleep. Pt and OT reviewed exercises to complete when pain decreased.   PERFORMANCE DEFICITS: in functional skills including ADLs, IADLs, coordination, ROM, strength, pain, fascial restrictions, muscle spasms, Fine motor control, Gross motor control, body mechanics, and UE functional use.   PLAN:  OT FREQUENCY: 2x/week  OT DURATION: 6 weeks  PLANNED INTERVENTIONS: 97168 OT Re-evaluation, 97535 self care/ADL training, 02889 therapeutic exercise, 97530 therapeutic activity, 97112 neuromuscular re-education, 97140 manual therapy, 97035 ultrasound, 97018 paraffin, 02989 moist heat, 97032 electrical stimulation (manual), passive range of motion, functional mobility training, energy conservation, coping strategies training, patient/family education, and DME and/or AE instructions  CONSULTED AND AGREED WITH PLAN OF CARE: Patient  PLAN FOR NEXT SESSION: Manual Therapy, Stretching, ROM, gripping   Valentin Nightingale, OTR/L [phone] 03/08/2024, 12:51 PM   "

## 2024-03-12 ENCOUNTER — Encounter (HOSPITAL_COMMUNITY): Payer: Self-pay | Admitting: Occupational Therapy

## 2024-03-12 ENCOUNTER — Ambulatory Visit (HOSPITAL_COMMUNITY): Admitting: Occupational Therapy

## 2024-03-12 DIAGNOSIS — R278 Other lack of coordination: Secondary | ICD-10-CM

## 2024-03-12 DIAGNOSIS — M25522 Pain in left elbow: Secondary | ICD-10-CM

## 2024-03-12 DIAGNOSIS — R29898 Other symptoms and signs involving the musculoskeletal system: Secondary | ICD-10-CM

## 2024-03-12 NOTE — Therapy (Signed)
 " OUTPATIENT OCCUPATIONAL THERAPY ORTHO TREATMENT NOTE  Patient Name: Bobby York MRN: [id number] DOB:1955-04-10, 69 y.o., male Today's Date: 03/12/2024  PCP: Marvine Rush, MD REFERRING PROVIDER: Lucillie Blades, MD  END OF SESSION:  OT End of Session - 03/12/24 1148     Visit Number 6    Number of Visits 12    Date for Recertification  03/30/24    Authorization Type 1)[employer] medicare 2) [place]    Authorization Time Period Requesting 12 visits    Authorization - Visit Number 6    Authorization - Number of Visits 12    Progress Note Due on Visit 10    OT Start Time 1053    OT Stop Time 1132    OT Time Calculation (min) 39 min    Activity Tolerance Patient tolerated treatment well    Behavior During Therapy [facility] for tasks assessed/performed           Past Medical History:  Diagnosis Date   Bipolar 1 disorder (HCC)    diagnosed 2004   Dyspnea    Hepatitis    Hep C   Substance abuse (HCC) 2021   Past Surgical History:  Procedure Laterality Date   BACK SURGERY     two-lumbar disc x2   CARPAL TUNNEL WITH CUBITAL TUNNEL Left 11/29/2023   CLAVICLE SURGERY     COLONOSCOPY WITH PROPOFOL  N/A 07/25/2014   MFM:dpwhoz rectal polyp otherwise normal . hyperplastic polyp. TCS in 07/2024   CRANIOTOMY N/A 03/12/2022   Procedure: ENDOSCOPIC ENDONASAL TRANSPHENOIDAL RESECTION OF PITUITARY MASS;  Surgeon: Debby Dorn MATSU, MD;  Location: [facility] OR;  Service: Neurosurgery;  Laterality: N/A;   ESOPHAGEAL DILATION N/A 07/25/2014   Procedure: ESOPHAGEAL DILATION;  Surgeon: Lamar CHRISTELLA Hollingshead, MD;  Location: [facility] ORS;  Service: Endoscopy;  Laterality: N/A;  Malony dilators # 56,   ESOPHAGOGASTRODUODENOSCOPY (EGD) WITH PROPOFOL  N/A 07/25/2014   MFM:fpoi erosive reflux/s/p dilator/HH. +hpylor    NECK SURGERY     TRANSPHENOIDAL APPROACH EXPOSURE N/A 03/12/2022   Procedure: TRANSPHENOIDAL ENDOSCOPIC RESCTION OF PITUITARY ADENOMA;  Surgeon: Llewellyn Gerard LABOR, DO;  Location: [facility] OR;  Service: ENT;   Laterality: N/A;   Patient Active Problem List   Diagnosis Date Noted   H/O partial hypophysectomy 06/21/2022   Pituitary mass 03/12/2022   Pituitary tumor 03/12/2022   DOE (dyspnea on exertion) 08/27/2021   Pneumothorax, traumatic 04/14/2016   H. pylori infection 08/22/2014   Reflux esophagitis    Dysphagia, pharyngoesophageal phase    Mucosal abnormality of stomach    History of colonic polyps    Chronic hepatitis C (HCC) 07/09/2014   Abnormal weight loss 07/09/2014   History of ETOH abuse 07/09/2014   Esophageal dysphagia 07/09/2014   Odynophagia 07/09/2014   Abnormal ECG 05/28/2014    ONSET DATE: 11/29/23  REFERRING DIAG: G56.20 (ICD-10-CM) - Cubital tunnel syndrome   THERAPY DIAG:  Pain in left elbow  Other lack of coordination  Other symptoms and signs involving the musculoskeletal system  Rationale for Evaluation and Treatment: Rehabilitation  SUBJECTIVE:   SUBJECTIVE STATEMENT: S: It always hurts  PERTINENT HISTORY: Pt s/p L cubital tunnel release. PMH significant for Parkinson's.   PRECAUTIONS: None  WEIGHT BEARING RESTRICTIONS: No  PAIN:  Are you having pain? Yes: NPRS scale: 6/10 Pain location: L elbow Pain description: stabbing Aggravating factors: touch/pressure Relieving factors: aleve takes the edge off  FALLS: Has patient fallen in last 6 months? No  PLOF: Independent  PATIENT GOALS: To decrease pain  NEXT  MD VISIT: 04/27/24  OBJECTIVE:  Note: Objective measures were completed at Evaluation unless otherwise noted.  HAND DOMINANCE: Right  ADLs: Overall ADLs: Pt unable to complete fine motor tasks, unable to manipulate buttons and zippers, as well as unable to don shoes and tie laces. Pt reports also being unable to fully reach up or out to grab light things, as he often drops them.   FUNCTIONAL OUTCOME MEASURES: Quick Dash: 86.36  UPPER EXTREMITY ROM:     Active ROM Left eval  Elbow flexion 135  Elbow extension 0  Wrist  flexion 36  Wrist extension 37  Wrist ulnar deviation 22  Wrist radial deviation 8  Wrist pronation WFL  Wrist supination WFL  (Blank rows = not tested)  UPPER EXTREMITY MMT:     MMT Left eval  Elbow flexion 4+/5  Elbow extension 4/5  Wrist flexion 4/5  Wrist extension 4-/5  Wrist ulnar deviation 4-/5  Wrist radial deviation 4-/5  Wrist pronation 4-/5  Wrist supination 4-/5  (Blank rows = not tested)  HAND FUNCTION: Grip strength: Right: 24 lbs; Left: 39 lbs, Lateral pinch: Right: 3 lbs, Left: 4 lbs, and 3 point pinch: Right: 3 lbs, Left: 3 lbs  COORDINATION: 9 Hole Peg test: Right: 1'04 sec; Left: 42.46 sec  SENSATION: Mild decreased tactile sensation on the palmar aspect of L hand  OBSERVATIONS: moderate fascial restrictions noted along the lateral aspect of the elbow, forearm, and biceps.    TREATMENT DATE:   03/12/24 -Manual techniques: myofascial release and trigger point applied to medial and lateral aspect of elbow, forearm, and biceps in order to reduce pain and fascial restrictions as well as improve ROM. -Wrist ROM: flexion, extension, ulnar/radial deviation, supination/pronation, x10 -A/ROM: Bicep curls, hammer curls, pronated curls, x10 -Digiflexion: 3#, full squeeze x10, each digit x6 -Pinch Tree: yellow, red, green resistance clips, tripod up, lateral down.  02/29/24 -Manual techniques: myofascial release and trigger point applied to medial and lateral aspect of elbow, forearm, and biceps in order to reduce pain and fascial restrictions as well as improve ROM.  -US : Trialed US  to left elbow region to decrease pain, 1.5 w/cm2, 5'  -A/ROM: bicep curl, hammer curl, pronated, 10 reps -A/ROM: wrist-flexion, extension, ulnar/radial deviation, supination/pronation, 10 reps -Grip strengthening: hand gripper at 35#, vertical, large and medium beads -Pinch strengthening: pt using red clothespin to grasp and stack 3 towers of 5 sponges using 3 point pinch. Replaced  into bucket with lateral pinch  02/20/24 -Manual Therapy: myofascial release and trigger point applied to medial and lateral aspect of elbow, forearm, and biceps in order to reduce pain and fascial restrictions as well as improve ROM.  -Elbow ROM: neutral flexion/extension, supinated flexion/extension, pronated flexion/extension, x12 -Wrist Rom: flexion, extension, ulnar/radial deviation, supination/pronation, x10 -Wrist strength: flexion,extension, radial/ulnar deviation, supination/pronation 10x 2 pound weight -Bicep strength: curls, hammer curls 10x each 2 pound weight                                     -Theraputty: flatten, PVC pipe to cut cookies to improve grip strength  02/14/24 -Manual Therapy: myofascial release and trigger point applied to medial and lateral aspect of elbow, forearm, and biceps in order to reduce pain and fascial restrictions as well as improve ROM.  -Wrist Rom: flexion, extension, ulnar/radial deviation, supination/pronation, x10 -Elbow ROM: neutral flexion/extension, supinated flexion/extension, pronated flexion/extension, x12 -Gripper: 25# medium and large beads, 23#  medium and large beads     -wrist stretches: extension and flexion, 5x20                                                                                                                                PATIENT EDUCATION: Education details: Reviewed HEP Person educated: Patient Education method: Explanation, Demonstration, and Handouts Education comprehension: verbalized understanding and returned demonstration  HOME EXERCISE PROGRAM: 12/19: wrist and elbow ROM  GOALS: Goals reviewed with patient? Yes  SHORT TERM GOALS: Target date: 03/23/24  Pt will be provided and educated on LUE HEP for elbow and wrist mobility for ADL completion.   Goal status: IN PROGRESS  2.  Pt will decrease LUE pain to 3/10 or less in order to sleep for 3+ consecutive hours without waking due to pain.   Goal  status:  IN PROGRESS  3.  Pt will decrease LUE fascial restrictions to minimal amounts in order to reach out and overhead independently with dressing and bathing.   Goal status:  IN PROGRESS  4.  PT will increase L wrist ROM by 15* in order to manipulate bathing items.   Goal status:  IN PROGRESS  5.  Pt will increase LUE strength to 5/5 in order to lift and carry items during cooking tasks.   Goal status: IN PROGRESS  6.  Pt will increase LUE grip strength by 10# and pinch strength by 2# in order to grasp and manipulate small items during dressing.   Goal status:  IN PROGRESS  ASSESSMENT:  CLINICAL IMPRESSION: Pt continues to have significant pain, however he reported that the brace did help with his pain some. Pt worked on ROM and hand strengthening with no increase in pain. Overall good ROM and strength is improving. OT providing verbal and tactile cuing for positioning and technique throughout session.   PERFORMANCE DEFICITS: in functional skills including ADLs, IADLs, coordination, ROM, strength, pain, fascial restrictions, muscle spasms, Fine motor control, Gross motor control, body mechanics, and UE functional use.   PLAN:  OT FREQUENCY: 2x/week  OT DURATION: 6 weeks  PLANNED INTERVENTIONS: 97168 OT Re-evaluation, 97535 self care/ADL training, 02889 therapeutic exercise, 97530 therapeutic activity, 97112 neuromuscular re-education, 97140 manual therapy, 97035 ultrasound, 97018 paraffin, 02989 moist heat, 97032 electrical stimulation (manual), passive range of motion, functional mobility training, energy conservation, coping strategies training, patient/family education, and DME and/or AE instructions  CONSULTED AND AGREED WITH PLAN OF CARE: Patient  PLAN FOR NEXT SESSION: Manual Therapy, Stretching, ROM, gripping   Valentin Nightingale, OTR/L [phone] 03/12/2024, 11:49 AM   "

## 2024-03-13 ENCOUNTER — Encounter (HOSPITAL_COMMUNITY): Payer: Self-pay

## 2024-03-13 ENCOUNTER — Other Ambulatory Visit: Payer: Self-pay

## 2024-03-13 ENCOUNTER — Emergency Department (HOSPITAL_COMMUNITY)
Admission: EM | Admit: 2024-03-13 | Discharge: 2024-03-13 | Disposition: A | Discharge status: Home / Self Care | Attending: Emergency Medicine | Admitting: Emergency Medicine

## 2024-03-13 DIAGNOSIS — E876 Hypokalemia: Secondary | ICD-10-CM | POA: Diagnosis not present

## 2024-03-13 DIAGNOSIS — R040 Epistaxis: Secondary | ICD-10-CM | POA: Insufficient documentation

## 2024-03-13 DIAGNOSIS — G20A1 Parkinson's disease without dyskinesia, without mention of fluctuations: Secondary | ICD-10-CM | POA: Diagnosis not present

## 2024-03-13 HISTORY — DX: Parkinson's disease without dyskinesia, without mention of fluctuations: G20.A1

## 2024-03-13 LAB — CBC WITH DIFFERENTIAL/PLATELET
Abs Immature Granulocytes: 0.03 K/uL (ref 0.00–0.07)
Basophils Absolute: 0 K/uL (ref 0.0–0.1)
Basophils Relative: 0 %
Eosinophils Absolute: 0.2 K/uL (ref 0.0–0.5)
Eosinophils Relative: 4 %
HCT: 40.5 % (ref 39.0–52.0)
Hemoglobin: 13.6 g/dL (ref 13.0–17.0)
Immature Granulocytes: 1 %
Lymphocytes Relative: 37 %
Lymphs Abs: 2.1 K/uL (ref 0.7–4.0)
MCH: 31.4 pg (ref 26.0–34.0)
MCHC: 33.6 g/dL (ref 30.0–36.0)
MCV: 93.5 fL (ref 80.0–100.0)
Monocytes Absolute: 0.4 K/uL (ref 0.1–1.0)
Monocytes Relative: 6 %
Neutro Abs: 2.9 K/uL (ref 1.7–7.7)
Neutrophils Relative %: 52 %
Platelets: 257 K/uL (ref 150–400)
RBC: 4.33 MIL/uL (ref 4.22–5.81)
RDW: 11.9 % (ref 11.5–15.5)
WBC: 5.7 K/uL (ref 4.0–10.5)
nRBC: 0 % (ref 0.0–0.2)

## 2024-03-13 LAB — BASIC METABOLIC PANEL WITH GFR
Anion gap: 14 (ref 5–15)
BUN: 15 mg/dL (ref 8–23)
CO2: 28 mmol/L (ref 22–32)
Calcium: 9 mg/dL (ref 8.9–10.3)
Chloride: 101 mmol/L (ref 98–111)
Creatinine, Ser: 1.2 mg/dL (ref 0.61–1.24)
GFR, Estimated: >60 mL/min
Glucose, Bld: 97 mg/dL (ref 70–99)
Potassium: 3 mmol/L — ABNORMAL LOW (ref 3.5–5.1)
Sodium: 143 mmol/L (ref 135–145)

## 2024-03-13 LAB — MAGNESIUM: Magnesium: 1.9 mg/dL (ref 1.7–2.4)

## 2024-03-13 MED ORDER — SILVER NITRATE-POT NITRATE 75-25 % EX MISC
CUTANEOUS | Status: AC
  Filled 2024-03-13: qty 10

## 2024-03-13 MED ORDER — SILVER SULFADIAZINE 1 % EX CREA
TOPICAL_CREAM | Freq: Once | CUTANEOUS | Status: AC
  Filled 2024-03-13: qty 50

## 2024-03-13 MED ORDER — OXYMETAZOLINE HCL 0.05 % NA SOLN
1.0000 | Freq: Once | NASAL | Status: AC
  Administered 2024-03-13: 1 via NASAL
  Filled 2024-03-13: qty 30

## 2024-03-13 NOTE — ED Triage Notes (Signed)
 Pt arrived via POV c/o 2 episodes of epistaxis today. Bleeding controlled at this time. Pts significant other reports when the Pt had a tumor removed from his brain 2 years ago, the surgeon went through his nose on the right side as well. Pts significant other also reports the Pt was recently started on Fish Oil pills and is unsure if this is a cause of the nose bleeds.

## 2024-03-13 NOTE — ED Provider Notes (Signed)
 " [place] EMERGENCY DEPARTMENT AT [facility] Provider Note   CSN: [id number] Arrival date & time: 03/13/24  1725     Patient presents with: Epistaxis   Bobby York is a 69 y.o. male.   Pt is a 69 yo male with pmhx significant for bipolar d/o, hep C, Parkinson's disease, and pituitary adenoma (s/p rsxn).  Pt has had some nose bleeds for the past few days.  Bleeding has stopped now.  It mainly bleeds out of his right nare, but it has been bleeding some out of his left.  Pt is not on blood thinners, but takes Excedrin a lot for his chronic headaches.  K was low a few weeks ago, so he's been taking K.  He was also recently started on Fish Oil.  He's wondering if that's related.       Prior to Admission medications  Medication Sig Start Date End Date Taking? Authorizing Provider  carbidopa -levodopa  (SINEMET  IR) 25-100 MG tablet Take 1 tablet by mouth 4 (four) times daily. 09/23/23   Athar, Saima, MD  Cholecalciferol 125 MCG (5000 UT) TABS Take 5,000 Units by mouth daily. Patient not taking: Reported on 12/30/2023    [provider]  citalopram  (CELEXA ) 20 MG tablet Take 1 tablet by mouth once daily 02/21/24   Camara, Amadou, MD  fludrocortisone  (FLORINEF ) 0.1 MG tablet Take 1 tablet (100 mcg total) by mouth 2 (two) times daily. 07/22/23   Camara, Amadou, MD  lidocaine  (LIDODERM ) 5 % Place 1 patch onto the skin daily. Remove & Discard patch within 12 hours or as directed by MD 02/18/24   Francesca Elsie CROME, MD  Omega-3 Fatty Acids (FISH OIL OMEGA-3 PO) Take 1 tablet by mouth daily.    [provider]  oxyCODONE  (ROXICODONE ) 5 MG immediate release tablet Take 1 tablet (5 mg total) by mouth every 6 (six) hours as needed for severe pain (pain score 7-10). 02/18/24   Francesca Elsie CROME, MD  pantoprazole  (PROTONIX ) 40 MG tablet Take 40 mg by mouth daily.    [provider]  traZODone  (DESYREL ) 50 MG tablet Take 25 mg by mouth at bedtime. 04/30/20    [provider]    Allergies: Atorvastatin    Review of Systems  HENT:  Positive for nosebleeds.   All other systems reviewed and are negative.   Updated Vital Signs BP 101/79 (BP Location: Right Arm)   Pulse 84   Temp 98.6 F (37 C) (Oral)   Resp 20   Ht 5' 10 (1.778 m)   Wt 86.2 kg   SpO2 96%   BMI 27.26 kg/m   Physical Exam Vitals and nursing note reviewed.  Constitutional:      Appearance: Normal appearance.  HENT:     Head: Normocephalic and atraumatic.     Comments: Dried blood in both nares    Right Ear: External ear normal.     Left Ear: External ear normal.     Mouth/Throat:     Mouth: Mucous membranes are moist.     Pharynx: Oropharynx is clear.  Eyes:     Extraocular Movements: Extraocular movements intact.     Conjunctiva/sclera: Conjunctivae normal.     Pupils: Pupils are equal, round, and reactive to light.  Cardiovascular:     Rate and Rhythm: Normal rate and regular rhythm.     Pulses: Normal pulses.     Heart sounds: Normal heart sounds.  Pulmonary:     Effort: Pulmonary effort is  normal.     Breath sounds: Normal breath sounds.  Abdominal:     General: Abdomen is flat. Bowel sounds are normal.     Palpations: Abdomen is soft.  Musculoskeletal:        General: Normal range of motion.  Skin:    General: Skin is warm.     Capillary Refill: Capillary refill takes less than 2 seconds.  Neurological:     General: No focal deficit present.     Mental Status: He is alert and oriented to person, place, and time.  Psychiatric:        Mood and Affect: Mood normal.        Behavior: Behavior normal.     (all labs ordered are listed, but only abnormal results are displayed) Labs Reviewed  BASIC METABOLIC PANEL WITH GFR - Abnormal; Notable for the following components:      Result Value   Potassium 3.0 (*)    All other components within normal limits  CBC WITH DIFFERENTIAL/PLATELET  MAGNESIUM    EKG: None  Radiology: No results  found.   Epistaxis Management  Date/Time: 03/13/2024 9:06 PM  Performed by: Dean Clarity, MD Authorized by: Dean Clarity, MD   Consent:    Consent obtained:  Verbal   Consent given by:  Patient   Risks discussed:  Bleeding and infection   Alternatives discussed:  No treatment Universal protocol:    Procedure explained and questions answered to patient or proxy's satisfaction: yes     Patient identity confirmed:  Verbally with patient Anesthesia:    Anesthesia method:  None Procedure details:    Treatment site:  L anterior and R anterior   Treatment method:  Silver  nitrate   Treatment episode: initial   Post-procedure details:    Assessment:  Bleeding stopped   Procedure completion:  Tolerated well, no immediate complications    Medications Ordered in the ED  silver  sulfADIAZINE  (SILVADENE ) 1 % cream ( Topical Given 03/13/24 1916)  oxymetazoline  (AFRIN) 0.05 % nasal spray 1 spray (1 spray Each Nare Given 03/13/24 1917)  silver  nitrate applicators 75-25 % applicator (  Given 03/13/24 1947)                                    Medical Decision Making Amount and/or Complexity of Data Reviewed Labs: ordered.  Risk OTC drugs. Prescription drug management.   This patient presents to the ED for concern of nosebleeds, this involves an extensive number of treatment options, and is a complaint that carries with it a high risk of complications and morbidity.  The differential diagnosis includes med rxn, low plt, dry nose   Co morbidities that complicate the patient evaluation  bipolar d/o, hep C, Parkinson's disease, and pituitary adenoma (s/p rsxn)   Additional history obtained:  Additional history obtained from epic chart review External records from outside source obtained and reviewed including wife   Lab Tests:  I Ordered, and personally interpreted labs.  The pertinent results include:  cbc nl, bmp nl other than k low at 3  Medicines ordered and prescription  drug management:  I have reviewed the patients home medicines and have made adjustments as needed   Problem List / ED Course:  Epistaxis:  resolved after silver  nitrate.  He is to use saline spray and consider a humidifier to keep nose moist.  He is to avoid Excedrin.  His NS put in  a referral to ENT.  He is to keep that appt.  Return if worse. Hypokalemia:  pt just started taking oral K.  He is to continue this.  He's given a list of foods high in K.   Reevaluation:  After the interventions noted above, I reevaluated the patient and found that they have :improved   Social Determinants of Health:  Lives at home   Dispostion:  After consideration of the diagnostic results and the patients response to treatment, I feel that the patent would benefit from discharge with outpatient f/u.       Final diagnoses:  Epistaxis  Hypokalemia    ED Discharge Orders     None          Dean Clarity, MD 03/13/24 2109  "

## 2024-03-13 NOTE — Discharge Instructions (Signed)
 Stop using the Excedrin.   Foods high in potassium: Fruits, such as dried apricots, prunes, raisins, orange juice, and bananas. Vegetables, such as acorn squash, potatoes, spinach, tomatoes, and broccoli. Lentils, kidney beans, soybeans, and nuts. Milk and yogurt. Meats, poultry, and fish.

## 2024-03-15 ENCOUNTER — Ambulatory Visit (HOSPITAL_COMMUNITY): Admitting: Occupational Therapy

## 2024-03-15 ENCOUNTER — Encounter (HOSPITAL_COMMUNITY): Payer: Self-pay | Admitting: Occupational Therapy

## 2024-03-15 DIAGNOSIS — R29818 Other symptoms and signs involving the nervous system: Secondary | ICD-10-CM

## 2024-03-15 DIAGNOSIS — R278 Other lack of coordination: Secondary | ICD-10-CM

## 2024-03-15 DIAGNOSIS — M25522 Pain in left elbow: Secondary | ICD-10-CM

## 2024-03-15 DIAGNOSIS — R29898 Other symptoms and signs involving the musculoskeletal system: Secondary | ICD-10-CM

## 2024-03-15 NOTE — Therapy (Signed)
 " OUTPATIENT OCCUPATIONAL THERAPY ORTHO TREATMENT NOTE  Patient Name: Bobby York MRN: [id number] DOB:[birth year], 69 y.o., male Today's Date: 03/15/2024  PCP: Marvine Rush, MD REFERRING PROVIDER: Lucillie Blades, MD  END OF SESSION:  OT End of Session - 03/15/24 1908     Visit Number 7    Number of Visits 12    Date for Recertification  03/30/24    Authorization Type 1)[employer] 2) [place]    Authorization Time Period Requesting 12 visits    Authorization - Visit Number 7    Authorization - Number of Visits 12    Progress Note Due on Visit 10    OT Start Time 1133    OT Stop Time 1212    OT Time Calculation (min) 39 min    Activity Tolerance Patient tolerated treatment well    Behavior During Therapy [facility] for tasks assessed/performed          Past Medical History:  Diagnosis Date   Bipolar 1 disorder (HCC)    diagnosed 2004   Dyspnea    Hepatitis    Hep C   Parkinson's disease (HCC)    Substance abuse (HCC) 2021   Past Surgical History:  Procedure Laterality Date   BACK SURGERY     two-lumbar disc x2   CARPAL TUNNEL WITH CUBITAL TUNNEL Left 11/29/2023   CLAVICLE SURGERY     COLONOSCOPY WITH PROPOFOL  N/A 07/25/2014   MFM:dpwhoz rectal polyp otherwise normal . hyperplastic polyp. TCS in 07/2024   CRANIOTOMY N/A 03/12/2022   Procedure: ENDOSCOPIC ENDONASAL TRANSPHENOIDAL RESECTION OF PITUITARY MASS;  Surgeon: Debby Dorn MATSU, MD;  Location: [facility] OR;  Service: Neurosurgery;  Laterality: N/A;   ESOPHAGEAL DILATION N/A 07/25/2014   Procedure: ESOPHAGEAL DILATION;  Surgeon: Lamar CHRISTELLA Hollingshead, MD;  Location: [facility] ORS;  Service: Endoscopy;  Laterality: N/A;  Malony dilators # 56,   ESOPHAGOGASTRODUODENOSCOPY (EGD) WITH PROPOFOL  N/A 07/25/2014   MFM:fpoi erosive reflux/s/p dilator/HH. +hpylor    NECK SURGERY     TRANSPHENOIDAL APPROACH EXPOSURE N/A 03/12/2022   Procedure: TRANSPHENOIDAL ENDOSCOPIC RESCTION OF PITUITARY ADENOMA;  Surgeon: Llewellyn Gerard LABOR, DO;  Location: [facility]  OR;  Service: ENT;  Laterality: N/A;   Patient Active Problem List   Diagnosis Date Noted   H/O partial hypophysectomy 06/21/2022   Pituitary mass 03/12/2022   Pituitary tumor 03/12/2022   DOE (dyspnea on exertion) 08/27/2021   Pneumothorax, traumatic 04/14/2016   H. pylori infection 08/22/2014   Reflux esophagitis    Dysphagia, pharyngoesophageal phase    Mucosal abnormality of stomach    History of colonic polyps    Chronic hepatitis C (HCC) 07/09/2014   Abnormal weight loss 07/09/2014   History of ETOH abuse 07/09/2014   Esophageal dysphagia 07/09/2014   Odynophagia 07/09/2014   Abnormal ECG 05/28/2014    ONSET DATE: 11/29/23  REFERRING DIAG: G56.20 (ICD-10-CM) - Cubital tunnel syndrome   THERAPY DIAG:  Pain in left elbow  Other lack of coordination  Other symptoms and signs involving the musculoskeletal system  Other symptoms and signs involving the nervous system  Rationale for Evaluation and Treatment: Rehabilitation  SUBJECTIVE:   SUBJECTIVE STATEMENT: S: It always hurts  PERTINENT HISTORY: Pt s/p L cubital tunnel release. PMH significant for Parkinson's.   PRECAUTIONS: None  WEIGHT BEARING RESTRICTIONS: No  PAIN:  Are you having pain? Yes: NPRS scale: 6/10 Pain location: L elbow Pain description: stabbing Aggravating factors: touch/pressure Relieving factors: aleve takes the edge off  FALLS: Has patient fallen in last  6 months? No  PLOF: Independent  PATIENT GOALS: To decrease pain  NEXT MD VISIT: 04/27/24  OBJECTIVE:  Note: Objective measures were completed at Evaluation unless otherwise noted.  HAND DOMINANCE: Right  ADLs: Overall ADLs: Pt unable to complete fine motor tasks, unable to manipulate buttons and zippers, as well as unable to don shoes and tie laces. Pt reports also being unable to fully reach up or out to grab light things, as he often drops them.   FUNCTIONAL OUTCOME MEASURES: Quick Dash: 86.36  UPPER EXTREMITY ROM:      Active ROM Left eval  Elbow flexion 135  Elbow extension 0  Wrist flexion 36  Wrist extension 37  Wrist ulnar deviation 22  Wrist radial deviation 8  Wrist pronation WFL  Wrist supination WFL  (Blank rows = not tested)  UPPER EXTREMITY MMT:     MMT Left eval  Elbow flexion 4+/5  Elbow extension 4/5  Wrist flexion 4/5  Wrist extension 4-/5  Wrist ulnar deviation 4-/5  Wrist radial deviation 4-/5  Wrist pronation 4-/5  Wrist supination 4-/5  (Blank rows = not tested)  HAND FUNCTION: Grip strength: Right: 24 lbs; Left: 39 lbs, Lateral pinch: Right: 3 lbs, Left: 4 lbs, and 3 point pinch: Right: 3 lbs, Left: 3 lbs  COORDINATION: 9 Hole Peg test: Right: 1'04 sec; Left: 42.46 sec  SENSATION: Mild decreased tactile sensation on the palmar aspect of L hand  OBSERVATIONS: moderate fascial restrictions noted along the lateral aspect of the elbow, forearm, and biceps.    TREATMENT DATE:   03/15/24 -Adjusted and fitted prefabricated elbow splint -Manual techniques: myofascial release and trigger point applied to medial and lateral aspect of elbow, forearm, and biceps in order to reduce pain and fascial restrictions as well as improve ROM. -Wrist Stretches: flexion, extension, ulnar deviation, 4x10 holds -Therabar: yellow, extension/flexion twists, supinated bends, pronated bends, x10 -Gripper: 37# and 42# large and medium beads. -Grooved peg board  03/12/24 -Manual techniques: myofascial release and trigger point applied to medial and lateral aspect of elbow, forearm, and biceps in order to reduce pain and fascial restrictions as well as improve ROM. -Wrist ROM: flexion, extension, ulnar/radial deviation, supination/pronation, x10 -A/ROM: Bicep curls, hammer curls, pronated curls, x10 -Digiflexion: 3#, full squeeze x10, each digit x6 -Pinch Tree: yellow, red, green resistance clips, tripod up, lateral down.  02/29/24 -Manual techniques: myofascial release and trigger  point applied to medial and lateral aspect of elbow, forearm, and biceps in order to reduce pain and fascial restrictions as well as improve ROM.  -US : Trialed US  to left elbow region to decrease pain, 1.5 w/cm2, 5'  -A/ROM: bicep curl, hammer curl, pronated, 10 reps -A/ROM: wrist-flexion, extension, ulnar/radial deviation, supination/pronation, 10 reps -Grip strengthening: hand gripper at 35#, vertical, large and medium beads -Pinch strengthening: pt using red clothespin to grasp and stack 3 towers of 5 sponges using 3 point pinch. Replaced into bucket with lateral pinch   PATIENT EDUCATION: Education details: Reviewed HEP Person educated: Patient Education method: Explanation, Demonstration, and Handouts Education comprehension: verbalized understanding and returned demonstration  HOME EXERCISE PROGRAM: 12/19: wrist and elbow ROM 1/22: Wrist stretches  GOALS: Goals reviewed with patient? Yes  SHORT TERM GOALS: Target date: 03/23/24  Pt will be provided and educated on LUE HEP for elbow and wrist mobility for ADL completion.   Goal status: IN PROGRESS  2.  Pt will decrease LUE pain to 3/10 or less in order to sleep for 3+ consecutive hours without  waking due to pain.   Goal status:  IN PROGRESS  3.  Pt will decrease LUE fascial restrictions to minimal amounts in order to reach out and overhead independently with dressing and bathing.   Goal status:  IN PROGRESS  4.  PT will increase L wrist ROM by 15* in order to manipulate bathing items.   Goal status:  IN PROGRESS  5.  Pt will increase LUE strength to 5/5 in order to lift and carry items during cooking tasks.   Goal status: IN PROGRESS  6.  Pt will increase LUE grip strength by 10# and pinch strength by 2# in order to grasp and manipulate small items during dressing.   Goal status:  IN PROGRESS  ASSESSMENT:  CLINICAL IMPRESSION: This session pt continues to report pain, however some improvements noted. OT assisted  with fitting a prefabricated splint, educating on proper placement, tightness, and ensure there were no noticeable pressure areas. OT then provided manual therapy, due to moderate restrictions along medial aspect of his elbow and forearm. Pt was then able to tolerate strengthening and mobility exercises, requiring 2 short rest breaks due to fatigue. OT providing verbal and tactile cuing for positioning and technique throughout session.   PERFORMANCE DEFICITS: in functional skills including ADLs, IADLs, coordination, ROM, strength, pain, fascial restrictions, muscle spasms, Fine motor control, Gross motor control, body mechanics, and UE functional use.   PLAN:  OT FREQUENCY: 2x/week  OT DURATION: 6 weeks  PLANNED INTERVENTIONS: 97168 OT Re-evaluation, 97535 self care/ADL training, 02889 therapeutic exercise, 97530 therapeutic activity, 97112 neuromuscular re-education, 97140 manual therapy, 97035 ultrasound, 97018 paraffin, 02989 moist heat, 97032 electrical stimulation (manual), passive range of motion, functional mobility training, energy conservation, coping strategies training, patient/family education, and DME and/or AE instructions  CONSULTED AND AGREED WITH PLAN OF CARE: Patient  PLAN FOR NEXT SESSION: Manual Therapy, Stretching, ROM, gripping   Valentin Nightingale, OTR/L [phone] 03/15/2024, 7:10 PM   "

## 2024-03-19 ENCOUNTER — Ambulatory Visit (HOSPITAL_COMMUNITY): Admitting: Occupational Therapy

## 2024-03-21 ENCOUNTER — Other Ambulatory Visit: Payer: Self-pay | Admitting: Neurology

## 2024-03-21 ENCOUNTER — Other Ambulatory Visit: Payer: Self-pay

## 2024-03-21 ENCOUNTER — Encounter: Payer: Self-pay | Admitting: Neurology

## 2024-03-21 MED ORDER — FLUDROCORTISONE ACETATE 0.1 MG PO TABS: 100.0000 ug | ORAL_TABLET | Freq: Two times a day (BID) | ORAL | 3 refills | Status: AC

## 2024-03-22 ENCOUNTER — Ambulatory Visit (HOSPITAL_COMMUNITY): Admitting: Occupational Therapy

## 2024-03-22 ENCOUNTER — Encounter (HOSPITAL_COMMUNITY): Payer: Self-pay | Admitting: Occupational Therapy

## 2024-03-22 DIAGNOSIS — R29898 Other symptoms and signs involving the musculoskeletal system: Secondary | ICD-10-CM

## 2024-03-22 DIAGNOSIS — R278 Other lack of coordination: Secondary | ICD-10-CM

## 2024-03-22 DIAGNOSIS — M25522 Pain in left elbow: Secondary | ICD-10-CM

## 2024-03-22 NOTE — Therapy (Signed)
 " OUTPATIENT OCCUPATIONAL THERAPY ORTHO TREATMENT NOTE  Patient Name: JAMESMICHAEL SHADD MRN: [id number] DOB:08-09-1955, 69 y.o., male Today's Date: 03/22/2024  PCP: Marvine Rush, MD REFERRING PROVIDER: Lucillie Blades, MD  END OF SESSION:  OT End of Session - 03/22/24 1230     Visit Number 8    Number of Visits 12    Date for Recertification  03/30/24    Authorization Type 1) [employer] 2) [facility]    Authorization Time Period No Auth    Progress Note Due on Visit 10    OT Start Time [phone]    OT Stop Time 1038    OT Time Calculation (min) 44 min    Activity Tolerance Patient tolerated treatment well    Behavior During Therapy [facility] for tasks assessed/performed          Past Medical History:  Diagnosis Date   Bipolar 1 disorder (HCC)    diagnosed 2004   Dyspnea    Hepatitis    Hep C   Parkinson's disease (HCC)    Substance abuse (HCC) 2021   Past Surgical History:  Procedure Laterality Date   BACK SURGERY     two-lumbar disc x2   CARPAL TUNNEL WITH CUBITAL TUNNEL Left 11/29/2023   CLAVICLE SURGERY     COLONOSCOPY WITH PROPOFOL  N/A 07/25/2014   MFM:dpwhoz rectal polyp otherwise normal . hyperplastic polyp. TCS in 07/2024   CRANIOTOMY N/A 03/12/2022   Procedure: ENDOSCOPIC ENDONASAL TRANSPHENOIDAL RESECTION OF PITUITARY MASS;  Surgeon: Debby Dorn MATSU, MD;  Location: [facility] OR;  Service: Neurosurgery;  Laterality: N/A;   ESOPHAGEAL DILATION N/A 07/25/2014   Procedure: ESOPHAGEAL DILATION;  Surgeon: Lamar CHRISTELLA Hollingshead, MD;  Location: [facility];  Service: Endoscopy;  Laterality: N/A;  Malony dilators # 56,   ESOPHAGOGASTRODUODENOSCOPY (EGD) WITH PROPOFOL  N/A 07/25/2014   MFM:fpoi erosive reflux/s/p dilator/HH. +hpylor    NECK SURGERY     TRANSPHENOIDAL APPROACH EXPOSURE N/A 03/12/2022   Procedure: TRANSPHENOIDAL ENDOSCOPIC RESCTION OF PITUITARY ADENOMA;  Surgeon: Llewellyn Gerard LABOR, DO;  Location: [facility] OR;  Service: ENT;  Laterality: N/A;   Patient Active Problem List   Diagnosis Date  Noted   H/O partial hypophysectomy 06/21/2022   Pituitary mass 03/12/2022   Pituitary tumor 03/12/2022   DOE (dyspnea on exertion) 08/27/2021   Pneumothorax, traumatic 04/14/2016   H. pylori infection 08/22/2014   Reflux esophagitis    Dysphagia, pharyngoesophageal phase    Mucosal abnormality of stomach    History of colonic polyps    Chronic hepatitis C (HCC) 07/09/2014   Abnormal weight loss 07/09/2014   History of ETOH abuse 07/09/2014   Esophageal dysphagia 07/09/2014   Odynophagia 07/09/2014   Abnormal ECG 05/28/2014    ONSET DATE: 11/29/23  REFERRING DIAG: G56.20 (ICD-10-CM) - Cubital tunnel syndrome   THERAPY DIAG:  Pain in left elbow  Other lack of coordination  Other symptoms and signs involving the musculoskeletal system  Rationale for Evaluation and Treatment: Rehabilitation  SUBJECTIVE:   SUBJECTIVE STATEMENT: S: It hurts when I put pressure on my elbow  PERTINENT HISTORY: Pt s/p L cubital tunnel release. PMH significant for Parkinson's.   PRECAUTIONS: None  WEIGHT BEARING RESTRICTIONS: No  PAIN:  Are you having pain? Yes: NPRS scale: 7/10 Pain location: L elbow Pain description: stabbing Aggravating factors: touch/pressure Relieving factors: aleve takes the edge off  FALLS: Has patient fallen in last 6 months? No  PLOF: Independent  PATIENT GOALS: To decrease pain  NEXT MD VISIT: 04/27/24  OBJECTIVE:  Note: Objective measures were completed at Evaluation unless otherwise noted.  HAND DOMINANCE: Right  ADLs: Overall ADLs: Pt unable to complete fine motor tasks, unable to manipulate buttons and zippers, as well as unable to don shoes and tie laces. Pt reports also being unable to fully reach up or out to grab light things, as he often drops them.   FUNCTIONAL OUTCOME MEASURES: Quick Dash: 86.36  UPPER EXTREMITY ROM:     Active ROM Left eval  Elbow flexion 135  Elbow extension 0  Wrist flexion 36  Wrist extension 37  Wrist ulnar  deviation 22  Wrist radial deviation 8  Wrist pronation WFL  Wrist supination WFL  (Blank rows = not tested)  UPPER EXTREMITY MMT:     MMT Left eval  Elbow flexion 4+/5  Elbow extension 4/5  Wrist flexion 4/5  Wrist extension 4-/5  Wrist ulnar deviation 4-/5  Wrist radial deviation 4-/5  Wrist pronation 4-/5  Wrist supination 4-/5  (Blank rows = not tested)  HAND FUNCTION: Grip strength: Right: 24 lbs; Left: 39 lbs, Lateral pinch: Right: 3 lbs, Left: 4 lbs, and 3 point pinch: Right: 3 lbs, Left: 3 lbs  COORDINATION: 9 Hole Peg test: Right: 1'04 sec; Left: 42.46 sec  SENSATION: Mild decreased tactile sensation on the palmar aspect of L hand  OBSERVATIONS: moderate fascial restrictions noted along the lateral aspect of the elbow, forearm, and biceps.    TREATMENT DATE:   03/22/24 -Manual techniques: myofascial release and trigger point applied to medial and lateral aspect of elbow, forearm, and biceps in order to reduce pain and fascial restrictions as well as improve ROM. -Strengthening: red band, bicep curls, pronated curls, hammer curls, x12 -Therabar: yellow, extension/flexion twists, supinated bends, pronated bends, x10 -Digiflexion: 7#, full squeeze x10, 3# each digit x6 -Gripper: 38# and 42# large and medium beads.  03/15/24 -Adjusted and fitted prefabricated elbow splint -Manual techniques: myofascial release and trigger point applied to medial and lateral aspect of elbow, forearm, and biceps in order to reduce pain and fascial restrictions as well as improve ROM. -Wrist Stretches: flexion, extension, ulnar deviation, 4x10 holds -Therabar: yellow, extension/flexion twists, supinated bends, pronated bends, x10 -Gripper: 37# and 42# large and medium beads. -Grooved peg board  03/12/24 -Manual techniques: myofascial release and trigger point applied to medial and lateral aspect of elbow, forearm, and biceps in order to reduce pain and fascial restrictions as well  as improve ROM. -Wrist ROM: flexion, extension, ulnar/radial deviation, supination/pronation, x10 -A/ROM: Bicep curls, hammer curls, pronated curls, x10 -Digiflexion: 3#, full squeeze x10, each digit x6 -Pinch Tree: yellow, red, green resistance clips, tripod up, lateral down.  PATIENT EDUCATION: Education details: Reviewed HEP Person educated: Patient Education method: Explanation, Demonstration, and Handouts Education comprehension: verbalized understanding and returned demonstration  HOME EXERCISE PROGRAM: 12/19: wrist and elbow ROM 1/22: Wrist stretches  GOALS: Goals reviewed with patient? Yes  SHORT TERM GOALS: Target date: 03/23/24  Pt will be provided and educated on LUE HEP for elbow and wrist mobility for ADL completion.   Goal status: IN PROGRESS  2.  Pt will decrease LUE pain to 3/10 or less in order to sleep for 3+ consecutive hours without waking due to pain.   Goal status:  IN PROGRESS  3.  Pt will decrease LUE fascial restrictions to minimal amounts in order to reach out and overhead independently with dressing and bathing.   Goal status:  IN PROGRESS  4.  PT will increase L wrist ROM by  15* in order to manipulate bathing items.   Goal status:  IN PROGRESS  5.  Pt will increase LUE strength to 5/5 in order to lift and carry items during cooking tasks.   Goal status: IN PROGRESS  6.  Pt will increase LUE grip strength by 10# and pinch strength by 2# in order to grasp and manipulate small items during dressing.   Goal status:  IN PROGRESS  ASSESSMENT:  CLINICAL IMPRESSION: Pt presented to session with increased pain and dizziness. He struggled with all exercises this session requiring multiple rest breaks. He was able to tolerate gripping exercises well with improving resistances. OT providing verbal and tactile cuing for positioning and technique.   PERFORMANCE DEFICITS: in functional skills including ADLs, IADLs, coordination, ROM, strength, pain,  fascial restrictions, muscle spasms, Fine motor control, Gross motor control, body mechanics, and UE functional use.   PLAN:  OT FREQUENCY: 2x/week  OT DURATION: 6 weeks  PLANNED INTERVENTIONS: 97168 OT Re-evaluation, 97535 self care/ADL training, 02889 therapeutic exercise, 97530 therapeutic activity, 97112 neuromuscular re-education, 97140 manual therapy, 97035 ultrasound, 97018 paraffin, 02989 moist heat, 97032 electrical stimulation (manual), passive range of motion, functional mobility training, energy conservation, coping strategies training, patient/family education, and DME and/or AE instructions  CONSULTED AND AGREED WITH PLAN OF CARE: Patient  PLAN FOR NEXT SESSION: Manual Therapy, Stretching, ROM, gripping   Valentin Nightingale, OTR/L [phone] 03/22/2024, 12:32 PM   "

## 2024-03-26 ENCOUNTER — Ambulatory Visit (HOSPITAL_COMMUNITY): Admitting: Occupational Therapy

## 2024-04-06 ENCOUNTER — Ambulatory Visit (HOSPITAL_COMMUNITY): Attending: Neurology | Admitting: Occupational Therapy

## 2024-06-04 ENCOUNTER — Ambulatory Visit: Admitting: Neurology

## 2024-12-31 ENCOUNTER — Ambulatory Visit: Admitting: "Endocrinology
